# Patient Record
Sex: Male | Born: 1965 | Race: White | Hispanic: No | Marital: Married | Smoking: Former smoker
Health system: Southern US, Community
[De-identification: ages and names within clinical notes are randomized; demographics above are authoritative.]

## PROBLEM LIST (undated history)

## (undated) DIAGNOSIS — R790 Abnormal level of blood mineral: Secondary | ICD-10-CM

## (undated) DIAGNOSIS — K219 Gastro-esophageal reflux disease without esophagitis: Secondary | ICD-10-CM

## (undated) DIAGNOSIS — J309 Allergic rhinitis, unspecified: Secondary | ICD-10-CM

## (undated) DIAGNOSIS — F32A Depression, unspecified: Secondary | ICD-10-CM

## (undated) DIAGNOSIS — A048 Other specified bacterial intestinal infections: Secondary | ICD-10-CM

## (undated) DIAGNOSIS — M79606 Pain in leg, unspecified: Secondary | ICD-10-CM

## (undated) DIAGNOSIS — E119 Type 2 diabetes mellitus without complications: Secondary | ICD-10-CM

## (undated) DIAGNOSIS — E785 Hyperlipidemia, unspecified: Secondary | ICD-10-CM

## (undated) DIAGNOSIS — M542 Cervicalgia: Secondary | ICD-10-CM

## (undated) DIAGNOSIS — E538 Deficiency of other specified B group vitamins: Secondary | ICD-10-CM

## (undated) DIAGNOSIS — I10 Essential (primary) hypertension: Secondary | ICD-10-CM

## (undated) DIAGNOSIS — F329 Major depressive disorder, single episode, unspecified: Secondary | ICD-10-CM

## (undated) DIAGNOSIS — IMO0001 Reserved for inherently not codable concepts without codable children: Secondary | ICD-10-CM

## (undated) DIAGNOSIS — E559 Vitamin D deficiency, unspecified: Secondary | ICD-10-CM

## (undated) DIAGNOSIS — E291 Testicular hypofunction: Secondary | ICD-10-CM

## (undated) HISTORY — DX: Other specified bacterial intestinal infections: A04.8

## (undated) HISTORY — DX: Depression, unspecified: F32.A

## (undated) HISTORY — DX: Deficiency of other specified B group vitamins: E53.8

## (undated) HISTORY — DX: Pain in leg, unspecified: M79.606

## (undated) HISTORY — DX: Abnormal level of blood mineral: R79.0

## (undated) HISTORY — DX: Allergic rhinitis, unspecified: J30.9

## (undated) HISTORY — DX: Vitamin D deficiency, unspecified: E55.9

## (undated) HISTORY — DX: Cervicalgia: M54.2

## (undated) HISTORY — PX: KNEE ARTHROSCOPY: SHX127

## (undated) HISTORY — DX: Essential (primary) hypertension: I10

## (undated) HISTORY — DX: Hyperlipidemia, unspecified: E78.5

## (undated) HISTORY — DX: Testicular hypofunction: E29.1

## (undated) HISTORY — DX: Major depressive disorder, single episode, unspecified: F32.9

## (undated) HISTORY — DX: Gastro-esophageal reflux disease without esophagitis: K21.9

## (undated) HISTORY — DX: Type 2 diabetes mellitus without complications: E11.9

## (undated) HISTORY — DX: Reserved for inherently not codable concepts without codable children: IMO0001

---

## 2000-03-15 ENCOUNTER — Encounter: Admission: RE | Admit: 2000-03-15 | Discharge: 2000-03-15 | Payer: Self-pay | Admitting: Family Medicine

## 2000-03-15 ENCOUNTER — Encounter: Payer: Self-pay | Admitting: Family Medicine

## 2001-01-10 ENCOUNTER — Encounter: Payer: Self-pay | Admitting: Family Medicine

## 2001-01-10 ENCOUNTER — Ambulatory Visit (HOSPITAL_COMMUNITY): Admission: RE | Admit: 2001-01-10 | Discharge: 2001-01-10 | Payer: Self-pay | Admitting: Family Medicine

## 2002-06-09 ENCOUNTER — Encounter: Payer: Self-pay | Admitting: Family Medicine

## 2002-06-09 ENCOUNTER — Encounter: Admission: RE | Admit: 2002-06-09 | Discharge: 2002-06-09 | Payer: Self-pay | Admitting: Family Medicine

## 2002-07-24 ENCOUNTER — Other Ambulatory Visit: Admission: RE | Admit: 2002-07-24 | Discharge: 2002-07-24 | Payer: Self-pay | Admitting: Family Medicine

## 2004-08-11 ENCOUNTER — Ambulatory Visit (HOSPITAL_COMMUNITY): Admission: RE | Admit: 2004-08-11 | Discharge: 2004-08-11 | Payer: Self-pay | Admitting: Orthopedic Surgery

## 2004-08-11 ENCOUNTER — Ambulatory Visit (HOSPITAL_BASED_OUTPATIENT_CLINIC_OR_DEPARTMENT_OTHER): Admission: RE | Admit: 2004-08-11 | Discharge: 2004-08-11 | Payer: Self-pay | Admitting: Orthopedic Surgery

## 2012-03-13 ENCOUNTER — Encounter: Payer: Self-pay | Admitting: *Deleted

## 2012-03-13 DIAGNOSIS — K219 Gastro-esophageal reflux disease without esophagitis: Secondary | ICD-10-CM | POA: Insufficient documentation

## 2012-03-13 DIAGNOSIS — E785 Hyperlipidemia, unspecified: Secondary | ICD-10-CM | POA: Insufficient documentation

## 2012-04-04 ENCOUNTER — Ambulatory Visit: Payer: Self-pay | Admitting: Physician Assistant

## 2012-07-02 ENCOUNTER — Other Ambulatory Visit: Payer: Self-pay | Admitting: Physician Assistant

## 2012-07-04 NOTE — Telephone Encounter (Signed)
Medication refilled per protocol.Patient needs to be seen before any further refills 

## 2012-09-20 ENCOUNTER — Encounter: Payer: Self-pay | Admitting: Family Medicine

## 2012-09-21 ENCOUNTER — Other Ambulatory Visit: Payer: BC Managed Care – PPO

## 2012-09-28 ENCOUNTER — Ambulatory Visit (INDEPENDENT_AMBULATORY_CARE_PROVIDER_SITE_OTHER): Payer: BC Managed Care – PPO | Admitting: Physician Assistant

## 2012-09-28 ENCOUNTER — Encounter: Payer: Self-pay | Admitting: Physician Assistant

## 2012-09-28 VITALS — BP 110/64 | HR 68 | Temp 98.0°F | Resp 16 | Ht 71.0 in | Wt 223.0 lb

## 2012-09-28 DIAGNOSIS — K219 Gastro-esophageal reflux disease without esophagitis: Secondary | ICD-10-CM

## 2012-09-28 DIAGNOSIS — Z0000 Encounter for general adult medical examination without abnormal findings: Secondary | ICD-10-CM

## 2012-09-28 DIAGNOSIS — F32A Depression, unspecified: Secondary | ICD-10-CM | POA: Insufficient documentation

## 2012-09-28 DIAGNOSIS — F329 Major depressive disorder, single episode, unspecified: Secondary | ICD-10-CM

## 2012-09-28 DIAGNOSIS — E119 Type 2 diabetes mellitus without complications: Secondary | ICD-10-CM

## 2012-09-28 DIAGNOSIS — E559 Vitamin D deficiency, unspecified: Secondary | ICD-10-CM

## 2012-09-28 DIAGNOSIS — E785 Hyperlipidemia, unspecified: Secondary | ICD-10-CM

## 2012-09-28 NOTE — Progress Notes (Signed)
Patient ID: Chad Haynes. MRN: [id number], DOB: 12-Apr-1965 47 y.o. Date of Encounter: 09/28/2012, 7:20 PM    Chief Complaint: Physical (CPE)  HPI: 47 y.o. y/o white male here for CPE.  He has no complaints today. Has been feeling pretty good.  He is part of a research trial through [facility]. For his diabetes. This includes a diet of 1500 calories per day. In May of 2014 he may have received what is called Endo Barrier- - or he may have received a placebo-- he does not know. He explains that the Endo Barrier is a sleeve  they put down his esophagus and then it goes into his upper intestine. This prevents calories and sugar from being absorbed. Again he does not know whether he truly got that or placebo.  He is on a 1500-calorie diet. He has lost 32 pounds in less than one year. We don't yet know whether this weight loss is secondary to the calorie diet or that  Plus Endo barrier.  [facility] is managing his diabetes. Therefore I have not been assessing this since he started the research there.  At his lab in September 2013 his LDL was suboptimal his simvastatin was increased to 80 mg. He does state that he is taking this dose and he did increase as directed. He is having no myalgias and no other adverse effects.  He is still taking the Paxil 20 mg daily. Says that his mood is very stable. He has had no problems feeling depressed. He says that he has been on this medication for 12 years.    Review of Systems: Consitutional: No fever, chills, fatigue, night sweats, lymphadenopathy, or weight changes. Eyes: No visual changes, eye redness, or discharge. ENT/Mouth: Ears: No otalgia, tinnitus, hearing loss, discharge. Nose: No congestion, rhinorrhea, sinus pain, or epistaxis. Throat: No sore throat, post nasal drip, or teeth pain. Cardiovascular: No CP, palpitations, diaphoresis, DOE, edema, orthopnea, PND. Respiratory: No cough, hemoptysis, SOB, or wheezing. Gastrointestinal: No anorexia,  dysphagia, reflux, pain, nausea, vomiting, hematemesis, diarrhea, constipation, BRBPR, or melena. Genitourinary: No dysuria, frequency, urgency, hematuria, incontinence, nocturia, decreased urinary stream, discharge, impotence, or testicular pain/masses. Musculoskeletal: No decreased ROM, myalgias, stiffness, joint swelling, or weakness. Skin: No rash, erythema, lesion changes, pain, warmth, jaundice, or pruritis. Neurological: No headache, dizziness, syncope, seizures, tremors, memory loss, coordination problems, or paresthesias. Psychological: No anxiety, depression, hallucinations, SI/HI. Endocrine: No fatigue, polydipsia, polyphagia, polyuria. All other systems were reviewed and are otherwise negative.  Past Medical History  Diagnosis Date  . Rhinitis, allergic   . GERD (gastroesophageal reflux disease)   . White coat hypertension   . Neck pain   . Leg pain   . Diabetes mellitus without complication   . Vitamin D deficiency   . Hyperlipidemia   . Hypogonadism male   . H. pylori infection   . Depression      Past Surgical History  Procedure Laterality Date  . Knee arthroscopy Right     Home Meds:  Current Outpatient Prescriptions on File Prior to Visit  Medication Sig Dispense Refill  . lisinopril (PRINIVIL,ZESTRIL) 5 MG tablet Take 5 mg by mouth daily.      . metFORMIN (GLUCOPHAGE) 1000 MG tablet Take 1,000 mg by mouth 2 (two) times daily with a meal.      . omeprazole-sodium bicarbonate (ZEGERID) 40-1100 MG per capsule Take 1 capsule by mouth daily before breakfast.      . PARoxetine (PAXIL) 20 MG tablet TAKE ONE TABLET  BY MOUTH EVERY DAY  90 tablet  0  . simvastatin (ZOCOR) 80 MG tablet Take 80 mg by mouth at bedtime.       No current facility-administered medications on file prior to visit.    Allergies: No Known Allergies  History   Social History  . Marital Status: Married    Spouse Name: N/A    Number of Children: N/A  . Years of Education: N/A    Occupational History  . Not on file.   Social History Main Topics  . Smoking status: Never Smoker   . Smokeless tobacco: Not on file  . Alcohol Use: No  . Drug Use: Not on file  . Sexual Activity: Not on file   Other Topics Concern  . Not on file   Social History Narrative   Married. No children. Does not formal exercise.   In school at [employer] full-time. Works part-time job at night.   "Has to walk a mile from the car to classic [employer]"    Family History  Problem Relation Age of Onset  . COPD Mother     smoker  . Hypertension Father     Physical Exam: Blood pressure 110/64, pulse 68, temperature 98 F (36.7 C), temperature source Oral, resp. rate 16, height 5\' 11"  (1.803 m), weight 223 lb (101.152 kg).  General: Well developed, well nourished, white male. in no acute distress. HEENT: Normocephalic, atraumatic. Conjunctiva pink, sclera non-icteric. Pupils 2 mm constricting to 1 mm, round, regular, and equally reactive to light and accomodation. EOMI. Internal auditory canal clear. TMs with good cone of light and without pathology. Nasal mucosa pink. Nares are without discharge. No sinus tenderness. Oral mucosa pink.  Pharynx without exudate.   Neck: Supple. Trachea midline. No thyromegaly. Full ROM. No lymphadenopathy. No carotid bruit. Lungs: Clear to auscultation bilaterally without wheezes, rales, or rhonchi. Breathing is of normal effort and unlabored. Cardiovascular: RRR with S1 S2. No murmurs, rubs, or gallops. Distal pulses 2+ symmetrically. No carotid or abdominal bruits. Abdomen: Soft, non-tender, non-distended with normoactive bowel sounds. No hepatosplenomegaly or masses. No rebound/guarding. No CVA tenderness. No hernias. Musculoskeletal: Full range of motion and 5/5 strength throughout. Without swelling, atrophy, tenderness, crepitus, or warmth. Extremities without clubbing, cyanosis, or edema. Calves supple. Skin: Warm and moist without erythema, ecchymosis, wounds,  or rash. Neuro: A+Ox3. CN II-XII grossly intact. Moves all extremities spontaneously. Full sensation throughout. Normal gait. DTR 2+ throughout upper and lower extremities. Finger to nose intact. Psych:  Responds to questions appropriately with a normal affect.   Assessment/Plan:  47 y.o. y/o white male here for CPE -1. Visit for preventive health examination A. Labs: On 07/08/12 he had labs through [facility]. This included CMET which was normal. Lipid panel, vitamin B12, vitamin D and A1c  2. Diabetes mellitus without complication Being managed at [facility] secondary to research trial  3. Hyperlipidemia September 13 LDL was suboptimal simvastatin was increased to 80 mg. Lipid panel on 07/08/12 showed: Total cholesterol 98 HDL 29 LDL 56 triglyceride 101. In addition to increasing simvastatin to 80 mg the patient has also lost significant weight as above secondary to his low-calorie diet.(+/- EndoBarrier). If he is able to continue this diet long term then we can probably decrease his simvastatin dose in the future.  4. Depression Well-controlled on current medication  5. GERD (gastroesophageal reflux disease) Symptoms are controlled  6. Unspecified vitamin D deficiency He is on vitamin D   A. Screening Labs:  B. Screening For Prostate  Cancer: We'll start at age 61  C. Screening For Colorectal Cancer: We'll start at age 10  D. Immunizations: Flu----patient deferred Tetanus--this should have been updated with his surgery recently. As well he said he had a finger laceration 4 years ago and as it was updated at that time. Pneumococcal:  We'll discuss this with patient at the next office visit. Given that he has diabetes he should go ahead and have a DTaP. However not sure if this has been given a [facility]. Zostaax: At age 64  Signed:   [address] [place], [place]  09/28/2012 7:20 PM

## 2012-10-12 ENCOUNTER — Other Ambulatory Visit: Payer: Self-pay | Admitting: Physician Assistant

## 2012-10-12 NOTE — Telephone Encounter (Signed)
Medication refilled per protocol. 

## 2012-11-15 ENCOUNTER — Telehealth: Payer: Self-pay | Admitting: *Deleted

## 2012-11-15 DIAGNOSIS — E119 Type 2 diabetes mellitus without complications: Secondary | ICD-10-CM

## 2012-11-15 MED ORDER — METFORMIN HCL 1000 MG PO TABS: 1000.0000 mg | ORAL_TABLET | Freq: Two times a day (BID) | ORAL | Status: DC

## 2012-11-15 NOTE — Telephone Encounter (Signed)
Metformin refill

## 2012-12-04 ENCOUNTER — Encounter: Payer: Self-pay | Admitting: Family Medicine

## 2012-12-04 DIAGNOSIS — R790 Abnormal level of blood mineral: Secondary | ICD-10-CM

## 2012-12-04 DIAGNOSIS — E538 Deficiency of other specified B group vitamins: Secondary | ICD-10-CM | POA: Insufficient documentation

## 2012-12-13 ENCOUNTER — Telehealth: Payer: Self-pay | Admitting: Family Medicine

## 2012-12-13 MED ORDER — SIMVASTATIN 80 MG PO TABS: 80.0000 mg | ORAL_TABLET | Freq: Every day | ORAL | Status: DC

## 2012-12-13 NOTE — Telephone Encounter (Signed)
zocor refill

## 2012-12-15 ENCOUNTER — Telehealth: Payer: Self-pay | Admitting: Physician Assistant

## 2012-12-15 NOTE — Telephone Encounter (Signed)
PT wife is calling about her husband prescription she [facility] not [facility] us but he is out of his medication  [facility] [place]  Call back number is [phone]

## 2012-12-16 ENCOUNTER — Ambulatory Visit: Payer: BC Managed Care – PPO

## 2013-01-04 ENCOUNTER — Other Ambulatory Visit: Payer: Self-pay | Admitting: Family Medicine

## 2013-01-06 NOTE — Telephone Encounter (Signed)
Medication refilled per protocol. 

## 2013-01-16 ENCOUNTER — Telehealth: Payer: Self-pay | Admitting: Physician Assistant

## 2013-01-16 MED ORDER — LISINOPRIL 5 MG PO TABS: 5.0000 mg | ORAL_TABLET | Freq: Every day | ORAL | Status: DC

## 2013-01-16 MED ORDER — GLIMEPIRIDE 4 MG PO TABS: 8.0000 mg | ORAL_TABLET | Freq: Every day | ORAL | Status: DC

## 2013-01-16 NOTE — Telephone Encounter (Signed)
Medication refilled per protocol. 

## 2013-01-16 NOTE — Telephone Encounter (Signed)
Pharmacy [facility] [address] back number is [phone] Pt is needing a refill on lisinopril, and glimepiride

## 2013-01-25 ENCOUNTER — Encounter: Payer: Self-pay | Admitting: Physician Assistant

## 2013-01-25 ENCOUNTER — Ambulatory Visit (INDEPENDENT_AMBULATORY_CARE_PROVIDER_SITE_OTHER): Payer: BC Managed Care – PPO | Admitting: Physician Assistant

## 2013-01-25 VITALS — BP 118/70 | HR 100 | Temp 98.6°F | Resp 20 | Wt 222.0 lb

## 2013-01-25 DIAGNOSIS — D518 Other vitamin B12 deficiency anemias: Secondary | ICD-10-CM

## 2013-01-25 DIAGNOSIS — R531 Weakness: Secondary | ICD-10-CM

## 2013-01-25 DIAGNOSIS — R5383 Other fatigue: Secondary | ICD-10-CM

## 2013-01-25 DIAGNOSIS — D519 Vitamin B12 deficiency anemia, unspecified: Secondary | ICD-10-CM

## 2013-01-25 DIAGNOSIS — R5381 Other malaise: Secondary | ICD-10-CM

## 2013-01-25 DIAGNOSIS — R509 Fever, unspecified: Secondary | ICD-10-CM

## 2013-01-25 LAB — CBC W/MCH & 3 PART DIFF
HCT: 38.2 % — ABNORMAL LOW (ref 39.0–52.0)
Hemoglobin: 12.3 g/dL — ABNORMAL LOW (ref 13.0–17.0)
LYMPHS ABS: 1.2 10*3/uL (ref 0.7–4.0)
LYMPHS PCT: 9 % — AB (ref 12–46)
MCH: 28 pg (ref 26.0–34.0)
MCHC: 32.2 g/dL (ref 30.0–36.0)
MCV: 86.8 fL (ref 78.0–100.0)
NEUTROS PCT: 87 % — AB (ref 43–77)
Neutro Abs: 12.1 10*3/uL — ABNORMAL HIGH (ref 1.7–7.7)
PLATELETS: 419 10*3/uL — AB (ref 150–400)
RBC: 4.4 MIL/uL (ref 4.22–5.81)
RDW: 14.4 % (ref 11.5–15.5)
WBC mixed population %: 5 % (ref 3–18)
WBC mixed population: 0.6 10*3/uL (ref 0.1–1.8)
WBC: 13.9 10*3/uL — AB (ref 4.0–10.5)

## 2013-01-25 LAB — INFLUENZA A AND B
INFLUENZA B AG: NEGATIVE
Inflenza A Ag: NEGATIVE

## 2013-01-25 MED ORDER — CYANOCOBALAMIN 1000 MCG/ML IJ SOLN
1000.0000 ug | Freq: Once | INTRAMUSCULAR | Status: AC
  Administered 2013-01-25: 1000 ug via INTRAMUSCULAR

## 2013-01-25 NOTE — Progress Notes (Signed)
Patient ID: Chad Haynes. MRN: [id number], DOB: 02-06-1965, 48 y.o. Date of Encounter: 01/25/2013, 2:59 PM    Chief Complaint:  Chief Complaint  Patient presents with  . poss anemic from implant    req CBC,  been sick with cold sx off/on, stomach pain, fever 3 weeks  . Injections    B12     HPI: 48 y.o. year old white male is here with his wife. Chad Haynes has been in a research study at [facility] for quite some time now. They have put in something called an EndoBarrier--for Diabetes.  Patient has called to [facility] and is informed them of his recent symptoms and illness. They told him to come here. They are to then call [facility] with the results found here and then may be also following up at [facility] today or tomorrow. [facility] has been doing blood work on a frequent basis as part of the research study. Patient and wife report that this has shown B12 deficiency in the past and he was supposed to be coming here to get B12 injections. However he had totally forgotten about coming for the injections and has not been getting the B12 injections. He says that he definitely needs to get a B12 injection while he is here today. As well, and he does not know whether the B12 deficiency is contributing to his current symptoms. He reports that he has been sick off and on for several weeks now.  Specifically, they report that he has had sweats at night versus the past 3 nights. Last night the wife checked his temperature and got 103.1. He states that he is just feeling tired and weak and just does not feel well in general.  He had some sinus congestion and cough a couple weeks ago but that has completely cleared and resolved. He also reports that he has had some vague abdominal discomfort for months but he thinks this is secondary to the [employer]	[place]. He has had no increased or changed abdominal pain over the past week. They report that [facility] told him to  come here and get a CBC and a flu test and his B12 injection. They are to  then call [facility] and report the findings of these to them and then followup with [facility].     Home Meds: See attached medication section for any medications that were entered at today's visit. The computer does not put those onto this list.The following list is a list of meds entered prior to today's visit.   Current Outpatient Prescriptions on File Prior to Visit  Medication Sig Dispense Refill  . Ascorbic Acid (VITAMIN C) 100 MG tablet Take 100 mg by mouth daily.      . Cholecalciferol (VITAMIN D) 400 UNITS capsule Take 400 Units by mouth daily.      . ferrous fumarate (HEMOCYTE - 106 MG FE) 325 (106 FE) MG TABS tablet Take 1 tablet by mouth.      Marland Kitchen lisinopril (PRINIVIL,ZESTRIL) 5 MG tablet Take 1 tablet (5 mg total) by mouth daily.  90 tablet  1  . PARoxetine (PAXIL) 20 MG tablet Take 1 tablet (20 mg total) by mouth daily.  90 tablet  1  . simvastatin (ZOCOR) 80 MG tablet Take 1 tablet (80 mg total) by mouth at bedtime.  90 tablet  1  . vitamin B-12 (CYANOCOBALAMIN) 100 MCG tablet Take 50 mcg by mouth daily.      . Magnesium Oxide 400 MG CAPS Take 800  mg by mouth daily.       No current facility-administered medications on file prior to visit.    Allergies: No Known Allergies    Review of Systems: See HPI for pertinent ROS. All other ROS negative.    Physical Exam: Blood pressure 118/70, pulse 100, temperature 98.6 F (37 C), temperature source Oral, resp. rate 20, weight 222 lb (100.699 kg)., Body mass index is 30.98 kg/(m^2). General: WNWD WM.  Appears in no acute distress. HEENT: Normocephalic, atraumatic, eyes without discharge, sclera non-icteric, nares are without discharge. Bilateral auditory canals clear, TM's are without perforation, pearly grey and translucent with reflective cone of light bilaterally. Oral cavity moist, posterior pharynx without exudate, erythema, peritonsillar abscess, or post nasal drip.  Neck: Supple. No thyromegaly. No lymphadenopathy. Lungs: Clear  bilaterally to auscultation without wheezes, rales, or rhonchi. Breathing is unlabored. Heart: Regular rhythm. No murmurs, rubs, or gallops. Abdomen: Soft,  non-distended with normoactive bowel sounds. No hepatomegaly. No rebound/guarding. No obvious abdominal masses. Mild diffuse tenderness but no areas of focal localized severe tenderness. Msk:  Strength and tone normal for age. Extremities/Skin: Warm and dry. No clubbing or cyanosis. No edema. No rashes or suspicious lesions. Neuro: Alert and oriented X 3. Moves all extremities spontaneously. Gait is normal. CNII-XII grossly in tact. Psych:  Responds to questions appropriately with a normal affect.   Results for orders placed in visit on 01/25/13  INFLUENZA A AND B      Result Value Range   Source-INFBD NASAL     Inflenza A Ag NEG  Negative   Influenza B Ag NEG  Negative  CBC [facility] & 3 PART DIFF      Result Value Range   WBC 13.9 (*) 4.0 - 10.5 K/uL   RBC 4.40  4.22 - 5.81 MIL/uL   Hemoglobin 12.3 (*) 13.0 - 17.0 g/dL   HCT 38.2 (*) 39.0 - 52.0 %   MCV 86.8  78.0 - 100.0 fL   MCH 28.0  26.0 - 34.0 pg   MCHC 32.2  30.0 - 36.0 g/dL   RDW 14.4  11.5 - 15.5 %   Platelets 419 (*) 150 - 400 K/uL   Neutrophils Relative % 87 (*) 43 - 77 %   Neutro Abs 12.1 (*) 1.7 - 7.7 K/uL   Lymphocytes Relative 9 (*) 12 - 46 %   Lymphs Abs 1.2  0.7 - 4.0 K/uL   WBC mixed population % 5  3 - 18 %   WBC mixed population 0.6  0.1 - 1.8 K/uL     ASSESSMENT AND PLAN:  48 y.o. year old male with  1. Fever - Influenza a and b - CBC w/[facility] & 3 Part Diff  2. Weakness - Influenza a and b - CBC w/[facility] & 3 Part Diff  3. Fatigue - Influenza a and b - CBC w/[facility] & 3 Part Diff  4. B12 deficiency anemia We'll find documentation of this B12 deficiency and then proceed with injection.  I discussed lab results with patient and his wife. I discussed need for evaluating his symptoms and CBC findings. However, because of his research study, they plan to  follow up with [facility]. Patient's wife has already called [facility] on her cellphone from the office and has plans for followup with them.   Marin Olp [place], [place], [facility] 01/25/2013 2:59 PM

## 2013-05-16 ENCOUNTER — Other Ambulatory Visit: Payer: Self-pay | Admitting: Physician Assistant

## 2013-05-16 NOTE — Telephone Encounter (Signed)
Refill appropriate and filled per protocol. 

## 2013-05-22 ENCOUNTER — Telehealth: Payer: Self-pay | Admitting: Physician Assistant

## 2013-05-22 MED ORDER — SIMVASTATIN 80 MG PO TABS: 80.0000 mg | ORAL_TABLET | Freq: Every day | ORAL | Status: DC

## 2013-05-22 MED ORDER — LISINOPRIL 5 MG PO TABS: 5.0000 mg | ORAL_TABLET | Freq: Every day | ORAL | Status: DC

## 2013-05-22 MED ORDER — PAROXETINE HCL 20 MG PO TABS: 20.0000 mg | ORAL_TABLET | Freq: Every day | ORAL | Status: DC

## 2013-05-22 NOTE — Telephone Encounter (Signed)
Medication refilled per protocol. 

## 2013-05-22 NOTE — Telephone Encounter (Signed)
PATIENT IS CALLING TO GET REFILLS ON HIS ZOCOR,PAXIL AND LISINOPRIL, AND HE WOULD LIKE TO CHANGE PHARMACY TO [facility] SAYS THAT [facility] RUN AROUND  [phone]

## 2013-07-22 ENCOUNTER — Other Ambulatory Visit: Payer: Self-pay | Admitting: *Deleted

## 2013-07-22 DIAGNOSIS — E119 Type 2 diabetes mellitus without complications: Secondary | ICD-10-CM

## 2013-07-22 DIAGNOSIS — E785 Hyperlipidemia, unspecified: Secondary | ICD-10-CM

## 2013-07-27 ENCOUNTER — Other Ambulatory Visit: Payer: BC Managed Care – PPO

## 2013-07-27 DIAGNOSIS — E119 Type 2 diabetes mellitus without complications: Secondary | ICD-10-CM

## 2013-07-27 DIAGNOSIS — E785 Hyperlipidemia, unspecified: Secondary | ICD-10-CM

## 2013-07-27 LAB — CBC WITH DIFFERENTIAL/PLATELET
Basophils Absolute: 0 10*3/uL (ref 0.0–0.1)
Basophils Relative: 0 % (ref 0–1)
EOS ABS: 0.5 10*3/uL (ref 0.0–0.7)
Eosinophils Relative: 6 % — ABNORMAL HIGH (ref 0–5)
HCT: 41.5 % (ref 39.0–52.0)
Hemoglobin: 14.7 g/dL (ref 13.0–17.0)
LYMPHS PCT: 24 % (ref 12–46)
Lymphs Abs: 1.8 10*3/uL (ref 0.7–4.0)
MCH: 29.5 pg (ref 26.0–34.0)
MCHC: 35.4 g/dL (ref 30.0–36.0)
MCV: 83.2 fL (ref 78.0–100.0)
Monocytes Absolute: 0.5 10*3/uL (ref 0.1–1.0)
Monocytes Relative: 7 % (ref 3–12)
Neutro Abs: 4.8 10*3/uL (ref 1.7–7.7)
Neutrophils Relative %: 63 % (ref 43–77)
PLATELETS: 303 10*3/uL (ref 150–400)
RBC: 4.99 MIL/uL (ref 4.22–5.81)
RDW: 13.9 % (ref 11.5–15.5)
WBC: 7.6 10*3/uL (ref 4.0–10.5)

## 2013-07-27 LAB — COMPLETE METABOLIC PANEL WITH GFR
ALK PHOS: 94 U/L (ref 39–117)
ALT: 36 U/L (ref 0–53)
AST: 18 U/L (ref 0–37)
Albumin: 4.3 g/dL (ref 3.5–5.2)
BILIRUBIN TOTAL: 0.7 mg/dL (ref 0.2–1.2)
BUN: 17 mg/dL (ref 6–23)
CHLORIDE: 102 meq/L (ref 96–112)
CO2: 23 mEq/L (ref 19–32)
CREATININE: 0.72 mg/dL (ref 0.50–1.35)
Calcium: 9.2 mg/dL (ref 8.4–10.5)
GFR, Est African American: >89 mL/min
GFR, Est Non African American: >89 mL/min
Glucose, Bld: 158 mg/dL — ABNORMAL HIGH (ref 70–99)
Potassium: 4.5 mEq/L (ref 3.5–5.3)
Sodium: 138 mEq/L (ref 135–145)
Total Protein: 6.9 g/dL (ref 6.0–8.3)

## 2013-07-27 LAB — LIPID PANEL
Cholesterol: 136 mg/dL (ref 0–200)
HDL: 37 mg/dL — AB (ref >39)
LDL Cholesterol: 70 mg/dL (ref 0–99)
TRIGLYCERIDES: 145 mg/dL (ref <150)
Total CHOL/HDL Ratio: 3.7 Ratio
VLDL: 29 mg/dL (ref 0–40)

## 2013-07-29 LAB — HEMOGLOBIN A1C
Hgb A1c MFr Bld: 7.4 % — ABNORMAL HIGH (ref <5.7)
MEAN PLASMA GLUCOSE: 166 mg/dL — AB (ref <117)

## 2013-08-27 ENCOUNTER — Other Ambulatory Visit: Payer: Self-pay | Admitting: Physician Assistant

## 2013-08-28 NOTE — Telephone Encounter (Signed)
Refill appropriate and filled per protocol. 

## 2013-10-02 ENCOUNTER — Encounter: Payer: BC Managed Care – PPO | Admitting: Physician Assistant

## 2013-10-04 ENCOUNTER — Encounter: Payer: BC Managed Care – PPO | Admitting: Physician Assistant

## 2013-10-16 ENCOUNTER — Encounter: Payer: BC Managed Care – PPO | Admitting: Physician Assistant

## 2013-11-22 ENCOUNTER — Other Ambulatory Visit: Payer: Self-pay | Admitting: Family Medicine

## 2013-11-22 ENCOUNTER — Other Ambulatory Visit: Payer: Self-pay | Admitting: Physician Assistant

## 2013-11-22 ENCOUNTER — Encounter: Payer: Self-pay | Admitting: Family Medicine

## 2013-11-22 MED ORDER — METFORMIN HCL 1000 MG PO TABS: 1000.0000 mg | ORAL_TABLET | Freq: Two times a day (BID) | ORAL | Status: DC

## 2013-11-22 NOTE — Telephone Encounter (Signed)
Medication refill for one time only.  Patient needs to be seen.  Letter sent for patient to call and schedule 

## 2013-12-20 ENCOUNTER — Encounter: Payer: Self-pay | Admitting: Family Medicine

## 2013-12-20 ENCOUNTER — Other Ambulatory Visit: Payer: Self-pay | Admitting: Physician Assistant

## 2013-12-20 NOTE — Telephone Encounter (Signed)
Medication refill for one time only.  Patient needs to be seen.  Letter sent for patient to call and schedule 

## 2014-01-01 ENCOUNTER — Ambulatory Visit: Payer: BC Managed Care – PPO | Admitting: Physician Assistant

## 2014-01-17 ENCOUNTER — Ambulatory Visit (INDEPENDENT_AMBULATORY_CARE_PROVIDER_SITE_OTHER): Payer: BLUE CROSS/BLUE SHIELD | Admitting: Physician Assistant

## 2014-01-17 ENCOUNTER — Encounter: Payer: Self-pay | Admitting: Physician Assistant

## 2014-01-17 VITALS — BP 116/86 | HR 88 | Temp 98.2°F | Resp 18 | Ht 70.5 in | Wt 249.0 lb

## 2014-01-17 DIAGNOSIS — Z23 Encounter for immunization: Secondary | ICD-10-CM | POA: Diagnosis not present

## 2014-01-17 DIAGNOSIS — F329 Major depressive disorder, single episode, unspecified: Secondary | ICD-10-CM

## 2014-01-17 DIAGNOSIS — E785 Hyperlipidemia, unspecified: Secondary | ICD-10-CM | POA: Diagnosis not present

## 2014-01-17 DIAGNOSIS — E559 Vitamin D deficiency, unspecified: Secondary | ICD-10-CM | POA: Diagnosis not present

## 2014-01-17 DIAGNOSIS — E119 Type 2 diabetes mellitus without complications: Secondary | ICD-10-CM | POA: Diagnosis not present

## 2014-01-17 DIAGNOSIS — K219 Gastro-esophageal reflux disease without esophagitis: Secondary | ICD-10-CM | POA: Diagnosis not present

## 2014-01-17 DIAGNOSIS — F32A Depression, unspecified: Secondary | ICD-10-CM

## 2014-01-17 LAB — LIPID PANEL
CHOLESTEROL: 143 mg/dL (ref 0–200)
HDL: 35 mg/dL — ABNORMAL LOW (ref >39)
LDL Cholesterol: 80 mg/dL (ref 0–99)
Total CHOL/HDL Ratio: 4.1 Ratio
Triglycerides: 140 mg/dL (ref <150)
VLDL: 28 mg/dL (ref 0–40)

## 2014-01-17 LAB — COMPLETE METABOLIC PANEL WITH GFR
ALT: 24 U/L (ref 0–53)
AST: 18 U/L (ref 0–37)
Albumin: 4.2 g/dL (ref 3.5–5.2)
Alkaline Phosphatase: 99 U/L (ref 39–117)
BILIRUBIN TOTAL: 0.7 mg/dL (ref 0.2–1.2)
BUN: 16 mg/dL (ref 6–23)
CALCIUM: 9.4 mg/dL (ref 8.4–10.5)
CO2: 22 mEq/L (ref 19–32)
Chloride: 102 mEq/L (ref 96–112)
Creat: 0.61 mg/dL (ref 0.50–1.35)
GLUCOSE: 197 mg/dL — AB (ref 70–99)
POTASSIUM: 4.5 meq/L (ref 3.5–5.3)
SODIUM: 138 meq/L (ref 135–145)
Total Protein: 7.3 g/dL (ref 6.0–8.3)

## 2014-01-17 NOTE — Progress Notes (Signed)
Patient ID: Chad Haynes. MRN: [id number], DOB: 1965-11-03 49 y.o. Date of Encounter: 01/17/2014, 9:57 AM    Chief Complaint: Routine follow-up office visit  HPI: 49 y.o. y/o white male here for routine follow-up office visit.    He has no complaints today. Has been feeling pretty good.  He wa part of a research trial through [facility]. It was research for what is called Endo Barrier. Was researching this as a possible treatment option for Diabetes.  He had explained to me  that the Endo Barrier is a sleeve  they put down his esophagus and then it goes into his upper intestine. This prevents calories and sugar from being absorbed. He was on a 1500-calorie diet. He did lose weight with this but we didn't  know whether this weight loss was secondary to the calorie diet or that  Plus Endo barrier.  His last office visit with me was 01/25/13. At that time he came in because of fever and other symptoms. 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  His last visit with me prior to that was for physical exam 09/28/12. Even at that time I was not evaluating his diabetes because that was being done as part of the research at [facility]. However, today patient states that he is still taking all of the same medications for diabetes as he was when I was last prescribing medications.  He is taking diabetic medications and cholesterol medications etc. as listed on the medicine list today. He is having no adverse effects. He is still taking the Paxil 20 mg daily. Says that his mood is very stable. He has had no problems feeling depressed. He says that he has been on this medication for 14 years.  He says that he does not check his blood sugar. Says that he tries to be pretty careful with his diet. Says that he and his wife joined [employer] about one month  ago and they goes there 3 or 4 times per week and he does the elliptical and the bike and a little bit of weights.    Review of Systems: Consitutional: No fever, chills, fatigue, night sweats, lymphadenopathy, or weight changes. Eyes: No visual changes, eye redness, or discharge. ENT/Mouth: Ears: No otalgia, tinnitus, hearing loss, discharge. Nose: No congestion, rhinorrhea, sinus pain, or epistaxis. Throat: No sore throat, post nasal drip, or teeth pain. Cardiovascular: No CP, palpitations, diaphoresis, DOE, edema, orthopnea, PND. Respiratory: No cough, hemoptysis, SOB, or wheezing. Gastrointestinal: No anorexia, dysphagia, reflux, pain, nausea, vomiting, hematemesis, diarrhea, constipation, BRBPR, or melena. Genitourinary: No dysuria, frequency, urgency, hematuria, incontinence, nocturia, decreased urinary stream, discharge, impotence, or testicular pain/masses. Musculoskeletal: No decreased ROM, myalgias, stiffness, joint swelling, or weakness. Skin: No rash, erythema, lesion changes, pain, warmth, jaundice, or pruritis. Neurological: No headache, dizziness, syncope, seizures, tremors, memory loss, coordination problems, or paresthesias. Psychological: No anxiety, depression, hallucinations, SI/HI. Endocrine: No fatigue, polydipsia, polyphagia, polyuria. All other systems were reviewed and are otherwise negative.  Past Medical History  Diagnosis Date  . Rhinitis, allergic   . GERD (gastroesophageal reflux disease)   . White coat hypertension   . Neck pain   . Leg pain   . Diabetes mellitus without complication   . Vitamin D deficiency   . Hyperlipidemia   . Hypogonadism male   . H. pylori infection   .  Depression   . Low magnesium levels   . Vitamin B12 deficiency      Past Surgical History  Procedure Laterality Date  . Knee arthroscopy Right     Home Meds:  Current Outpatient Prescriptions on File Prior to Visit  Medication Sig Dispense Refill  . Ascorbic Acid (VITAMIN  C) 100 MG tablet Take 100 mg by mouth daily.    . Cholecalciferol (VITAMIN D) 400 UNITS capsule Take 400 Units by mouth daily.    Marland Kitchen glimepiride (AMARYL) 2 MG tablet TAKE 1 TABLET EVERY DAY 30 tablet 3  . lisinopril (PRINIVIL,ZESTRIL) 5 MG tablet TAKE 1 TABLET (5 MG TOTAL) BY MOUTH DAILY. 30 tablet 0  . metFORMIN (GLUCOPHAGE) 1000 MG tablet Take 1 tablet (1,000 mg total) by mouth 2 (two) times daily with a meal. 60 tablet 0  . PARoxetine (PAXIL) 20 MG tablet TAKE 1 TABLET (20 MG TOTAL) BY MOUTH DAILY. 30 tablet 0  . simvastatin (ZOCOR) 80 MG tablet TAKE 1 TABLET (80 MG TOTAL) BY MOUTH AT BEDTIME. 30 tablet 0   No current facility-administered medications on file prior to visit.    Allergies: No Known Allergies  History   Social History  . Marital Status: Married    Spouse Name: N/A    Number of Children: N/A  . Years of Education: N/A   Occupational History  . Not on file.   Social History Main Topics  . Smoking status: Never Smoker   . Smokeless tobacco: Not on file  . Alcohol Use: No  . Drug Use: Not on file  . Sexual Activity: Not on file   Other Topics Concern  . Not on file   Social History Narrative   Married. No children. Does not formal exercise.   In school at [employer] full-time. Works part-time job at night.   "Has to walk a mile from the car to classic [place]"    Family History  Problem Relation Age of Onset  . COPD Mother     smoker  . Hypertension Father     Physical Exam: Blood pressure 116/86, pulse 88, temperature 98.2 F (36.8 C), temperature source Oral, resp. rate 18, height 5' 10.5" (1.791 m), weight 249 lb (112.946 kg).  General: Well developed, well nourished, white male. in no acute distress. HEENT: Normocephalic, atraumatic. Conjunctiva pink, sclera non-icteric. Pupils 2 mm constricting to 1 mm, round, regular, and equally reactive to light and accomodation. EOMI. Internal auditory canal clear. TMs with good cone of light and without pathology.  Nasal mucosa pink. Nares are without discharge. No sinus tenderness. Oral mucosa pink.  Pharynx without exudate.   Neck: Supple. Trachea midline. No thyromegaly. Full ROM. No lymphadenopathy. No carotid bruit. Lungs: Clear to auscultation bilaterally without wheezes, rales, or rhonchi. Breathing is of normal effort and unlabored. Cardiovascular: RRR with S1 S2. No murmurs, rubs, or gallops. Distal pulses 2+ symmetrically. No carotid or abdominal bruits. Abdomen: Soft, non-tender, non-distended with normoactive bowel sounds. No hepatosplenomegaly or masses. No rebound/guarding. No CVA tenderness. No hernias. Musculoskeletal: Full range of motion and 5/5 strength throughout. Without swelling, atrophy, tenderness, crepitus, or warmth. Extremities without clubbing, cyanosis, or edema. Calves supple. Skin: Warm and moist without erythema, ecchymosis, wounds, or rash. Neuro: A+Ox3. CN II-XII grossly intact. Moves all extremities spontaneously. Full sensation throughout. Normal gait. DTR 2+ throughout upper and lower extremities. Finger to nose intact. Psych:  Responds to questions appropriately with a normal affect.   Assessment/Plan:  49 y.o. y/o white male here  for   1. Diabetes mellitus without complication - COMPLETE METABOLIC PANEL WITH GFR - Hemoglobin A1c  NEEDS MICROALBUMIN AT NEXT OV  Pneumovax 23 given here 01/17/2014  He is on ACE inhibitor He is on statin  NOT ON ASPIRIN---NEED TO DISCUSS ASA AT NEXT OV  2. Hyperlipidemia He is on statin. Recheck lab now. - COMPLETE METABOLIC PANEL WITH GFR - Lipid panel  3. Gastroesophageal reflux disease, esophagitis presence not specified - COMPLETE METABOLIC PANEL WITH GFR  4. Depression - COMPLETE METABOLIC PANEL WITH GFR  5. Vitamin D deficiency - COMPLETE METABOLIC PANEL WITH GFR - Vit D  25 hydroxy (rtn osteoporosis monitoring)  6. Need for prophylactic vaccination against Streptococcus pneumoniae (pneumococcus) - Pneumococcal  polysaccharide vaccine 23-valent greater than or equal to 2yo subcutaneous/IM    HE HAD COMPLETE PHYSICAL EXAM WITH ME 09/28/2012. THE FOLLOWING IS COPIED FROM THAT NOTE:   A. Screening Labs:  B. Screening For Prostate Cancer: We'll start at age 51  C. Screening For Colorectal Cancer: We'll start at age 73  D. Immunizations: Flu----patient deferred Tetanus--He reports he had a  finger laceration in approx year 2000--Tetanus was updated at that time--per pt. Pneumococcal:  Reports that he was given no pneumonia vaccine at [facility].Given his diabetes he needs to have Pneumovax 23. He is agreeable to receive this so this was given here 01/17/2014 Zostaax: At age 5  Signed:   [address] [place], [place]  01/17/2014 9:57 AM

## 2014-01-18 LAB — HEMOGLOBIN A1C
Hgb A1c MFr Bld: 8.3 % — ABNORMAL HIGH (ref <5.7)
Mean Plasma Glucose: 192 mg/dL — ABNORMAL HIGH (ref <117)

## 2014-01-18 LAB — VITAMIN D 25 HYDROXY (VIT D DEFICIENCY, FRACTURES): Vit D, 25-Hydroxy: 24 ng/mL — ABNORMAL LOW (ref 30–100)

## 2014-01-19 ENCOUNTER — Telehealth: Payer: Self-pay | Admitting: Family Medicine

## 2014-01-19 MED ORDER — PIOGLITAZONE HCL 45 MG PO TABS: 45.0000 mg | ORAL_TABLET | Freq: Every day | ORAL | Status: DC

## 2014-01-19 NOTE — Telephone Encounter (Signed)
-----   Message from Orlena Sheldon, PA-C sent at 01/18/2014  7:41 AM EST ----- 1--add Actos 45 mg 1 by mouth daily--- just give #30+2 refills so that he will come back in 3 months. 2--- increase vitamin D to 2000 units daily 3--- make sure to have follow-up office visit in 3 months. Do not wait longer than this so that we can closely monitor and adjust this.

## 2014-01-19 NOTE — Telephone Encounter (Signed)
Pt aware of lab results and provider recommendations.  Vitamin D change to med list.  Actos to pharmacy.  Pt has 3 month visit and told KEEP appointment

## 2014-01-23 LAB — HM DIABETES EYE EXAM

## 2014-01-26 ENCOUNTER — Other Ambulatory Visit: Payer: Self-pay | Admitting: Physician Assistant

## 2014-01-26 NOTE — Telephone Encounter (Signed)
Medication refilled per protocol. 

## 2014-01-28 ENCOUNTER — Other Ambulatory Visit: Payer: Self-pay | Admitting: Physician Assistant

## 2014-01-29 NOTE — Telephone Encounter (Signed)
Medication refilled per protocol. 

## 2014-01-30 ENCOUNTER — Telehealth: Payer: Self-pay | Admitting: Family Medicine

## 2014-01-30 NOTE — Telephone Encounter (Signed)
Wife wanted to clarify new medication changes from last visit.  All medications clarified with her and questions answered

## 2014-02-05 ENCOUNTER — Encounter: Payer: Self-pay | Admitting: Family Medicine

## 2014-03-04 ENCOUNTER — Other Ambulatory Visit: Payer: Self-pay | Admitting: Physician Assistant

## 2014-03-05 NOTE — Telephone Encounter (Signed)
Discuss with patient. He had been in a trial at [facility] and was having his diabetes managed at [facility] for a long time. He has had one office visit with me since he has been out of that trial--and I am now the one managing his diabetes. Talk to patient and find out how he is currently dosing this medication.

## 2014-03-05 NOTE — Telephone Encounter (Signed)
Have left pt message to call back to clarify dose of glimepiride

## 2014-03-05 NOTE — Telephone Encounter (Signed)
We have glimepiride 2 mg daily on med list.  This is for Glimepiride 4 mg "two" tabs a day????

## 2014-03-07 NOTE — Telephone Encounter (Signed)
I called patient.  He was confused also.  States he is only taking one Glimepiride a day.  He not sure of strength.  Told him to call me back when he can look at bottle he has at home.  He said may have thrown is away already bit will look.  LRF of the 2 mg Glimepiride was 08/28/13 #30 + 3.  Was on 4mg  dose per past med list prior to that date.

## 2014-03-09 NOTE — Telephone Encounter (Signed)
Glimepiride refill denied due to discrepancy in dose.  Pt was to call me back and has not.  He didn't know what dose he was on.  Refill refused until can clarify.

## 2014-03-21 ENCOUNTER — Other Ambulatory Visit: Payer: Self-pay | Admitting: Physician Assistant

## 2014-03-22 NOTE — Telephone Encounter (Signed)
Pt needs to call office for refill of glimepiride.  Conflict in dosage

## 2014-03-27 MED ORDER — GLIMEPIRIDE 4 MG PO TABS: 4.0000 mg | ORAL_TABLET | Freq: Every day | ORAL | Status: DC

## 2014-03-27 NOTE — Telephone Encounter (Signed)
Wife has now finally called back.  Confirmed pt on 4 mg glimepiride once daily.  Refill sent.  Confirmed with wife follow up appt for early April.  Med list updated

## 2014-04-23 ENCOUNTER — Ambulatory Visit: Payer: BLUE CROSS/BLUE SHIELD | Admitting: Physician Assistant

## 2014-04-27 ENCOUNTER — Other Ambulatory Visit: Payer: Self-pay | Admitting: Physician Assistant

## 2014-04-27 NOTE — Telephone Encounter (Signed)
Medication refilled per protocol. 

## 2014-04-30 ENCOUNTER — Encounter: Payer: Self-pay | Admitting: Family Medicine

## 2014-04-30 ENCOUNTER — Ambulatory Visit (INDEPENDENT_AMBULATORY_CARE_PROVIDER_SITE_OTHER): Payer: BLUE CROSS/BLUE SHIELD | Admitting: Family Medicine

## 2014-04-30 VITALS — BP 128/74 | HR 76 | Temp 98.1°F | Resp 14 | Ht 70.5 in | Wt 248.0 lb

## 2014-04-30 DIAGNOSIS — E119 Type 2 diabetes mellitus without complications: Secondary | ICD-10-CM

## 2014-04-30 DIAGNOSIS — R350 Frequency of micturition: Secondary | ICD-10-CM | POA: Diagnosis not present

## 2014-04-30 DIAGNOSIS — E669 Obesity, unspecified: Secondary | ICD-10-CM | POA: Diagnosis not present

## 2014-04-30 LAB — COMPREHENSIVE METABOLIC PANEL
ALK PHOS: 83 U/L (ref 39–117)
ALT: 23 U/L (ref 0–53)
AST: 18 U/L (ref 0–37)
Albumin: 4.2 g/dL (ref 3.5–5.2)
BILIRUBIN TOTAL: 0.5 mg/dL (ref 0.2–1.2)
BUN: 13 mg/dL (ref 6–23)
CO2: 26 mEq/L (ref 19–32)
CREATININE: 0.66 mg/dL (ref 0.50–1.35)
Calcium: 9.5 mg/dL (ref 8.4–10.5)
Chloride: 104 mEq/L (ref 96–112)
Glucose, Bld: 202 mg/dL — ABNORMAL HIGH (ref 70–99)
Potassium: 4.4 mEq/L (ref 3.5–5.3)
SODIUM: 139 meq/L (ref 135–145)
TOTAL PROTEIN: 7.1 g/dL (ref 6.0–8.3)

## 2014-04-30 LAB — URINALYSIS, MICROSCOPIC ONLY
Bacteria, UA: NONE SEEN
Casts: NONE SEEN
Crystals: NONE SEEN

## 2014-04-30 LAB — URINALYSIS, ROUTINE W REFLEX MICROSCOPIC
Bilirubin Urine: NEGATIVE
GLUCOSE, UA: 500 mg/dL — AB
Leukocytes, UA: NEGATIVE
Nitrite: NEGATIVE
PH: 5.5 (ref 5.0–8.0)
Protein, ur: NEGATIVE mg/dL
Specific Gravity, Urine: 1.02 (ref 1.005–1.030)
Urobilinogen, UA: 1 mg/dL (ref 0.0–1.0)

## 2014-04-30 LAB — HEMOGLOBIN A1C
Hgb A1c MFr Bld: 10.6 % — ABNORMAL HIGH (ref <5.7)
MEAN PLASMA GLUCOSE: 258 mg/dL — AB (ref <117)

## 2014-04-30 NOTE — Patient Instructions (Signed)
We will call with lab and culture results Continue your current medicaitons Work on healthier eating,no fried foods  F/U 3 months

## 2014-04-30 NOTE — Assessment & Plan Note (Addendum)
Uncontrolled diabetes mellitus. We discussed dietary changes. He is also to increase his water intake. I'll titrate his medications so that his A1c is less than 7%. He is on statin drugs his cholesterol is well controlled this was checked 12 weeks ago. He is also on ACE inhibitor.

## 2014-04-30 NOTE — Progress Notes (Signed)
Patient ID: Chad Pod., male   DOB: 12-18-1965, 49 y.o.   MRN: [id number]   Subjective:    Patient ID: Chad Pod., male    DOB: February 26, 1965, 49 y.o.   MRN: [id number]  Patient presents for Urinary Frequency and F/U DM  Patient follow-up diabetes mellitus. His diabetes has been uncontrolled his last A1c 8.3%. He states that he started drinking diet soda but he eats out every day and he has gained about 20 pounds over the past 4-5 months. For the past few days he's had some urinary frequency and some mild discomfort with urination. He denies any blood in the urine. He feels like he is not emptying his bladder all the way. He has not had any problems with constipation he has not had any abnormal drainage from the urethra. He does not drink very much water. He did not bring his meter with him today but states that he is taking his medication as prescribed which is Amaryl as well as metformin 1000 mg twice a day   Review Of Systems:  GEN- denies fatigue, fever, weight loss,weakness, recent illness HEENT- denies eye drainage, change in vision, nasal discharge, CVS- denies chest pain, palpitations RESP- denies SOB, cough, wheeze ABD- denies N/V, change in stools, abd pain GU- + dysuria, hematuria, dribbling, incontinence MSK- denies joint pain, muscle aches, injury Neuro- denies headache, dizziness, syncope, seizure activity       Objective:    BP 128/74 mmHg  Pulse 76  Temp(Src) 98.1 F (36.7 C) (Oral)  Resp 14  Ht 5' 10.5" (1.791 m)  Wt 248 lb (112.492 kg)  BMI 35.07 kg/m2 GEN- NAD, alert and oriented x3 HEENT- PERRL, EOMI, non injected sclera, pink conjunctiva, MMM, oropharynx clear CVS- RRR, no murmur RESP-CTAB ABD-NABS,soft,NT,ND, no cva tenderness EXT- No edema Pulses- Radial,  2+        Assessment & Plan:      Problem List Items Addressed This Visit    Diabetes mellitus without complication   Relevant Orders   Comprehensive metabolic panel   Hemoglobin  A1c   Microalbumin / creatinine urine ratio    Other Visit Diagnoses    Urinary frequency    -  Primary    UA no sign of infection but defintely elevated CBG, will send for culture, possible BPH but no gradual onset to symptoms,will treat diabetes first    Relevant Orders    Urinalysis, Routine w reflex microscopic (Completed)    Urine culture       Note: This dictation was prepared with Dragon dictation along with smaller phrase technology. Any transcriptional errors that result from this process are unintentional.

## 2014-05-01 LAB — URINE CULTURE
Colony Count: NO GROWTH
ORGANISM ID, BACTERIA: NO GROWTH

## 2014-05-01 LAB — MICROALBUMIN / CREATININE URINE RATIO
Creatinine, Urine: 130.5 mg/dL
MICROALB UR: 1.7 mg/dL (ref <2.0)
Microalb Creat Ratio: 13 mg/g (ref 0.0–30.0)

## 2014-05-03 ENCOUNTER — Ambulatory Visit: Payer: BLUE CROSS/BLUE SHIELD | Admitting: Physician Assistant

## 2014-05-07 ENCOUNTER — Telehealth: Payer: Self-pay | Admitting: Family Medicine

## 2014-05-07 NOTE — Telephone Encounter (Signed)
Wants to know what results of urine test.  Says it still feels "funny" when he urinates.  Also the Invokana has given him diarrhea and abd discomfort.  Please advise.

## 2014-05-07 NOTE — Telephone Encounter (Signed)
I called wife back and told her provider recommendations

## 2014-05-07 NOTE — Telephone Encounter (Signed)
Urine culture was negative from 1 week ago, it is in the chart He can d/c the invokana if diarrhea does not improve after another week, he needs to call and let us know, if not we will need to try an injectable medication

## 2014-05-09 ENCOUNTER — Telehealth: Payer: Self-pay | Admitting: *Deleted

## 2014-05-09 DIAGNOSIS — R350 Frequency of micturition: Secondary | ICD-10-CM

## 2014-05-09 NOTE — Telephone Encounter (Signed)
Pt wife called stating that pt is still having "wierd sensations" when he urinates says it just feels funny. When he was here last he left a specimen and came back normal, pt wants to know if you can refer him to urologist. Please advise!

## 2014-05-10 NOTE — Telephone Encounter (Signed)
Yes. Urology referral approved.

## 2014-05-10 NOTE — Telephone Encounter (Signed)
Urology referral placed

## 2014-05-11 ENCOUNTER — Telehealth: Payer: Self-pay | Admitting: Physician Assistant

## 2014-05-11 NOTE — Telephone Encounter (Signed)
Wife wants antibiotic.  Told urine culture was negative no need for antibiotics.  Husband still with "strange sensation" when voids.  Wondering if he need prostate check, family history with his father.  Does not want to wait for Urology appt.  Appt made for him next week for prostate check.

## 2014-05-11 NOTE — Telephone Encounter (Signed)
Patients wife calling to say that she knows he has referral in place for urologist, but would like to know if we can call in medication for the uti, and maybe he would not even have to to urologist?    [phone]  [employer]

## 2014-05-14 ENCOUNTER — Ambulatory Visit (INDEPENDENT_AMBULATORY_CARE_PROVIDER_SITE_OTHER): Payer: BLUE CROSS/BLUE SHIELD | Admitting: Family Medicine

## 2014-05-14 ENCOUNTER — Encounter: Payer: Self-pay | Admitting: Family Medicine

## 2014-05-14 VITALS — BP 118/78 | HR 84 | Temp 98.1°F | Resp 16 | Ht 70.0 in | Wt 242.0 lb

## 2014-05-14 DIAGNOSIS — N410 Acute prostatitis: Secondary | ICD-10-CM

## 2014-05-14 MED ORDER — CIPROFLOXACIN HCL 500 MG PO TABS: 500.0000 mg | ORAL_TABLET | Freq: Two times a day (BID) | ORAL | Status: DC

## 2014-05-14 NOTE — Progress Notes (Signed)
Subjective:    Patient ID: Chad Pod., male    DOB: 12/05/1965, 49 y.o.   MRN: [id number]  HPI Patient was seen on April 18 complaining of polyuria. At that time urinalysis revealed no evidence of infection. A urine culture was normal. However the patient's hemoglobin A1c was elevated at 10.5. Patient states that he has been doing a better job recently and his blood sugars are typically between 130 and 150. However he continues to have polyuria. He states that sometimes he has to go to the bathroom up to 20 times in a day. He is also having discomfort with urination. He is reporting some hesitancy as well. He is reporting frequency. On examination today the patient does have a slightly swollen prostate which is mildly tender to palpation. There is no prostate nodularity Past Medical History  Diagnosis Date  . Rhinitis, allergic   . GERD (gastroesophageal reflux disease)   . White coat hypertension   . Neck pain   . Leg pain   . Diabetes mellitus without complication   . Vitamin D deficiency   . Hyperlipidemia   . Hypogonadism male   . H. pylori infection   . Depression   . Low magnesium levels   . Vitamin B12 deficiency    Past Surgical History  Procedure Laterality Date  . Knee arthroscopy Right    Current Outpatient Prescriptions on File Prior to Visit  Medication Sig Dispense Refill  . glimepiride (AMARYL) 4 MG tablet Take 1 tablet (4 mg total) by mouth daily before breakfast. 30 tablet 1  . glucose blood (BAYER CONTOUR NEXT TEST) test strip by Other route Two (2) times a day.    . lisinopril (PRINIVIL,ZESTRIL) 5 MG tablet TAKE 1 TABLET (5 MG TOTAL) BY MOUTH DAILY. 30 tablet 0  . metFORMIN (GLUCOPHAGE) 1000 MG tablet TAKE 1 TABLET (1,000 MG TOTAL) BY MOUTH 2 (TWO) TIMES DAILY WITH A MEAL. 60 tablet 0  . omeprazole (PRILOSEC) 40 MG capsule Take 40 mg by mouth daily.    Marland Kitchen PARoxetine (PAXIL) 20 MG tablet TAKE 1 TABLET BY MOUTH EVERY DAY 30 tablet 0  . simvastatin (ZOCOR) 80  MG tablet TAKE 1 TABLET (80 MG TOTAL) BY MOUTH AT BEDTIME. 30 tablet 0   No current facility-administered medications on file prior to visit.   No Known Allergies History   Social History  . Marital Status: Married    Spouse Name: N/A  . Number of Children: N/A  . Years of Education: N/A   Occupational History  . Not on file.   Social History Main Topics  . Smoking status: Never Smoker   . Smokeless tobacco: Never Used  . Alcohol Use: 0.0 oz/week    0 Standard drinks or equivalent per week     Comment: occasionally  . Drug Use: No  . Sexual Activity: Yes   Other Topics Concern  . Not on file   Social History Narrative   Married. No children. Does not formal exercise.   In school at [employer] full-time. Works part-time job at night.   "Has to walk a mile from the car to classic [place]"      Review of Systems  All other systems reviewed and are negative.      Objective:   Physical Exam  Cardiovascular: Normal rate, regular rhythm and normal heart sounds.   Pulmonary/Chest: Effort normal and breath sounds normal.  Genitourinary: Prostate is enlarged and tender.  Vitals reviewed.  Assessment & Plan:  Prostatitis, acute - Plan: ciprofloxacin (CIPRO) 500 MG tablet, PSA  Symptoms may be consistent with prostatitis. Begin Cipro 500 mg by mouth twice a day for 14 days. Patient has just had a negative urine culture. Also explained to the patient that hyperglycemia can cause polyuria but he insists that his blood sugars are 100-150 and this would not cause polyuria. Recheck in 2 weeks if no better or sooner if worse I will also check a PSA.

## 2014-05-15 ENCOUNTER — Ambulatory Visit: Payer: Self-pay | Admitting: Family Medicine

## 2014-05-15 LAB — PSA: PSA: 0.97 ng/mL (ref <=4.00)

## 2014-05-16 ENCOUNTER — Encounter: Payer: Self-pay | Admitting: Family Medicine

## 2014-05-17 ENCOUNTER — Telehealth: Payer: Self-pay | Admitting: Physician Assistant

## 2014-05-17 NOTE — Telephone Encounter (Signed)
Patients wife aware of results.

## 2014-05-17 NOTE — Telephone Encounter (Signed)
Patient's wife calling to see if the results from Chad Haynes's prostate test exam were in  [phone]

## 2014-05-29 ENCOUNTER — Other Ambulatory Visit: Payer: Self-pay | Admitting: Physician Assistant

## 2014-05-29 NOTE — Telephone Encounter (Signed)
1 month refill until appt.06/08/14

## 2014-06-03 ENCOUNTER — Other Ambulatory Visit: Payer: Self-pay | Admitting: Physician Assistant

## 2014-06-04 NOTE — Telephone Encounter (Signed)
Medication refilled per protocol. 

## 2014-06-08 ENCOUNTER — Encounter: Payer: Self-pay | Admitting: Family Medicine

## 2014-06-08 ENCOUNTER — Ambulatory Visit (INDEPENDENT_AMBULATORY_CARE_PROVIDER_SITE_OTHER): Payer: BLUE CROSS/BLUE SHIELD | Admitting: Family Medicine

## 2014-06-08 VITALS — BP 128/78 | HR 68 | Temp 97.6°F | Resp 16 | Ht 70.0 in | Wt 242.0 lb

## 2014-06-08 DIAGNOSIS — E669 Obesity, unspecified: Secondary | ICD-10-CM | POA: Diagnosis not present

## 2014-06-08 DIAGNOSIS — E119 Type 2 diabetes mellitus without complications: Secondary | ICD-10-CM | POA: Diagnosis not present

## 2014-06-08 LAB — CBC WITH DIFFERENTIAL/PLATELET
BASOS PCT: 1 % (ref 0–1)
Basophils Absolute: 0.1 10*3/uL (ref 0.0–0.1)
EOS PCT: 4 % (ref 0–5)
Eosinophils Absolute: 0.2 10*3/uL (ref 0.0–0.7)
HCT: 44.2 % (ref 39.0–52.0)
Hemoglobin: 14.6 g/dL (ref 13.0–17.0)
Lymphocytes Relative: 34 % (ref 12–46)
Lymphs Abs: 2 10*3/uL (ref 0.7–4.0)
MCH: 28.3 pg (ref 26.0–34.0)
MCHC: 33 g/dL (ref 30.0–36.0)
MCV: 85.8 fL (ref 78.0–100.0)
MPV: 9 fL (ref 8.6–12.4)
Monocytes Absolute: 0.5 10*3/uL (ref 0.1–1.0)
Monocytes Relative: 8 % (ref 3–12)
Neutro Abs: 3.1 10*3/uL (ref 1.7–7.7)
Neutrophils Relative %: 53 % (ref 43–77)
Platelets: 320 10*3/uL (ref 150–400)
RBC: 5.15 MIL/uL (ref 4.22–5.81)
RDW: 14 % (ref 11.5–15.5)
WBC: 5.9 10*3/uL (ref 4.0–10.5)

## 2014-06-08 MED ORDER — CANAGLIFLOZIN 100 MG PO TABS: 100.0000 mg | ORAL_TABLET | Freq: Every day | ORAL | Status: DC

## 2014-06-08 NOTE — Assessment & Plan Note (Signed)
Blood sugars have improved, will continue him on Invokana , along with amaryl and Metformin Return 8 weeks for A1C and lipids Continue to work on weight loss and healthy eating CBC done today as overdue

## 2014-06-08 NOTE — Patient Instructions (Signed)
We will call with CBC results Restart the invokana F/U 2 months - fasting labs

## 2014-06-08 NOTE — Progress Notes (Signed)
Patient ID: Chad Pod., male   DOB: [birth year], 49 y.o.   MRN: [id number]   Subjective:    Patient ID: Chad Pod., male    DOB: May 11, 1965, 49 y.o.   MRN: [id number]  Patient presents for DM F/U  Pt here to f/u DM - interim visit, no concerns, A1C 10.6%. He is on MTF, amaryl , I added Invokana 100mg  after labs returned, he was not checking CBG previously, but now fastaing are between  100-150, he had 1 episode of hypoglycemia CBG was 80.  After a few meals CBG was 200-300 .  He did run out of Invokana samples 2 days ago, no side effects with medications. He also requested CBC, history of Leukocytosis after some complications from some medical trial at [facility]:  GEN- denies fatigue, fever, weight loss,weakness, recent illness HEENT- denies eye drainage, change in vision, nasal discharge, CVS- denies chest pain, palpitations RESP- denies SOB, cough, wheeze ABD- denies N/V, change in stools, abd pain GU- denies dysuria, hematuria, dribbling, incontinence MSK- denies joint pain, muscle aches, injury Neuro- denies headache, dizziness, syncope, seizure activity       Objective:    BP 128/78 mmHg  Pulse 68  Temp(Src) 97.6 F (36.4 C) (Oral)  Resp 16  Ht 5\' 10"  (1.778 m)  Wt 242 lb (109.77 kg)  BMI 34.72 kg/m2 GEN- NAD, alert and oriented x3 CVS- RRR, no murmur RESP-CTAB EXT- No edema Pulses- Radial, DP- 2+        Assessment & Plan:      Problem List Items Addressed This Visit    None      Note: This dictation was prepared with Dragon dictation along with smaller phrase technology. Any transcriptional errors that result from this process are unintentional.

## 2014-06-28 ENCOUNTER — Telehealth: Payer: Self-pay | Admitting: *Deleted

## 2014-06-28 NOTE — Telephone Encounter (Signed)
Received patient Wellness Exam Form.   MD completed form and states that patient required to come in to office to have waist circumference measured. Patient also needs to sign form before it can be faxed.   Call placed to patient and patient wife made aware. Patient arrived in office and form completed.   Faxed to insurance.

## 2014-06-29 ENCOUNTER — Other Ambulatory Visit: Payer: Self-pay | Admitting: Physician Assistant

## 2014-06-29 NOTE — Telephone Encounter (Signed)
Prescription sent to pharmacy.

## 2014-06-29 NOTE — Telephone Encounter (Signed)
?  ok to refill paxil?

## 2014-06-29 NOTE — Telephone Encounter (Signed)
Approved to refill to last 6 months.

## 2014-07-11 ENCOUNTER — Telehealth: Payer: Self-pay | Admitting: *Deleted

## 2014-07-11 NOTE — Telephone Encounter (Signed)
Received fax from [facility] urology stating that pt was scheduled for 07/04/14 with Dr. Junious Silk and was a NO SHOW.

## 2014-08-13 ENCOUNTER — Encounter: Payer: BLUE CROSS/BLUE SHIELD | Admitting: Family Medicine

## 2014-08-13 NOTE — Progress Notes (Signed)
error 

## 2014-08-15 ENCOUNTER — Other Ambulatory Visit: Payer: Self-pay | Admitting: Family Medicine

## 2014-08-15 MED ORDER — GLIMEPIRIDE 4 MG PO TABS: 4.0000 mg | ORAL_TABLET | Freq: Every day | ORAL | Status: DC

## 2014-09-03 ENCOUNTER — Other Ambulatory Visit: Payer: Self-pay | Admitting: Physician Assistant

## 2014-09-03 NOTE — Telephone Encounter (Signed)
Refill appropriate and filled per protocol. 

## 2014-09-27 ENCOUNTER — Encounter: Payer: Self-pay | Admitting: Physician Assistant

## 2014-10-30 ENCOUNTER — Encounter: Payer: Self-pay | Admitting: Physician Assistant

## 2014-11-26 ENCOUNTER — Other Ambulatory Visit: Payer: Self-pay | Admitting: Physician Assistant

## 2014-11-27 ENCOUNTER — Encounter: Payer: Self-pay | Admitting: Family Medicine

## 2014-11-27 NOTE — Telephone Encounter (Signed)
Medication refill for one time only.  Patient needs to be seen.  Letter sent for patient to call and schedule 

## 2014-12-28 ENCOUNTER — Encounter: Payer: Self-pay | Admitting: Family Medicine

## 2014-12-28 ENCOUNTER — Other Ambulatory Visit: Payer: Self-pay | Admitting: Physician Assistant

## 2014-12-28 NOTE — Telephone Encounter (Signed)
Medication refill for one time only.  Patient needs to be seen.  Letter sent for patient to call and schedule 

## 2015-02-15 ENCOUNTER — Other Ambulatory Visit: Payer: Self-pay | Admitting: Family Medicine

## 2015-02-15 NOTE — Telephone Encounter (Signed)
Medication filled x1 with no refills.   Requires office visit before any further refills can be given.   Letter sent.  

## 2015-02-25 ENCOUNTER — Other Ambulatory Visit: Payer: Self-pay | Admitting: Physician Assistant

## 2015-02-25 ENCOUNTER — Encounter: Payer: Self-pay | Admitting: Family Medicine

## 2015-02-25 NOTE — Telephone Encounter (Signed)
Refill denied until pt makes appt.  Has been sent two letters, another one sent today.

## 2015-03-07 ENCOUNTER — Ambulatory Visit: Payer: BLUE CROSS/BLUE SHIELD | Admitting: Physician Assistant

## 2015-03-13 ENCOUNTER — Other Ambulatory Visit: Payer: Self-pay | Admitting: Family Medicine

## 2015-03-13 NOTE — Telephone Encounter (Signed)
Refill appropriate and filled per protocol. 

## 2015-03-14 ENCOUNTER — Ambulatory Visit: Payer: BLUE CROSS/BLUE SHIELD | Admitting: Physician Assistant

## 2015-03-18 ENCOUNTER — Ambulatory Visit: Payer: BLUE CROSS/BLUE SHIELD | Admitting: Physician Assistant

## 2015-03-21 ENCOUNTER — Encounter: Payer: Self-pay | Admitting: Physician Assistant

## 2015-03-21 ENCOUNTER — Ambulatory Visit (INDEPENDENT_AMBULATORY_CARE_PROVIDER_SITE_OTHER): Payer: BLUE CROSS/BLUE SHIELD | Admitting: Physician Assistant

## 2015-03-21 VITALS — BP 130/90 | HR 80 | Temp 97.8°F | Resp 18 | Wt 239.0 lb

## 2015-03-21 DIAGNOSIS — F329 Major depressive disorder, single episode, unspecified: Secondary | ICD-10-CM

## 2015-03-21 DIAGNOSIS — E559 Vitamin D deficiency, unspecified: Secondary | ICD-10-CM

## 2015-03-21 DIAGNOSIS — E785 Hyperlipidemia, unspecified: Secondary | ICD-10-CM

## 2015-03-21 DIAGNOSIS — E119 Type 2 diabetes mellitus without complications: Secondary | ICD-10-CM | POA: Diagnosis not present

## 2015-03-21 DIAGNOSIS — F32A Depression, unspecified: Secondary | ICD-10-CM

## 2015-03-21 DIAGNOSIS — K219 Gastro-esophageal reflux disease without esophagitis: Secondary | ICD-10-CM

## 2015-03-21 MED ORDER — PAROXETINE HCL 20 MG PO TABS: 20.0000 mg | ORAL_TABLET | Freq: Every day | ORAL | Status: DC

## 2015-03-21 MED ORDER — METFORMIN HCL 1000 MG PO TABS: ORAL_TABLET | ORAL | Status: DC

## 2015-03-21 MED ORDER — SIMVASTATIN 80 MG PO TABS: ORAL_TABLET | ORAL | Status: DC

## 2015-03-21 MED ORDER — LISINOPRIL 5 MG PO TABS: 5.0000 mg | ORAL_TABLET | Freq: Every day | ORAL | Status: DC

## 2015-03-21 MED ORDER — GLIMEPIRIDE 4 MG PO TABS: 4.0000 mg | ORAL_TABLET | Freq: Every day | ORAL | Status: DC

## 2015-03-22 LAB — COMPLETE METABOLIC PANEL WITH GFR
ALT: 22 U/L (ref 9–46)
AST: 19 U/L (ref 10–40)
Albumin: 4.3 g/dL (ref 3.6–5.1)
Alkaline Phosphatase: 106 U/L (ref 40–115)
BILIRUBIN TOTAL: 0.6 mg/dL (ref 0.2–1.2)
BUN: 10 mg/dL (ref 7–25)
CALCIUM: 9.6 mg/dL (ref 8.6–10.3)
CHLORIDE: 99 mmol/L (ref 98–110)
CO2: 22 mmol/L (ref 20–31)
CREATININE: 0.81 mg/dL (ref 0.60–1.35)
GFR, Est Non African American: >89 mL/min (ref >=60)
Glucose, Bld: 264 mg/dL — ABNORMAL HIGH (ref 70–99)
Potassium: 4.4 mmol/L (ref 3.5–5.3)
Sodium: 135 mmol/L (ref 135–146)
TOTAL PROTEIN: 7.1 g/dL (ref 6.1–8.1)

## 2015-03-22 LAB — HEMOGLOBIN A1C
HEMOGLOBIN A1C: 10.5 % — AB (ref <5.7)
MEAN PLASMA GLUCOSE: 255 mg/dL — AB (ref <117)

## 2015-03-22 NOTE — Progress Notes (Signed)
Patient ID: Chad Haynes. MRN: [id number], DOB: 19-Jan-1965, 50 y.o. Date of Encounter: @DATE @  Chief Complaint:  Chief Complaint  Patient presents with  . check up/med refills    HPI: 50 y.o. year old male  presents for f/u OV for his Diabetes and other chronic medical problems.  Reviewed chart that it has been long time since LOV.  He reports he has seen no other medicla providers since LOV here. Says that with his current insurance plan, he has to pay for OV out of pocket and this is one reason he hasnt been in sooner.  Says we would not refill meds without OV so finally came in.  Out of glimerimid about 1 week.   Has been off Invokana a long time--b/c wife read on internet about really bad side effects. --- ?????   Taking other DM meds as directed. No adv effects.   Taking ACE Inh--no adv effects.   Taking statin--no myalgias or other adverse effects   Taking Paxil.  Depression controlled with this. No adv effects with this med.   No complaints/cncerns today.    Past Medical History  Diagnosis Date  . Rhinitis, allergic   . GERD (gastroesophageal reflux disease)   . White coat hypertension   . Neck pain   . Leg pain   . Diabetes mellitus without complication ([place])   . Vitamin D deficiency   . Hyperlipidemia   . Hypogonadism male   . H. pylori infection   . Depression   . Low magnesium levels   . Vitamin B12 deficiency      Home Meds: Outpatient Prescriptions Prior to Visit  Medication Sig Dispense Refill  . glucose blood (BAYER CONTOUR NEXT TEST) test strip by Other route Two (2) times a day.    Marland Kitchen omeprazole (PRILOSEC) 40 MG capsule Take 40 mg by mouth daily.    Marland Kitchen glimepiride (AMARYL) 4 MG tablet TAKE 1 TABLET BY MOUTH DAILY WITH BREAKFAST 30 tablet 0  . lisinopril (PRINIVIL,ZESTRIL) 5 MG tablet TAKE 1 TABLET BY MOUTH EVERY DAY 30 tablet 0  . metFORMIN (GLUCOPHAGE) 1000 MG tablet TAKE 1 TABLET BY MOUTH TWICE A DAY WITH A MEAL 60 tablet 0  . PARoxetine  (PAXIL) 20 MG tablet TAKE 1 TABLET BY MOUTH EVERY DAY 30 tablet 0  . simvastatin (ZOCOR) 80 MG tablet TAKE 1 TABLET (80 MG TOTAL) BY MOUTH AT BEDTIME. 30 tablet 3  . canagliflozin (INVOKANA) 100 MG TABS tablet Take 1 tablet (100 mg total) by mouth daily. (Patient not taking: Reported on 03/21/2015) 30 tablet 6   No facility-administered medications prior to visit.    Allergies: No Known Allergies  Social History   Social History  . Marital Status: Married    Spouse Name: N/A  . Number of Children: N/A  . Years of Education: N/A   Occupational History  . Not on file.   Social History Main Topics  . Smoking status: Former Smoker    Quit date: 06/08/1991  . Smokeless tobacco: Never Used  . Alcohol Use: 0.0 oz/week    0 Standard drinks or equivalent per week     Comment: occasionally  . Drug Use: No  . Sexual Activity: Yes   Other Topics Concern  . Not on file   Social History Narrative   Married. No children. Does not formal exercise.   In school at [employer] full-time. Works part-time job at night.   "Has to walk a mile from the car  to classic [place]"    Family History  Problem Relation Age of Onset  . COPD Mother     smoker  . Hypertension Father      Review of Systems:  See HPI for pertinent ROS. All other ROS negative.    Physical Exam: Blood pressure 130/90, pulse 80, temperature 97.8 F (36.6 C), temperature source Oral, resp. rate 18, weight 239 lb (108.41 kg)., Body mass index is 34.29 kg/(m^2). General: WNWD WM. Appears in no acute distress. Neck: Supple. No thyromegaly. No lymphadenopathy.No carotid bruits. Lungs: Clear bilaterally to auscultation without wheezes, rales, or rhonchi. Breathing is unlabored. Heart: RRR with S1 S2. No murmurs, rubs, or gallops. Abdomen: Soft, non-tender, non-distended with normoactive bowel sounds. No hepatomegaly. No rebound/guarding. No obvious abdominal masses. Musculoskeletal:  Strength and tone normal for  age. Extremities/Skin: Warm and dry.  No LE Edema.  Recommended dibetic foot exam. He defers. Says no wounds/problem areas.  Neuro: Alert and oriented X 3. Moves all extremities spontaneously. Gait is normal. CNII-XII grossly in tact. Psych:  Responds to questions appropriately with a normal affect.     ASSESSMENT AND PLAN:  50 y.o. year old male with  1. Diabetes mellitus without complication ([place]) Check labs and f/u results then adjust meds accordingly.  On ACE Inh. On Statin.  At future OV, will discuss ASA 81mg  and Eye Exam  - COMPLETE METABOLIC PANEL WITH GFR - Hemoglobin A1c  2. Hyperlipidemia On statin. Not fasting. Will check LFTs now.  - COMPLETE METABOLIC PANEL WITH GFR  3. Gastroesophageal reflux disease, esophagitis presence not specified Controlled with current tx  4. Depression Stable/Controlled -- Continue current med  5. Vitamin D deficiency He is on Vit D supplement.   F/U OV 3 months, sooner if needed.    Signed, [address] [place], [place], [facility] 03/22/2015 11:44 AM

## 2015-03-29 ENCOUNTER — Encounter: Payer: Self-pay | Admitting: Physician Assistant

## 2015-04-04 ENCOUNTER — Encounter: Payer: Self-pay | Admitting: Family Medicine

## 2015-04-04 DIAGNOSIS — Z91199 Patient's noncompliance with other medical treatment and regimen due to unspecified reason: Secondary | ICD-10-CM | POA: Insufficient documentation

## 2015-04-04 DIAGNOSIS — Z9119 Patient's noncompliance with other medical treatment and regimen: Secondary | ICD-10-CM | POA: Insufficient documentation

## 2015-06-24 ENCOUNTER — Ambulatory Visit: Payer: BLUE CROSS/BLUE SHIELD | Admitting: Physician Assistant

## 2015-06-26 ENCOUNTER — Ambulatory Visit: Payer: BLUE CROSS/BLUE SHIELD | Admitting: Physician Assistant

## 2015-09-18 ENCOUNTER — Other Ambulatory Visit: Payer: Self-pay | Admitting: Physician Assistant

## 2015-10-10 ENCOUNTER — Other Ambulatory Visit: Payer: Self-pay | Admitting: Physician Assistant

## 2015-11-06 ENCOUNTER — Other Ambulatory Visit: Payer: Self-pay

## 2015-11-06 MED ORDER — PAROXETINE HCL 20 MG PO TABS: 20.0000 mg | ORAL_TABLET | Freq: Every day | ORAL | 0 refills | Status: DC

## 2015-11-06 NOTE — Telephone Encounter (Signed)
Rx refilled per protocol. Pt req 90 supply  F/u appt is needed. Letter sent out

## 2016-01-14 ENCOUNTER — Encounter: Payer: Self-pay | Admitting: Physician Assistant

## 2016-03-24 ENCOUNTER — Encounter: Payer: Self-pay | Admitting: Physician Assistant

## 2016-04-10 ENCOUNTER — Other Ambulatory Visit: Payer: Self-pay | Admitting: Physician Assistant

## 2016-05-04 ENCOUNTER — Other Ambulatory Visit: Payer: Self-pay | Admitting: Physician Assistant

## 2016-05-07 ENCOUNTER — Ambulatory Visit (INDEPENDENT_AMBULATORY_CARE_PROVIDER_SITE_OTHER): Payer: BLUE CROSS/BLUE SHIELD | Admitting: Physician Assistant

## 2016-05-07 ENCOUNTER — Encounter: Payer: BLUE CROSS/BLUE SHIELD | Admitting: Physician Assistant

## 2016-05-07 ENCOUNTER — Encounter: Payer: Self-pay | Admitting: Physician Assistant

## 2016-05-07 VITALS — BP 130/90 | HR 90 | Temp 97.7°F | Resp 14 | Ht 70.5 in | Wt 249.6 lb

## 2016-05-07 DIAGNOSIS — E119 Type 2 diabetes mellitus without complications: Secondary | ICD-10-CM

## 2016-05-07 DIAGNOSIS — E785 Hyperlipidemia, unspecified: Secondary | ICD-10-CM

## 2016-05-07 DIAGNOSIS — E559 Vitamin D deficiency, unspecified: Secondary | ICD-10-CM

## 2016-05-07 DIAGNOSIS — K219 Gastro-esophageal reflux disease without esophagitis: Secondary | ICD-10-CM

## 2016-05-07 DIAGNOSIS — Z23 Encounter for immunization: Secondary | ICD-10-CM | POA: Diagnosis not present

## 2016-05-07 DIAGNOSIS — N529 Male erectile dysfunction, unspecified: Secondary | ICD-10-CM | POA: Insufficient documentation

## 2016-05-07 DIAGNOSIS — Z0000 Encounter for general adult medical examination without abnormal findings: Secondary | ICD-10-CM

## 2016-05-07 LAB — CBC WITH DIFFERENTIAL/PLATELET
BASOS ABS: 55 {cells}/uL (ref 0–200)
Basophils Relative: 1 %
EOS PCT: 4 %
Eosinophils Absolute: 220 cells/uL (ref 15–500)
HCT: 43.4 % (ref 38.5–50.0)
HEMOGLOBIN: 14.3 g/dL (ref 13.0–17.0)
LYMPHS ABS: 1485 {cells}/uL (ref 850–3900)
Lymphocytes Relative: 27 %
MCH: 27.7 pg (ref 27.0–33.0)
MCHC: 32.9 g/dL (ref 32.0–36.0)
MCV: 84.1 fL (ref 80.0–100.0)
MONOS PCT: 7 %
MPV: 9.5 fL (ref 7.5–12.5)
Monocytes Absolute: 385 cells/uL (ref 200–950)
NEUTROS PCT: 61 %
Neutro Abs: 3355 cells/uL (ref 1500–7800)
PLATELETS: 305 10*3/uL (ref 140–400)
RBC: 5.16 MIL/uL (ref 4.20–5.80)
RDW: 14.4 % (ref 11.0–15.0)
WBC: 5.5 10*3/uL (ref 3.8–10.8)

## 2016-05-07 LAB — LIPID PANEL
Cholesterol: 131 mg/dL (ref <200)
HDL: 28 mg/dL — ABNORMAL LOW (ref >40)
LDL CALC: 65 mg/dL (ref <100)
TRIGLYCERIDES: 188 mg/dL — AB (ref <150)
Total CHOL/HDL Ratio: 4.7 Ratio (ref <5.0)
VLDL: 38 mg/dL — ABNORMAL HIGH (ref <30)

## 2016-05-07 LAB — COMPLETE METABOLIC PANEL WITH GFR
AG RATIO: 1.6 ratio (ref 1.0–2.5)
ALK PHOS: 91 U/L (ref 40–115)
ALT: 31 U/L (ref 9–46)
AST: 25 U/L (ref 10–40)
Albumin: 4.3 g/dL (ref 3.6–5.1)
BUN / CREAT RATIO: 19.2 ratio (ref 6–22)
BUN: 15 mg/dL (ref 7–25)
CHLORIDE: 104 mmol/L (ref 98–110)
CO2: 19 mmol/L — ABNORMAL LOW (ref 20–31)
Calcium: 9.4 mg/dL (ref 8.6–10.3)
Creat: 0.78 mg/dL (ref 0.60–1.35)
GFR, Est African American: >89 mL/min (ref >=60)
GFR, Est Non African American: >89 mL/min (ref >=60)
Globulin: 2.7 g/dL (ref 1.9–3.7)
Glucose, Bld: 225 mg/dL — ABNORMAL HIGH (ref 70–99)
POTASSIUM: 4.7 mmol/L (ref 3.5–5.3)
SODIUM: 139 mmol/L (ref 135–146)
Total Bilirubin: 0.5 mg/dL (ref 0.2–1.2)
Total Protein: 7 g/dL (ref 6.1–8.1)

## 2016-05-07 LAB — TSH: TSH: 0.69 m[IU]/L (ref 0.40–4.50)

## 2016-05-07 MED ORDER — TADALAFIL 5 MG PO TABS: 5.0000 mg | ORAL_TABLET | Freq: Every day | ORAL | 11 refills | Status: DC

## 2016-05-07 MED ORDER — PAROXETINE HCL 20 MG PO TABS: 20.0000 mg | ORAL_TABLET | Freq: Every day | ORAL | 1 refills | Status: DC

## 2016-05-07 MED ORDER — LISINOPRIL 5 MG PO TABS: 5.0000 mg | ORAL_TABLET | Freq: Every day | ORAL | 5 refills | Status: DC

## 2016-05-07 MED ORDER — METFORMIN HCL 1000 MG PO TABS: ORAL_TABLET | ORAL | 5 refills | Status: DC

## 2016-05-07 MED ORDER — SIMVASTATIN 80 MG PO TABS: ORAL_TABLET | ORAL | 5 refills | Status: DC

## 2016-05-07 MED ORDER — GLIMEPIRIDE 4 MG PO TABS: ORAL_TABLET | ORAL | 5 refills | Status: DC

## 2016-05-07 MED ORDER — ASPIRIN 81 MG PO TABS: 81.0000 mg | ORAL_TABLET | Freq: Every day | ORAL | 11 refills | Status: DC

## 2016-05-07 MED ORDER — OMEPRAZOLE 40 MG PO CPDR: 40.0000 mg | DELAYED_RELEASE_CAPSULE | Freq: Every day | ORAL | 2 refills | Status: DC

## 2016-05-07 NOTE — Progress Notes (Signed)
Patient ID: Chad Haynes. MRN: [id number], DOB: 03-30-1965 51 y.o. Date of Encounter: 05/07/2016, 8:56 AM    Chief Complaint: Physical (CPE)  HPI: 51 y.o. y/o male here for CPE.   States that he is still taking all medications as listed/as directed. He says that he is interested on getting on diabetic medication that is the injection once a week. Told him that we will check his A1c today and then adjust medications accordingly. He also states that he wants to try the daily Cialis. He has used Viagra in the past and that did not work very well for him and is interested in taking the daily Cialis.  No other specific concerns to address today.  Review of Systems: Consitutional: No fever, chills, fatigue, night sweats, lymphadenopathy, or weight changes. Eyes: No visual changes, eye redness, or discharge. ENT/Mouth: Ears: No otalgia, tinnitus, hearing loss, discharge. Nose: No congestion, rhinorrhea, sinus pain, or epistaxis. Throat: No sore throat, post nasal drip, or teeth pain. Cardiovascular: No CP, palpitations, diaphoresis, DOE, edema, orthopnea, PND. Respiratory: No cough, hemoptysis, SOB, or wheezing. Gastrointestinal: No anorexia, dysphagia, reflux, pain, nausea, vomiting, hematemesis, diarrhea, constipation, BRBPR, or melena. Genitourinary: No dysuria, frequency, urgency, hematuria, incontinence, nocturia, decreased urinary stream, discharge, impotence, or testicular pain/masses. Musculoskeletal: No decreased ROM, myalgias, stiffness, joint swelling, or weakness. Skin: No rash, erythema, lesion changes, pain, warmth, jaundice, or pruritis. Neurological: No headache, dizziness, syncope, seizures, tremors, memory loss, coordination problems, or paresthesias. Psychological: No anxiety, depression, hallucinations, SI/HI. Endocrine:  Has known diabetes. All other systems were reviewed and are otherwise negative.  Past Medical History:  Diagnosis Date  . Depression   .  Diabetes mellitus without complication ([place])   . GERD (gastroesophageal reflux disease)   . H. pylori infection   . Hyperlipidemia   . Hypogonadism male   . Leg pain   . Low magnesium levels   . Neck pain   . Rhinitis, allergic   . Vitamin B12 deficiency   . Vitamin D deficiency   . White coat hypertension      Past Surgical History:  Procedure Laterality Date  . KNEE ARTHROSCOPY Right     Home Meds:  Outpatient Medications Prior to Visit  Medication Sig Dispense Refill  . glimepiride (AMARYL) 4 MG tablet TAKE 1 TABLET BY MOUTH EVERY DAY WITH BREAKFAST 30 tablet 5  . glucose blood (BAYER CONTOUR NEXT TEST) test strip by Other route Two (2) times a day.    . lisinopril (PRINIVIL,ZESTRIL) 5 MG tablet TAKE 1 TABLET BY MOUTH EVERY DAY 30 tablet 5  . metFORMIN (GLUCOPHAGE) 1000 MG tablet TAKE 1 TABLET BY MOUTH TWICE A DAY WITH A MEAL 60 tablet 5  . omeprazole (PRILOSEC) 40 MG capsule Take 40 mg by mouth daily.    Marland Kitchen PARoxetine (PAXIL) 20 MG tablet Take 1 tablet (20 mg total) by mouth daily. Patient need to have an office visit before disp a 90 day supply. 30 tablet 0  . simvastatin (ZOCOR) 80 MG tablet TAKE 1 TABLET (80 MG TOTAL) BY MOUTH AT BEDTIME. 30 tablet 5   No facility-administered medications prior to visit.     Allergies: No Known Allergies  Social History   Social History  . Marital status: Married    Spouse name: N/A  . Number of children: N/A  . Years of education: N/A   Occupational History  . Not on file.   Social History Main Topics  . Smoking status: Former Smoker  Quit date: 06/08/1991  . Smokeless tobacco: Never Used  . Alcohol use 0.0 oz/week     Comment: occasionally  . Drug use: No  . Sexual activity: Yes   Other Topics Concern  . Not on file   Social History Narrative   Married. No children. Does not formal exercise.   In school at [employer] full-time. Works part-time job at night.   "Has to walk a mile from the car to classic [place]"     Family History  Problem Relation Age of Onset  . COPD Mother     smoker  . Hypertension Father     Physical Exam: Blood pressure 130/90, pulse 90, temperature 97.7 F (36.5 C), temperature source Oral, resp. rate 14, height 5' 10.5" (1.791 m), weight 249 lb 9.6 oz (113.2 kg), SpO2 98 %.  General: Well developed, well nourished WM. Appears in no acute distress. HEENT: Normocephalic, atraumatic. Conjunctiva pink, sclera non-icteric. Pupils 2 mm constricting to 1 mm, round, regular, and equally reactive to light and accomodation. EOMI. Internal auditory canal clear. TMs with good cone of light and without pathology. Nasal mucosa pink. Nares are without discharge. No sinus tenderness. Oral mucosa pink. Pharynx without exudate.   Neck: Supple. Trachea midline. No thyromegaly. Full ROM. No lymphadenopathy. No carotid bruit. Lungs: Clear to auscultation bilaterally without wheezes, rales, or rhonchi. Breathing is of normal effort and unlabored. Cardiovascular: RRR with S1 S2. No murmurs, rubs, or gallops. Distal pulses 2+ symmetrically. No carotid or abdominal bruits. Abdomen: Soft, non-tender, non-distended with normoactive bowel sounds. No hepatosplenomegaly or masses. No rebound/guarding. No CVA tenderness. No hernias. Musculoskeletal: Full range of motion and 5/5 strength throughout. Skin: Warm and moist without erythema, ecchymosis, wounds, or rash. Neuro: A+Ox3. CN II-XII grossly intact. Moves all extremities spontaneously. Full sensation throughout. Normal gait. Psych:  Responds to questions appropriately with a normal affect.   Assessment/Plan:  51 y.o. y/o white male here for CPE  1. Encounter for preventive health examination  A. Screening Labs: - CBC with Differential/Platelet - COMPLETE METABOLIC PANEL WITH GFR - Lipid panel - TSH  B. Screening For Prostate Cancer: --Will start at age 51  C. Screening For Colorectal Cancer:  --Will start/discuss at age 53  D.  Immunizations: Flu------------------N/A Tetanus--------- has had no tetanus vaccine in over 10 years. Agreeable to update this today. T dap given here 05/07/2016 Pneumococcal--- he has received Pneumovax 23 01/17/2014. No further pneumonia vaccine until age 28. Will discuss shingrix next year-- once he is 51 years old.  2. Diabetes mellitus without complication ([place]) Check labs.  Add ASA 81mg  QD He is interested in adding once weekly injection medication for diabetes. I told him that we will get today's A1c results and then adjust medications accordingly. - Hemoglobin A1c - Microalbumin, urine - COMPLETE METABOLIC PANEL WITH GFR - aspirin 81 MG tablet; Take 1 tablet (81 mg total) by mouth daily.  Dispense: 30 tablet; Refill: 11  3. Gastroesophageal reflux disease, esophagitis presence not specified  4. Hyperlipidemia, unspecified hyperlipidemia type He is on simvastatin. Check labs to monitor now. - COMPLETE METABOLIC PANEL WITH GFR - Lipid panel  5. Vitamin D deficiency - VITAMIN D 25 Hydroxy (Vit-D Deficiency, Fractures)  6. Erectile dysfunction, unspecified erectile dysfunction type - tadalafil (CIALIS) 5 MG tablet; Take 1 tablet (5 mg total) by mouth daily.  Dispense: 30 tablet; Refill: 11  He needs to have follow-up visit in 3 months. Follow-up sooner if needed.  Signed:   [place],  BSFM  05/07/2016 8:56 AM

## 2016-05-08 LAB — MICROALBUMIN, URINE: MICROALB UR: 1.9 mg/dL

## 2016-05-08 LAB — HEMOGLOBIN A1C
HEMOGLOBIN A1C: 9.6 % — AB (ref <5.7)
MEAN PLASMA GLUCOSE: 229 mg/dL

## 2016-05-08 LAB — VITAMIN D 25 HYDROXY (VIT D DEFICIENCY, FRACTURES): Vit D, 25-Hydroxy: 21 ng/mL — ABNORMAL LOW (ref 30–100)

## 2016-05-10 ENCOUNTER — Other Ambulatory Visit: Payer: Self-pay | Admitting: Physician Assistant

## 2016-05-11 ENCOUNTER — Telehealth: Payer: Self-pay

## 2016-05-11 ENCOUNTER — Other Ambulatory Visit: Payer: Self-pay

## 2016-05-11 MED ORDER — DULAGLUTIDE 1.5 MG/0.5ML ~~LOC~~ SOAJ: 1.5000 mg | SUBCUTANEOUS | 3 refills | Status: DC

## 2016-05-11 MED ORDER — SITAGLIPTIN PHOSPHATE 100 MG PO TABS: 100.0000 mg | ORAL_TABLET | Freq: Every day | ORAL | 3 refills | Status: DC

## 2016-05-12 NOTE — Telephone Encounter (Signed)
Started prior authorization on Cialis 05/11/2016 and it was denied on 05/12/2016. The only alternative was Viagra. Spoke with pharmacy out of pocket cost for patient would be $180.00 towards his co pay.   I spoke with patient and explained this and he is not willing to pay that much out of pocket and states he will not worry about it at this time.

## 2016-10-16 ENCOUNTER — Other Ambulatory Visit: Payer: Self-pay | Admitting: Physician Assistant

## 2016-10-22 ENCOUNTER — Other Ambulatory Visit: Payer: Self-pay | Admitting: *Deleted

## 2016-10-22 MED ORDER — PAROXETINE HCL 20 MG PO TABS
20.0000 mg | ORAL_TABLET | Freq: Every day | ORAL | 0 refills | Status: DC
Start: 2016-10-22 — End: 2016-12-18

## 2016-10-22 MED ORDER — SIMVASTATIN 80 MG PO TABS: ORAL_TABLET | ORAL | 0 refills | Status: DC

## 2016-10-22 MED ORDER — GLIMEPIRIDE 4 MG PO TABS: ORAL_TABLET | ORAL | 0 refills | Status: DC

## 2016-10-22 NOTE — Telephone Encounter (Signed)
Received fax requesting refill on Glimepiride, Simvastatin, and Paroxetine.   Medication filled x1 with no refills.   Requires office visit before any further refills can be given.   Letter sent.

## 2016-10-26 ENCOUNTER — Other Ambulatory Visit: Payer: Self-pay | Admitting: Physician Assistant

## 2016-11-19 ENCOUNTER — Other Ambulatory Visit: Payer: Self-pay | Admitting: Physician Assistant

## 2016-11-19 NOTE — Telephone Encounter (Signed)
RX filled per protocol 

## 2016-12-17 ENCOUNTER — Other Ambulatory Visit: Payer: Self-pay | Admitting: Physician Assistant

## 2016-12-18 ENCOUNTER — Other Ambulatory Visit: Payer: Self-pay

## 2016-12-18 ENCOUNTER — Other Ambulatory Visit: Payer: Self-pay | Admitting: Physician Assistant

## 2016-12-18 MED ORDER — OMEPRAZOLE 40 MG PO CPDR: 40.0000 mg | DELAYED_RELEASE_CAPSULE | Freq: Every day | ORAL | 2 refills | Status: DC

## 2016-12-18 NOTE — Telephone Encounter (Signed)
rx filled per protocol  

## 2016-12-18 NOTE — Telephone Encounter (Signed)
patient due for office visit. Letter mailed for patient to call and sch office visit

## 2017-01-14 ENCOUNTER — Other Ambulatory Visit: Payer: Self-pay

## 2017-01-14 ENCOUNTER — Encounter: Payer: Self-pay | Admitting: Physician Assistant

## 2017-01-14 ENCOUNTER — Ambulatory Visit (INDEPENDENT_AMBULATORY_CARE_PROVIDER_SITE_OTHER): Payer: BLUE CROSS/BLUE SHIELD | Admitting: Physician Assistant

## 2017-01-14 VITALS — BP 118/82 | HR 105 | Temp 98.0°F | Resp 16 | Wt 247.6 lb

## 2017-01-14 DIAGNOSIS — Z1212 Encounter for screening for malignant neoplasm of rectum: Secondary | ICD-10-CM

## 2017-01-14 DIAGNOSIS — E119 Type 2 diabetes mellitus without complications: Secondary | ICD-10-CM

## 2017-01-14 DIAGNOSIS — K219 Gastro-esophageal reflux disease without esophagitis: Secondary | ICD-10-CM

## 2017-01-14 DIAGNOSIS — E559 Vitamin D deficiency, unspecified: Secondary | ICD-10-CM | POA: Diagnosis not present

## 2017-01-14 DIAGNOSIS — M778 Other enthesopathies, not elsewhere classified: Secondary | ICD-10-CM

## 2017-01-14 DIAGNOSIS — Z125 Encounter for screening for malignant neoplasm of prostate: Secondary | ICD-10-CM

## 2017-01-14 DIAGNOSIS — R790 Abnormal level of blood mineral: Secondary | ICD-10-CM | POA: Diagnosis not present

## 2017-01-14 DIAGNOSIS — E785 Hyperlipidemia, unspecified: Secondary | ICD-10-CM | POA: Diagnosis not present

## 2017-01-14 DIAGNOSIS — M7582 Other shoulder lesions, left shoulder: Secondary | ICD-10-CM | POA: Diagnosis not present

## 2017-01-14 DIAGNOSIS — Z1211 Encounter for screening for malignant neoplasm of colon: Secondary | ICD-10-CM

## 2017-01-14 DIAGNOSIS — E538 Deficiency of other specified B group vitamins: Secondary | ICD-10-CM

## 2017-01-14 MED ORDER — METFORMIN HCL 1000 MG PO TABS: ORAL_TABLET | ORAL | 1 refills | Status: DC

## 2017-01-14 MED ORDER — SIMVASTATIN 80 MG PO TABS: ORAL_TABLET | ORAL | 1 refills | Status: DC

## 2017-01-14 NOTE — Progress Notes (Signed)
Patient ID: Chad Haynes. MRN: [id number], DOB: [birth year], 52 y.o. Date of Encounter: @DATE @  Chief Complaint:  Chief Complaint  Patient presents with  . Medication Refill    HPI: 52 y.o. year old male  presents for routine f/u OV of HTN, hyperlipidemia, diabetes.  He has not been coming in routinely for visits. Most recent visits have been with me and they were back 03/2015.  Then 05/07/16 (for CPE, R OV) At his visit 04/2016 we did labs.  A1C came back elevated at 9.6.  At that time I recommended him continue the metformin and the Amaryl.  And for him to add Trulicity, Januvia.  Today he reports that the Trulicity was going to cost $800 and could not afford that.  Regarding the Januvia-- he saw advertisements on TV regarding lawsuits with that medication so he did not take that medication.  He is currently taking both the metformin and the glimepiride.  Today he reports he has been out of the simvastatin for 1-2 weeks.  States that is why he came in for today's visit.  He had called regarding refill on that and we would give no further refills until he came in.  Is taking the lisinopril as directed.  Having no lightheadedness or other adverse effects with that.  Today he reports that his left shoulder has been bothering him.  He feels some achiness discomfort there.  Especially when he tries to do abduction with the shoulder.  This has been bothering him for about a year.  Reports that he has had injections to multiple joints of his body in the past and wants to go ahead with referral to orthopedic because he is afraid this will not get better but is agreeable to for me to give him information for stretches/exercises for him to be doing in the interim.  Today I also discussed at his last physical he was not quite age 26 so it was not quite to the recommended age for prostate cancer screening and colorectal cancer screening.  He is agreeable for me to check PSA with his labs.  Also  discussed colonoscopy and is here he is agreeable for me to go ahead with that referral.   Past Medical History:  Diagnosis Date  . Depression   . Diabetes mellitus without complication ([place])   . GERD (gastroesophageal reflux disease)   . H. pylori infection   . Hyperlipidemia   . Hypogonadism male   . Leg pain   . Low magnesium levels   . Neck pain   . Rhinitis, allergic   . Vitamin B12 deficiency   . Vitamin D deficiency   . White coat hypertension      Home Meds: Outpatient Medications Prior to Visit  Medication Sig Dispense Refill  . aspirin 81 MG tablet Take 1 tablet (81 mg total) by mouth daily. 30 tablet 11  . glimepiride (AMARYL) 4 MG tablet TAKE 1 TABLET BY MOUTH EVERY DAY WITH BREAKFAST 30 tablet 0  . glucose blood (BAYER CONTOUR NEXT TEST) test strip by Other route Two (2) times a day.    . lisinopril (PRINIVIL,ZESTRIL) 5 MG tablet TAKE 1 TABLET BY MOUTH EVERY DAY 30 tablet 5  . metFORMIN (GLUCOPHAGE) 1000 MG tablet TAKE 1 TABLET BY MOUTH TWICE A DAY WITH A MEAL 60 tablet 5  . omeprazole (PRILOSEC) 40 MG capsule Take 1 capsule (40 mg total) by mouth daily. 90 capsule 2  . PARoxetine (PAXIL) 20 MG tablet TAKE  1 TABLET BY MOUTH EVERY DAY 30 tablet 0  . simvastatin (ZOCOR) 80 MG tablet TAKE 1 TABLET BY MOUTH EVERYDAY AT BEDTIME 30 tablet 0  . Dulaglutide (TRULICITY) 1.5 ON/6.2XB SOPN Inject 1.5 mg into the skin once a week. (Patient not taking: Reported on 01/14/2017) 1 pen 3  . sitaGLIPtin (JANUVIA) 100 MG tablet Take 1 tablet (100 mg total) by mouth daily. (Patient not taking: Reported on 01/14/2017) 30 tablet 3  . tadalafil (CIALIS) 5 MG tablet Take 1 tablet (5 mg total) by mouth daily. (Patient not taking: Reported on 01/14/2017) 30 tablet 11  . metFORMIN (GLUCOPHAGE) 1000 MG tablet TAKE 1 TABLET BY MOUTH TWICE A DAY WITH A MEAL 60 tablet 5   No facility-administered medications prior to visit.     Allergies: No Known Allergies  Social History   Socioeconomic History    . Marital status: Married    Spouse name: Not on file  . Number of children: Not on file  . Years of education: Not on file  . Highest education level: Not on file  Social Needs  . Financial resource strain: Not on file  . Food insecurity - worry: Not on file  . Food insecurity - inability: Not on file  . Transportation needs - medical: Not on file  . Transportation needs - non-medical: Not on file  Occupational History  . Not on file  Tobacco Use  . Smoking status: Former Smoker    Last attempt to quit: 06/08/1991    Years since quitting: 25.6  . Smokeless tobacco: Never Used  Substance and Sexual Activity  . Alcohol use: Yes    Alcohol/week: 0.0 oz    Comment: occasionally  . Drug use: No  . Sexual activity: Yes  Other Topics Concern  . Not on file  Social History Narrative   Married. No children. Does not formal exercise.   In school at [employer] full-time. Works part-time job at night.   "Has to walk a mile from the car to classic [place]"    Family History  Problem Relation Age of Onset  . COPD Mother        smoker  . Hypertension Father      Review of Systems:  See HPI for pertinent ROS. All other ROS negative.    Physical Exam: Blood pressure 118/82, pulse (!) 105, temperature 98 F (36.7 C), temperature source Oral, resp. rate 16, weight 112.3 kg (247 lb 9.6 oz), SpO2 97 %., Body mass index is 35.02 kg/m. General: WNWD WM. Appears in no acute distress. Neck: Supple. No thyromegaly. No lymphadenopathy. Lungs: Clear bilaterally to auscultation without wheezes, rales, or rhonchi. Breathing is unlabored. Heart: RRR with S1 S2. No murmurs, rubs, or gallops. Abdomen: Soft, non-tender, non-distended with normoactive bowel sounds. No hepatomegaly. No rebound/guarding. No obvious abdominal masses. Musculoskeletal:  Strength and tone normal for age. Extremities/Skin: Warm and dry.  No LE edema.  Diabetic foot exam: Inspection is normal.  Sensation is intact. Neuro: Alert  and oriented X 3. Moves all extremities spontaneously. Gait is normal. CNII-XII grossly in tact. Psych:  Responds to questions appropriately with a normal affect.     ASSESSMENT AND PLAN:  52 y.o. year old male with  1. Gastroesophageal reflux disease, esophagitis presence not specified This is well controlled with use of omeprazole.  2. Diabetes mellitus without complication ([facility]) Recheck A1c and determine medication adjustments.  Discussed with him that in the future if he needs to stop any medication for any  reason such as cost or concerned of side effects etc. to please inform us so that we can then substitute a different medication for this and keep things under control. - COMPLETE METABOLIC PANEL WITH GFR - Hemoglobin A1c  3. Hyperlipidemia, unspecified hyperlipidemia type Is off simvastatin for about 1-2 weeks.  Therefore will not check lipid panel as a no be high.  He did have a lipid panel 04/2016.  At that time he was on that same current dose of simvastatin and LDL was 65 to can continue that dose.  Check LFTs. - COMPLETE METABOLIC PANEL WITH GFR  4. Vitamin D deficiency Check vitamin D level at lab 04/2016 and that was stable so we will wait to recheck level.  5. Low magnesium levels History of low magnesium in the past.  Will recheck now. - Magnesium  6. Vitamin B12 deficiency History of vitamin B12 deficiency in past.  Recheck now. - Vitamin B12  7. Screening for prostate cancer He had CPE 04/2016 but was not yet age 4.  Now age 73 so will check PSA. - PSA  8. Screening for colorectal cancer Had CPE 04/2016 but was not yet age 28.  Now age 54 so discussed need for colorectal cancer screening and discussed colonoscopy.  Agreeable to proceed with colonoscopy and agreeable for me to go ahead with order to GI. - Ambulatory referral to Gastroenterology  9. Left shoulder tendonitis Today I gave him a handout with stretches and exercises for him to do.  We will also  schedule follow-up with orthopedic. - AMB referral to orthopedics  Preventive care-----  For other preventive care see CPE note from 05/07/16.  Signed, [address] [place], [place], [facility] 01/14/2017 9:27 AM

## 2017-01-14 NOTE — Addendum Note (Signed)
Addended by: Dena Billet B on: 01/14/2017 09:40 AM   Modules accepted: Orders

## 2017-01-15 ENCOUNTER — Other Ambulatory Visit: Payer: Self-pay | Admitting: Physician Assistant

## 2017-01-15 LAB — COMPLETE METABOLIC PANEL WITH GFR
AG Ratio: 1.4 (calc) (ref 1.0–2.5)
ALKALINE PHOSPHATASE (APISO): 102 U/L (ref 40–115)
ALT: 49 U/L — AB (ref 9–46)
AST: 44 U/L — AB (ref 10–35)
Albumin: 4.3 g/dL (ref 3.6–5.1)
BUN: 17 mg/dL (ref 7–25)
CO2: 26 mmol/L (ref 20–32)
CREATININE: 0.86 mg/dL (ref 0.70–1.33)
Calcium: 9.4 mg/dL (ref 8.6–10.3)
Chloride: 101 mmol/L (ref 98–110)
GFR, Est African American: 117 mL/min/{1.73_m2} (ref >=60)
GFR, Est Non African American: 101 mL/min/{1.73_m2} (ref >=60)
GLUCOSE: 239 mg/dL — AB (ref 65–99)
Globulin: 3 g/dL (calc) (ref 1.9–3.7)
Potassium: 4.8 mmol/L (ref 3.5–5.3)
Sodium: 138 mmol/L (ref 135–146)
Total Bilirubin: 0.5 mg/dL (ref 0.2–1.2)
Total Protein: 7.3 g/dL (ref 6.1–8.1)

## 2017-01-15 LAB — HEMOGLOBIN A1C
HEMOGLOBIN A1C: 11.4 %{Hb} — AB (ref <5.7)
Mean Plasma Glucose: 280 (calc)
eAG (mmol/L): 15.5 (calc)

## 2017-01-15 LAB — PSA: PSA: 1 ng/mL (ref <=4.0)

## 2017-01-15 LAB — MAGNESIUM: MAGNESIUM: 1.7 mg/dL (ref 1.5–2.5)

## 2017-01-15 LAB — VITAMIN B12: Vitamin B-12: 404 pg/mL (ref 200–1100)

## 2017-01-18 ENCOUNTER — Ambulatory Visit (INDEPENDENT_AMBULATORY_CARE_PROVIDER_SITE_OTHER): Payer: Self-pay

## 2017-01-18 ENCOUNTER — Ambulatory Visit (INDEPENDENT_AMBULATORY_CARE_PROVIDER_SITE_OTHER): Payer: BLUE CROSS/BLUE SHIELD | Admitting: Orthopaedic Surgery

## 2017-01-18 ENCOUNTER — Encounter (INDEPENDENT_AMBULATORY_CARE_PROVIDER_SITE_OTHER): Payer: Self-pay | Admitting: Orthopaedic Surgery

## 2017-01-18 VITALS — Ht 70.0 in | Wt 240.0 lb

## 2017-01-18 DIAGNOSIS — M542 Cervicalgia: Secondary | ICD-10-CM | POA: Diagnosis not present

## 2017-01-18 DIAGNOSIS — M7582 Other shoulder lesions, left shoulder: Secondary | ICD-10-CM | POA: Diagnosis not present

## 2017-01-18 MED ORDER — METHYLPREDNISOLONE ACETATE 40 MG/ML IJ SUSP
40.0000 mg | INTRAMUSCULAR | Status: AC | PRN
  Administered 2017-01-18: 40 mg via INTRA_ARTICULAR

## 2017-01-18 MED ORDER — BUPIVACAINE HCL 0.25 % IJ SOLN
2.0000 mL | INTRAMUSCULAR | Status: AC | PRN
  Administered 2017-01-18: 2 mL via INTRA_ARTICULAR

## 2017-01-18 MED ORDER — LIDOCAINE HCL 2 % IJ SOLN
2.0000 mL | INTRAMUSCULAR | Status: AC | PRN
  Administered 2017-01-18: 2 mL

## 2017-01-18 NOTE — Progress Notes (Signed)
Office Visit Note   Patient: Chad Haynes.           Date of Birth: [birth year]           MRN: [id number] Visit Date: 01/18/2017              Requested by: Chad Sheldon, PA-C [address] [place], [place] [address] PCP: Chad Sheldon, PA-C   Assessment & Plan: Visit Diagnoses:  1. Rotator cuff tendinitis, left   2. Neck pain     Plan: Impression is left shoulder rotator cuff tendinitis.  Although Chad Haynes has a history of cervical spine radiculopathy, I believe the majority of Chad Haynes's symptoms are coming from his left shoulder.  For that reason we are proceeding with a subacromial cortisone injection followed by a rotator cuff exercise program.  If he is not any better in the next 2 weeks he will call and let us know we will get an MRI to assess his rotator cuff.  Follow-Up Instructions: Return if symptoms worsen or fail to improve.   Orders:  Orders Placed This Encounter  Procedures  . Large Joint Inj: L subacromial bursa  . XR Cervical Spine 2 or 3 views  . XR Shoulder Left   No orders of the defined types were placed in this encounter.     Procedures: Large Joint Inj: L subacromial bursa on 01/18/2017 3:52 PM Indications: pain Details: 22 G needle Medications: 2 mL lidocaine 2 %; 2 mL bupivacaine 0.25 %; 40 mg methylPREDNISolone acetate 40 MG/ML Outcome: tolerated well, no immediate complications Patient was prepped and draped in the usual sterile fashion.       Clinical Data: No additional findings.   Subjective: Chief Complaint  Patient presents with  . Neck - Pain  . Left Shoulder - Pain    HPI this is a pleasant 51 year old gentleman who presents to our clinic today with left shoulder pain.  This began close to a year ago without any known injury or change in activity.  All his pain is over his deltoid.  He describes this as a sharp pain intermittent in nature.  This does occur with internal rotation forward flexion abduction.  He occasionally  has pain sleeping on the affected side.  He has not tried any over-the-counter medication for this.  No numbness tingling burning.  He does note however he has a history of cervical spine pathology for which she is epidural injections for in the past.  These were done at [facility] several years back.    Review of Systems as detailed in HPI.  All others reviewed and are negative.   Objective: Vital Signs: Ht 5\' 10"  (1.778 m)   Wt 240 lb (108.9 kg)   BMI 34.44 kg/m   Physical Exam well-developed well-nourished gentleman in no acute distress.  Alert and oriented x3.  Ortho Exam examination of his left shoulder reveals active range of motion 150 degrees without pain he can get to 180 of forward flexion but this is fairly painful.  He can internally rotate to his back pocket.  Markedly positive empty can negative cross body negative drop arm examination of his neck reveals limited range of motion in all planes.  Specialty Comments:  No specialty comments available.  Imaging: Xr Cervical Spine 2 Or 3 Views  Result Date: 01/18/2017 Imaging of the cervical spine reveals marked degenerative changes C5-6 and C6-7 with significant anterior spurring.  Xr Shoulder Left  Result Date: 01/18/2017  Imaging of the left shoulder reveals a type II acromion.  Moderate AC degenerative changes.  I feel the humeral space.Migration humeral head.    PMFS History: Patient Active Problem List   Diagnosis Date Noted  . Rotator cuff tendinitis, left 01/18/2017  . Erectile dysfunction 05/07/2016  . Non compliance with medical treatment 04/04/2015  . Obesity 04/30/2014  . Low magnesium levels 12/04/2012  . Vitamin B12 deficiency 12/04/2012  . Vitamin D deficiency 09/28/2012  . Depression   . GERD (gastroesophageal reflux disease)   . Diabetes mellitus without complication ([place])   . Hyperlipidemia    Past Medical History:  Diagnosis Date  . Depression   . Diabetes mellitus without  complication ([place])   . GERD (gastroesophageal reflux disease)   . H. pylori infection   . Hyperlipidemia   . Hypogonadism male   . Leg pain   . Low magnesium levels   . Neck pain   . Rhinitis, allergic   . Vitamin B12 deficiency   . Vitamin D deficiency   . White coat hypertension     Family History  Problem Relation Age of Onset  . COPD Mother        smoker  . Hypertension Father     Past Surgical History:  Procedure Laterality Date  . KNEE ARTHROSCOPY Right    Social History   Occupational History  . Not on file  Tobacco Use  . Smoking status: Former Smoker    Last attempt to quit: 06/08/1991    Years since quitting: 25.6  . Smokeless tobacco: Never Used  Substance and Sexual Activity  . Alcohol use: Yes    Alcohol/week: 0.0 oz    Comment: occasionally  . Drug use: No  . Sexual activity: Yes

## 2017-01-21 ENCOUNTER — Telehealth: Payer: Self-pay

## 2017-01-21 NOTE — Telephone Encounter (Signed)
-----   Message from Orlena Sheldon, PA-C sent at 01/15/2017  7:42 AM EST ----- A1C 11.4. The only way to get this controlled, is to use Insulin. Schedule OV for 30 minute slot to discuss / start this.

## 2017-01-21 NOTE — Telephone Encounter (Signed)
Called placed to patient about his lab results. Patient states he would need to check his schedule to see when he would be able to come in as well as talk with his wife.Patient has an appointment in April and would like to discuss the insulin then however I informed patient we needed to go ahead and start insulin now. Patient stated he would call back and schedule.

## 2017-02-01 ENCOUNTER — Ambulatory Visit: Payer: BLUE CROSS/BLUE SHIELD | Admitting: Physician Assistant

## 2017-02-15 ENCOUNTER — Encounter: Payer: Self-pay | Admitting: Physician Assistant

## 2017-03-05 ENCOUNTER — Other Ambulatory Visit: Payer: Self-pay

## 2017-03-05 MED ORDER — LISINOPRIL 5 MG PO TABS: 5.0000 mg | ORAL_TABLET | Freq: Every day | ORAL | 5 refills | Status: DC

## 2017-04-01 ENCOUNTER — Other Ambulatory Visit: Payer: Self-pay | Admitting: Physician Assistant

## 2017-04-08 ENCOUNTER — Ambulatory Visit (INDEPENDENT_AMBULATORY_CARE_PROVIDER_SITE_OTHER): Payer: BLUE CROSS/BLUE SHIELD | Admitting: Orthopaedic Surgery

## 2017-04-08 ENCOUNTER — Encounter (INDEPENDENT_AMBULATORY_CARE_PROVIDER_SITE_OTHER): Payer: Self-pay | Admitting: Orthopaedic Surgery

## 2017-04-08 DIAGNOSIS — M7582 Other shoulder lesions, left shoulder: Secondary | ICD-10-CM

## 2017-04-08 NOTE — Progress Notes (Signed)
Chad Haynes comes in for follow-up of his left shoulder.  We saw him on 01/18/2017 where he had been having left shoulder/deltoid pain for approximately 1 year.  We injected his subacromial space with cortisone which moderately helped but only lasted for about a week.  He was also given a Job exercise program at that appointment.  No significant relief from the exercises either.  He has not had an MRI of his left shoulder, so at this point we will obtain this to assess his rotator cuff.  He will follow-up with [place] once that is completed.  He will call if concerns or questions in the meantime.

## 2017-04-14 ENCOUNTER — Encounter: Payer: Self-pay | Admitting: Physician Assistant

## 2017-04-14 ENCOUNTER — Ambulatory Visit: Payer: BLUE CROSS/BLUE SHIELD | Admitting: Physician Assistant

## 2017-04-15 ENCOUNTER — Other Ambulatory Visit: Payer: Self-pay

## 2017-04-16 ENCOUNTER — Telehealth (INDEPENDENT_AMBULATORY_CARE_PROVIDER_SITE_OTHER): Payer: Self-pay

## 2017-04-16 NOTE — Telephone Encounter (Signed)
I saw patient is on the sched for Monday April 8th. It looks like MRI was CX on 04/15/17.   Want to see why MRI  was cx or what happened.   Called patient no answer LMOM to call us back.

## 2017-04-16 NOTE — Telephone Encounter (Signed)
It looks like he called [facility] and cancelled d/t financial reasons.

## 2017-04-19 ENCOUNTER — Ambulatory Visit (INDEPENDENT_AMBULATORY_CARE_PROVIDER_SITE_OTHER): Payer: BLUE CROSS/BLUE SHIELD | Admitting: Orthopaedic Surgery

## 2017-04-21 DIAGNOSIS — H25013 Cortical age-related cataract, bilateral: Secondary | ICD-10-CM | POA: Diagnosis not present

## 2017-04-21 DIAGNOSIS — E119 Type 2 diabetes mellitus without complications: Secondary | ICD-10-CM | POA: Diagnosis not present

## 2017-04-21 DIAGNOSIS — H2513 Age-related nuclear cataract, bilateral: Secondary | ICD-10-CM | POA: Diagnosis not present

## 2017-04-29 ENCOUNTER — Other Ambulatory Visit: Payer: Self-pay | Admitting: Physician Assistant

## 2017-06-19 ENCOUNTER — Other Ambulatory Visit: Payer: Self-pay | Admitting: Physician Assistant

## 2017-07-19 ENCOUNTER — Other Ambulatory Visit: Payer: Self-pay | Admitting: Physician Assistant

## 2017-08-02 ENCOUNTER — Encounter: Payer: Self-pay | Admitting: Physician Assistant

## 2017-08-17 ENCOUNTER — Other Ambulatory Visit: Payer: Self-pay | Admitting: Physician Assistant

## 2017-08-20 DIAGNOSIS — T1502XA Foreign body in cornea, left eye, initial encounter: Secondary | ICD-10-CM | POA: Diagnosis not present

## 2017-08-20 DIAGNOSIS — H53132 Sudden visual loss, left eye: Secondary | ICD-10-CM | POA: Diagnosis not present

## 2017-08-20 DIAGNOSIS — H5712 Ocular pain, left eye: Secondary | ICD-10-CM | POA: Diagnosis not present

## 2017-08-22 ENCOUNTER — Other Ambulatory Visit: Payer: Self-pay | Admitting: Physician Assistant

## 2017-08-23 NOTE — Telephone Encounter (Signed)
Patient is due for an office visit letter mailed for patient to call and schedule the appointment.

## 2017-09-22 ENCOUNTER — Other Ambulatory Visit: Payer: Self-pay | Admitting: Physician Assistant

## 2017-09-30 ENCOUNTER — Other Ambulatory Visit: Payer: Self-pay | Admitting: Physician Assistant

## 2017-10-23 ENCOUNTER — Other Ambulatory Visit: Payer: Self-pay | Admitting: Physician Assistant

## 2017-10-24 ENCOUNTER — Other Ambulatory Visit: Payer: Self-pay | Admitting: Physician Assistant

## 2017-10-28 ENCOUNTER — Other Ambulatory Visit: Payer: Self-pay | Admitting: Physician Assistant

## 2017-11-10 ENCOUNTER — Telehealth: Payer: Self-pay | Admitting: Physician Assistant

## 2017-11-10 MED ORDER — METFORMIN HCL 1000 MG PO TABS: ORAL_TABLET | ORAL | 1 refills | Status: DC
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
## 2017-11-10 NOTE — Telephone Encounter (Signed)
rx sent to pharmacy

## 2017-11-12 ENCOUNTER — Other Ambulatory Visit: Payer: Self-pay

## 2017-11-12 ENCOUNTER — Encounter: Payer: Self-pay | Admitting: Family Medicine

## 2017-11-12 ENCOUNTER — Ambulatory Visit (INDEPENDENT_AMBULATORY_CARE_PROVIDER_SITE_OTHER): Payer: BLUE CROSS/BLUE SHIELD | Admitting: Family Medicine

## 2017-11-12 VITALS — BP 132/64 | HR 80 | Temp 98.6°F | Resp 16 | Ht 70.0 in | Wt 243.0 lb

## 2017-11-12 DIAGNOSIS — S161XXA Strain of muscle, fascia and tendon at neck level, initial encounter: Secondary | ICD-10-CM | POA: Diagnosis not present

## 2017-11-12 DIAGNOSIS — E119 Type 2 diabetes mellitus without complications: Secondary | ICD-10-CM | POA: Diagnosis not present

## 2017-11-12 DIAGNOSIS — S63601A Unspecified sprain of right thumb, initial encounter: Secondary | ICD-10-CM

## 2017-11-12 DIAGNOSIS — Z6834 Body mass index (BMI) 34.0-34.9, adult: Secondary | ICD-10-CM | POA: Diagnosis not present

## 2017-11-12 DIAGNOSIS — S20212A Contusion of left front wall of thorax, initial encounter: Secondary | ICD-10-CM | POA: Diagnosis not present

## 2017-11-12 DIAGNOSIS — E785 Hyperlipidemia, unspecified: Secondary | ICD-10-CM

## 2017-11-12 DIAGNOSIS — E669 Obesity, unspecified: Secondary | ICD-10-CM

## 2017-11-12 DIAGNOSIS — E66811 Obesity, class 1: Secondary | ICD-10-CM

## 2017-11-12 MED ORDER — CYCLOBENZAPRINE HCL 5 MG PO TABS: 5.0000 mg | ORAL_TABLET | Freq: Three times a day (TID) | ORAL | 0 refills | Status: DC | PRN
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
## 2017-11-13 LAB — COMPLETE METABOLIC PANEL WITH GFR
AG Ratio: 1.6 (calc) (ref 1.0–2.5)
ALT: 30 U/L (ref 9–46)
AST: 26 U/L (ref 10–35)
Albumin: 4.4 g/dL (ref 3.6–5.1)
Alkaline phosphatase (APISO): 100 U/L (ref 40–115)
BUN: 12 mg/dL (ref 7–25)
CALCIUM: 9.2 mg/dL (ref 8.6–10.3)
CO2: 24 mmol/L (ref 20–32)
Chloride: 100 mmol/L (ref 98–110)
Creat: 0.88 mg/dL (ref 0.70–1.33)
GFR, EST AFRICAN AMERICAN: 115 mL/min/{1.73_m2} (ref >=60)
GFR, EST NON AFRICAN AMERICAN: 99 mL/min/{1.73_m2} (ref >=60)
GLUCOSE: 285 mg/dL — AB (ref 65–99)
Globulin: 2.7 g/dL (calc) (ref 1.9–3.7)
Potassium: 4.5 mmol/L (ref 3.5–5.3)
Sodium: 136 mmol/L (ref 135–146)
TOTAL PROTEIN: 7.1 g/dL (ref 6.1–8.1)
Total Bilirubin: 0.4 mg/dL (ref 0.2–1.2)

## 2017-11-13 LAB — HEMOGLOBIN A1C
EAG (MMOL/L): 16.8 (calc)
Hgb A1c MFr Bld: 12.2 % of total Hgb — ABNORMAL HIGH (ref <5.7)
MEAN PLASMA GLUCOSE: 303 (calc)

## 2017-11-13 LAB — LIPID PANEL
Cholesterol: 157 mg/dL (ref <200)
HDL: 29 mg/dL — ABNORMAL LOW (ref >40)
LDL Cholesterol (Calc): 89 mg/dL (calc)
Non-HDL Cholesterol (Calc): 128 mg/dL (calc) (ref <130)
Total CHOL/HDL Ratio: 5.4 (calc) — ABNORMAL HIGH (ref <5.0)
Triglycerides: 327 mg/dL — ABNORMAL HIGH (ref <150)

## 2017-11-15 ENCOUNTER — Other Ambulatory Visit: Payer: Self-pay | Admitting: *Deleted

## 2017-11-15 DIAGNOSIS — M25512 Pain in left shoulder: Secondary | ICD-10-CM | POA: Diagnosis not present

## 2017-11-15 DIAGNOSIS — M25521 Pain in right elbow: Secondary | ICD-10-CM | POA: Diagnosis not present

## 2017-11-15 DIAGNOSIS — M542 Cervicalgia: Secondary | ICD-10-CM | POA: Diagnosis not present

## 2017-11-15 MED ORDER — BLOOD GLUCOSE TEST VI STRP: ORAL_STRIP | 1 refills | Status: DC

## 2017-11-15 MED ORDER — LANCETS MISC: 1 refills | Status: DC

## 2017-11-15 MED ORDER — BLOOD GLUCOSE SYSTEM PAK KIT: PACK | 1 refills | Status: DC

## 2017-11-21 ENCOUNTER — Other Ambulatory Visit: Payer: Self-pay | Admitting: Physician Assistant

## 2017-11-23 NOTE — Telephone Encounter (Signed)
We can refill lisinopril and paroxetine I will be adjusting his diabetes medicine at his upcoming visit so will not need refill of amaryl

## 2017-11-25 ENCOUNTER — Ambulatory Visit: Payer: BLUE CROSS/BLUE SHIELD | Admitting: Family Medicine

## 2017-12-01 ENCOUNTER — Encounter: Payer: Self-pay | Admitting: Physician Assistant

## 2017-12-01 DIAGNOSIS — M7711 Lateral epicondylitis, right elbow: Secondary | ICD-10-CM | POA: Diagnosis not present

## 2018-01-20 ENCOUNTER — Ambulatory Visit (INDEPENDENT_AMBULATORY_CARE_PROVIDER_SITE_OTHER): Payer: BLUE CROSS/BLUE SHIELD | Admitting: Family Medicine

## 2018-01-20 ENCOUNTER — Encounter: Payer: Self-pay | Admitting: Family Medicine

## 2018-01-20 VITALS — BP 120/84 | HR 97 | Temp 98.4°F | Resp 16 | Ht 70.0 in | Wt 242.0 lb

## 2018-01-20 DIAGNOSIS — E785 Hyperlipidemia, unspecified: Secondary | ICD-10-CM | POA: Diagnosis not present

## 2018-01-20 DIAGNOSIS — Z6834 Body mass index (BMI) 34.0-34.9, adult: Secondary | ICD-10-CM | POA: Diagnosis not present

## 2018-01-20 DIAGNOSIS — E1165 Type 2 diabetes mellitus with hyperglycemia: Secondary | ICD-10-CM | POA: Diagnosis not present

## 2018-01-20 DIAGNOSIS — E669 Obesity, unspecified: Secondary | ICD-10-CM

## 2018-01-20 DIAGNOSIS — E119 Type 2 diabetes mellitus without complications: Secondary | ICD-10-CM | POA: Diagnosis not present

## 2018-01-20 LAB — GLUCOSE 16585: Glucose: 290 mg/dL — ABNORMAL HIGH (ref 65–99)

## 2018-01-20 MED ORDER — BASAGLAR KWIKPEN 100 UNIT/ML ~~LOC~~ SOPN: 10.0000 [IU] | PEN_INJECTOR | Freq: Every day | SUBCUTANEOUS | 2 refills | Status: DC
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
## 2018-01-20 NOTE — Patient Instructions (Signed)
Stop glimiperide  Start Basaglar   Let me know what the pharmacy says about coverage (or other meds covered better in same class of meds)  Call in 2 weeks and please give [place] sugar log (or drop off)   Follow up one month to recheck sugars and redo labs. Bring in meter or log  Dose adjustment parameters written out for you on diabetes handouts.

## 2018-01-25 ENCOUNTER — Telehealth: Payer: Self-pay | Admitting: Family Medicine

## 2018-01-25 ENCOUNTER — Telehealth: Payer: Self-pay | Admitting: *Deleted

## 2018-01-25 MED ORDER — INSULIN DETEMIR 100 UNIT/ML FLEXPEN: PEN_INJECTOR | SUBCUTANEOUS | 2 refills | Status: DC

## 2018-01-25 NOTE — Telephone Encounter (Signed)
Received a prior authorization from patient's insurance for the [employer]. PA was submitted and Basaglar was denied. Discussed with Laurell Roof and was told to send in Levemir to patient's pharmacy with same instructions. Will send in Levemir to see if insurance will cover it.

## 2018-01-25 NOTE — Telephone Encounter (Signed)
Received call from patient wife, Sharyn Lull.   Reports that he has been taking Badaglar since Thursday, but has not noticed a difference with his sugars.   Advised to continue x1 more week and then call with readings to further titrate.

## 2018-01-26 ENCOUNTER — Encounter: Payer: Self-pay | Admitting: Family Medicine

## 2018-01-26 MED ORDER — INSULIN PEN NEEDLE 32G X 4 MM MISC: 1 refills | Status: DC

## 2018-01-26 NOTE — Assessment & Plan Note (Signed)
Uncontrolled per pt report and last labs over 2 months ago, he was instructed to come in to address DM, and was lost to follow up Today POC CBG 290 Samples given of Basaglar, pt educated on use and administration.  Will stop amaryl and start basal insulin.  I wrote out in detail titration for fasting CBG. Insulin rx sent to pharnacy 2 week follow up, he has blood sugar log, mutliple handouts about DM, chart for dose titration, and he is to return in 2 weeks with his meter and sugar log to recheck.  Future labs ordered for DM, A1C, CMP Pt on ACEI and ASA Will need to address HM care gaps at f/up appt

## 2018-01-26 NOTE — Assessment & Plan Note (Signed)
Compliant with meds, no SE, due to recheck [place] Feb 2020

## 2018-02-15 ENCOUNTER — Ambulatory Visit: Payer: BLUE CROSS/BLUE SHIELD | Admitting: Family Medicine

## 2018-02-22 ENCOUNTER — Encounter: Payer: Self-pay | Admitting: Family Medicine

## 2018-02-22 ENCOUNTER — Ambulatory Visit (INDEPENDENT_AMBULATORY_CARE_PROVIDER_SITE_OTHER): Payer: BLUE CROSS/BLUE SHIELD | Admitting: Family Medicine

## 2018-02-22 VITALS — BP 120/72 | HR 89 | Temp 98.5°F | Resp 16 | Ht 70.0 in | Wt 239.0 lb

## 2018-02-22 DIAGNOSIS — E669 Obesity, unspecified: Secondary | ICD-10-CM

## 2018-02-22 DIAGNOSIS — N529 Male erectile dysfunction, unspecified: Secondary | ICD-10-CM | POA: Diagnosis not present

## 2018-02-22 DIAGNOSIS — Z114 Encounter for screening for human immunodeficiency virus [HIV]: Secondary | ICD-10-CM | POA: Diagnosis not present

## 2018-02-22 DIAGNOSIS — E785 Hyperlipidemia, unspecified: Secondary | ICD-10-CM

## 2018-02-22 DIAGNOSIS — Z6834 Body mass index (BMI) 34.0-34.9, adult: Secondary | ICD-10-CM

## 2018-02-22 DIAGNOSIS — E1165 Type 2 diabetes mellitus with hyperglycemia: Secondary | ICD-10-CM | POA: Diagnosis not present

## 2018-02-22 MED ORDER — EMPAGLIFLOZIN 10 MG PO TABS: 10.0000 mg | ORAL_TABLET | Freq: Every day | ORAL | 2 refills | Status: DC

## 2018-02-22 NOTE — Progress Notes (Signed)
Patient ID: Chad Pod., male    DOB: 14-May-1965, 53 y.o.   MRN: [id number]  PCP: Delsa Grana, PA-C  Chief Complaint  Patient presents with  . Diabetes    Patient in today for a 3 month follow up DM. Has c/o of having a sugar at 22 recently. Is not fasting     Subjective:   Chad Haynes. is a 53 y.o. male, presents to clinic with CC of f/up for uncontrolled DM, started insulin roughly 1 month ago.  He came to the appointment in January after delayed follow-up of a visit from last November where lab work showed he was uncontrolled and he was encouraged to come back in right away to adjust medications.  He did not follow-up follow-up and instructions given to him from his appointment a month ago he does not have blood sugar log with him or the meter.  He had contacted [place] a few weeks ago stating that his blood sugars were not better but he had not titrated his insulin as he was instructed to that was reviewed with him over the phone.  He states that his fasting blood sugars are improving but they range 200-290, and have never gone below 200.  He also had a sugar recently that was over 500.  He is currently giving 25 units now at bedtime, still using the sample toujeo that he was given in clinic.  He is also taking metformin 1000 mg BID, states he is compliant with both meds.  Due to varying schedule where he sometimes leaves very early in the morning sometimes stays out very late at night he has not been able to give his insulin at a consistent time each day but he is doing it in the evenings and checking his blood sugars in the morning.  Working on his diet -states he cut out sugars in his drinks, has switched to diet dr pepper, eating salads for lunch most days, slim fast shakes for diabetics.  He reports that he has lost some weight, his energy is good, no longer feels fatigued all the time, he does not have polyuria or nocturia anymore.  Denies polydipsia.  While walking him out of the  exam room to check out - he asks "can I get some Cialis?"  Says he has not been on it before, and he vaguely endorses some erectile dysfunction.  I explained to him that it is a large work-up that involves checking some additional things in his lab work like testosterone and needs to have a visit dedicated to discussing it things like medications that he is already on, how long it has been going on for another details about ED, also more detailed vascular exam, and his DM may have something to do with it.   He is due for fasting labs to be rechecked, he ate today and he agrees to come back for fasting labs and to schedule follow up appt to discuss ED more.     Patient Active Problem List   Diagnosis Date Noted  . Rotator cuff tendinitis, left 01/18/2017  . Erectile dysfunction 05/07/2016  . Non compliance with medical treatment 04/04/2015  . Obesity 04/30/2014  . Low magnesium levels 12/04/2012  . Vitamin B12 deficiency 12/04/2012  . Vitamin D deficiency 09/28/2012  . Depression   . GERD (gastroesophageal reflux disease)   . Diabetes mellitus without complication ([place])   . Hyperlipidemia      Prior to Admission medications  Medication Sig Start Date End Date Taking? Authorizing Provider  Insulin Detemir (LEVEMIR) 100 UNIT/ML Pen Inject 0.1-0.3 ml's (10-30 units) into skin at bedtime. 01/25/18  Yes Delsa Grana, PA-C  Insulin Pen Needle 32G X 4 MM MISC Use as directed with insulin daily 01/26/18  Yes Delsa Grana, PA-C  Lancets MISC Please dispense based on patient and insurance preference. Use as directed to monitor FSBS 2x daily. Dx: E11.9 11/15/17  Yes Delsa Grana, PA-C  lisinopril (PRINIVIL,ZESTRIL) 5 MG tablet TAKE 1 TABLET BY MOUTH EVERY DAY NEEDS OFFICE VISIT 11/24/17  Yes Delsa Grana, PA-C  metFORMIN (GLUCOPHAGE) 1000 MG tablet TAKE 1 TABLET BY MOUTH TWICE A DAY WITH A MEAL 11/10/17  Yes Susy Frizzle, MD  omeprazole (PRILOSEC) 40 MG capsule Take 1 capsule (40 mg total) by mouth  daily. 12/18/16  Yes Dena Billet B, PA-C  PARoxetine (PAXIL) 20 MG tablet TAKE 1 TABLET BY MOUTH EVERY DAY 11/24/17  Yes Delsa Grana, PA-C  simvastatin (ZOCOR) 80 MG tablet TAKE 1 TABLET BY MOUTH EVERY DAY 08/23/17  Yes Orlena Sheldon, PA-C  aspirin 81 MG tablet Take 1 tablet (81 mg total) by mouth daily. Patient not taking: Reported on 02/22/2018 05/07/16   Dena Billet B, PA-C  Blood Glucose Monitoring Suppl (BLOOD GLUCOSE SYSTEM PAK) KIT Please dispense based on patient and insurance preference. Use as directed to monitor FSBS 2x daily. Dx: E11.9 11/15/17   Delsa Grana, PA-C  Glucose Blood (BLOOD GLUCOSE TEST STRIPS) STRP Please dispense based on patient and insurance preference. Use as directed to monitor FSBS 2x daily. Dx: E11.9 11/15/17   Delsa Grana, PA-C     No Known Allergies   Family History  Problem Relation Age of Onset  . COPD Mother        smoker  . Hypertension Father      Social History   Socioeconomic History  . Marital status: Married    Spouse name: Not on file  . Number of children: Not on file  . Years of education: Not on file  . Highest education level: Not on file  Occupational History  . Not on file  Social Needs  . Financial resource strain: Not on file  . Food insecurity:    Worry: Not on file    Inability: Not on file  . Transportation needs:    Medical: Not on file    Non-medical: Not on file  Tobacco Use  . Smoking status: Former Smoker    Last attempt to quit: 06/08/1991    Years since quitting: 26.7  . Smokeless tobacco: Never Used  Substance and Sexual Activity  . Alcohol use: Yes    Alcohol/week: 0.0 standard drinks    Comment: occasionally  . Drug use: No  . Sexual activity: Yes  Lifestyle  . Physical activity:    Days per week: Not on file    Minutes per session: Not on file  . Stress: Not on file  Relationships  . Social connections:    Talks on phone: Not on file    Gets together: Not on file    Attends religious service: Not  on file    Active member of club or organization: Not on file    Attends meetings of clubs or organizations: Not on file    Relationship status: Not on file  . Intimate partner violence:    Fear of current or ex partner: Not on file    Emotionally abused: Not on file  Physically abused: Not on file    Forced sexual activity: Not on file  Other Topics Concern  . Not on file  Social History Narrative   Married. No children. Does not formal exercise.   In school at [employer] full-time. Works part-time job at night.   "Has to walk a mile from the car to classic [place]"   Review of Systems  Constitutional: Negative.   HENT: Negative.   Eyes: Negative.   Respiratory: Negative.   Cardiovascular: Negative.   Gastrointestinal: Negative.   Endocrine: Negative.   Genitourinary: Negative.   Musculoskeletal: Negative.   Skin: Negative.   Allergic/Immunologic: Negative.   Neurological: Negative.   Hematological: Negative.   Psychiatric/Behavioral: Negative.   All other systems reviewed and are negative.      Objective:    Vitals:   02/22/18 1205  BP: 120/72  Pulse: 89  Resp: 16  Temp: 98.5 F (36.9 C)  TempSrc: Oral  SpO2: 96%  Weight: 239 lb (108.4 kg)  Height: _0  (1.778 m)      Physical Exam Vitals signs and nursing note reviewed.  Constitutional:      General: He is not in acute distress.    Appearance: He is well-developed. He is not ill-appearing, toxic-appearing or diaphoretic.  HENT:     Head: Normocephalic and atraumatic.     Right Ear: External ear normal.     Left Ear: External ear normal.     Nose: Nose normal.     Mouth/Throat:     Mouth: Mucous membranes are moist.     Pharynx: Oropharynx is clear.  Eyes:     General: No scleral icterus.       Right eye: No discharge.        Left eye: No discharge.     Conjunctiva/sclera: Conjunctivae normal.  Neck:     Trachea: No tracheal deviation.  Cardiovascular:     Rate and Rhythm: Normal rate and regular  rhythm.  Pulmonary:     Effort: Pulmonary effort is normal. No respiratory distress.     Breath sounds: No stridor.  Musculoskeletal: Normal range of motion.  Skin:    General: Skin is warm and dry.     Coloration: Skin is not jaundiced or pale.     Findings: No rash.  Neurological:     Mental Status: He is alert.     Motor: No abnormal muscle tone.     Coordination: Coordination normal.  Psychiatric:        Mood and Affect: Mood normal.        Behavior: Behavior normal.           Assessment & Plan:   Patient returns for recheck of blood sugars after starting insulin he is currently using toujeo sample pen and giving 25 units once daily at night, does not bring in his meter or any logs but he says his sugars are usually 200-300 fasting and they have never gone below 200.  He is continued to take his metformin without any side effects, he is doing well with the insulin injections, no side effects concerns, he is having a little bit of difficulty giving at a consistent time a day.  He is slowly titrated up from 10 units to 25 any has done this over the past 2 weeks. He was able to get a different long-acting insulin at the pharmacy Levemir but he has not used that yet.  Most of his symptoms of hyperglycemia have resolved and  he feels good has no other complaints other than he briefly asked for erectile dysfunction medication when he was leaving the room today, some labs were put in for that for his other lab that he is due for and he will come back we will discuss it further at another appointment.  He was given a sample of Jardiance - 2 weeks worth, Rx sent to pharmacy he needs to check for coveraged if not affordable - as this seems to be a large part of why he is non-compliant.  I discussed with him at length that I will send the prescription to the pharmacy he has 2 weeks time to be able to figure out if he can afford it if it is covered by insurance and if not he needs to call his  insurance company and inquire further about prescription coverage and then let me know what they cover better so I can prescribe another medicine that he can afford.   He seems to be doing well with his dietary changes, he is not yet exercising but he was encouraged to add that on when he can.  He is also encouraged to switch his insulin dosing time from p.m. until a.m. and to try and consistently give it 1 time a day, check a fasting blood sugar and evening blood sugar and bring his meter with him at the next appointment.  He is due for repeat A1c, fasting lipids and CMP but he will come in the next few days fasting to get these done.  He has been lost to follow-up before, for example in November his A1c was significantly higher than what it had been, he was instructed to come in immediately to address and he came in more than 2 months later, complaining of weakness fatigue polyuria polydipsia weight loss and hyperglycemia.   I wrote out blood sugar ranges and dose titration and timing - gave him DM educational hand outs at his last visit 01/20/2018 and he called 2 weeks later complaining of hyperglycemia - did not titrate insulin dose.    Briefly reviewed HM and care gaps and future labs set up to address what we can.   He refuses colonoscopy, refuses flu shot. Due for foot exam and eye exam - however for eye exam we were waiting to get blood sugar more controlled - will do at next recheck appt.     ICD-10-CM   1. Uncontrolled type 2 diabetes mellitus with hyperglycemia (HCC) E11.65 Hemoglobin A1c    Lipid panel    Microalbumin, urine    COMPLETE METABOLIC PANEL WITH GFR  2. Erectile dysfunction, unspecified erectile dysfunction type N52.9 PSA    Testosterone    CANCELED: Testosterone    CANCELED: PSA  3. Screening for HIV without presence of risk factors Z11.4 HIV Antibody (routine testing w rflx)  4. Class 1 obesity with body mass index (BMI) of 34.0 to 34.9 in adult, unspecified obesity type,  unspecified whether serious comorbidity present E66.9 Hemoglobin A1c   Z68.34 Lipid panel  5. Hyperlipidemia, unspecified hyperlipidemia type E78.5 Hemoglobin A1c    COMPLETE METABOLIC PANEL WITH GFR       Delsa Grana, PA-C 02/22/18 12:11 PM

## 2018-02-24 ENCOUNTER — Encounter: Payer: Self-pay | Admitting: Family Medicine

## 2018-02-24 ENCOUNTER — Telehealth: Payer: Self-pay | Admitting: Family Medicine

## 2018-02-24 NOTE — Telephone Encounter (Signed)
The patient needs to call his insurance/prescription plan and ask them what meds are covered and not covered.   I explained this to him at the appointment-  that we do not have that information, and he was to follow up and call and find out if the jardiance is not covered Jardiance is in a class of meds called SGLT2 inhibitors - they can call and ask if any of them are covered and then let us know which one, if any, and we will prescribe it.   It would be helpful if they also ask what is covered for GLP-1 meds and the long acting insulin (toujeo, levemir, basaglar, tresiba).  Thank you

## 2018-02-24 NOTE — Telephone Encounter (Signed)
Would you like to change [place] to something else?

## 2018-02-24 NOTE — Telephone Encounter (Signed)
Patients wife calling to say that the jardience was too expensive ([phone]), please give her a call regarding this  [phone]

## 2018-02-25 NOTE — Telephone Encounter (Signed)
Spoke with patient and informed him that he would need to call his insurance to see what is covered as far as SGLT2 inhibitors and GLP-1 medications. Patient verbalized understanding.

## 2018-03-08 ENCOUNTER — Other Ambulatory Visit: Payer: BLUE CROSS/BLUE SHIELD

## 2018-03-10 ENCOUNTER — Other Ambulatory Visit: Payer: BLUE CROSS/BLUE SHIELD

## 2018-03-10 DIAGNOSIS — E785 Hyperlipidemia, unspecified: Secondary | ICD-10-CM

## 2018-03-10 DIAGNOSIS — E1165 Type 2 diabetes mellitus with hyperglycemia: Secondary | ICD-10-CM

## 2018-03-10 DIAGNOSIS — Z6834 Body mass index (BMI) 34.0-34.9, adult: Secondary | ICD-10-CM

## 2018-03-10 DIAGNOSIS — N529 Male erectile dysfunction, unspecified: Secondary | ICD-10-CM

## 2018-03-10 DIAGNOSIS — Z114 Encounter for screening for human immunodeficiency virus [HIV]: Secondary | ICD-10-CM

## 2018-03-10 DIAGNOSIS — E669 Obesity, unspecified: Secondary | ICD-10-CM

## 2018-03-11 LAB — CBC WITH DIFFERENTIAL/PLATELET
Absolute Monocytes: 414 cells/uL (ref 200–950)
BASOS ABS: 50 {cells}/uL (ref 0–200)
Basophils Relative: 0.9 %
EOS ABS: 179 {cells}/uL (ref 15–500)
Eosinophils Relative: 3.2 %
HCT: 43.7 % (ref 38.5–50.0)
HEMOGLOBIN: 14.3 g/dL (ref 13.2–17.1)
Lymphs Abs: 1389 cells/uL (ref 850–3900)
MCH: 27.5 pg (ref 27.0–33.0)
MCHC: 32.7 g/dL (ref 32.0–36.0)
MCV: 84 fL (ref 80.0–100.0)
MPV: 10.1 fL (ref 7.5–12.5)
Monocytes Relative: 7.4 %
Neutro Abs: 3567 cells/uL (ref 1500–7800)
Neutrophils Relative %: 63.7 %
Platelets: 351 10*3/uL (ref 140–400)
RBC: 5.2 10*6/uL (ref 4.20–5.80)
RDW: 13.2 % (ref 11.0–15.0)
Total Lymphocyte: 24.8 %
WBC: 5.6 10*3/uL (ref 3.8–10.8)

## 2018-03-11 LAB — COMPLETE METABOLIC PANEL WITH GFR
AG Ratio: 1.5 (calc) (ref 1.0–2.5)
ALT: 15 U/L (ref 9–46)
AST: 13 U/L (ref 10–35)
Albumin: 4.3 g/dL (ref 3.6–5.1)
Alkaline phosphatase (APISO): 90 U/L (ref 35–144)
BUN: 16 mg/dL (ref 7–25)
CALCIUM: 9.3 mg/dL (ref 8.6–10.3)
CO2: 23 mmol/L (ref 20–32)
CREATININE: 0.88 mg/dL (ref 0.70–1.33)
Chloride: 104 mmol/L (ref 98–110)
GFR, Est African American: 115 mL/min/{1.73_m2} (ref >=60)
GFR, Est Non African American: 99 mL/min/{1.73_m2} (ref >=60)
Globulin: 2.8 g/dL (calc) (ref 1.9–3.7)
Glucose, Bld: 249 mg/dL — ABNORMAL HIGH (ref 65–99)
Potassium: 4.4 mmol/L (ref 3.5–5.3)
Sodium: 139 mmol/L (ref 135–146)
Total Bilirubin: 0.4 mg/dL (ref 0.2–1.2)
Total Protein: 7.1 g/dL (ref 6.1–8.1)

## 2018-03-11 LAB — MICROALBUMIN, URINE: Microalb, Ur: 0.5 mg/dL

## 2018-03-11 LAB — LIPID PANEL
Cholesterol: 130 mg/dL (ref <200)
HDL: 26 mg/dL — ABNORMAL LOW (ref >=40)
LDL Cholesterol (Calc): 79 mg/dL (calc)
Non-HDL Cholesterol (Calc): 104 mg/dL (calc) (ref <130)
TRIGLYCERIDES: 153 mg/dL — AB (ref <150)
Total CHOL/HDL Ratio: 5 (calc) — ABNORMAL HIGH (ref <5.0)

## 2018-03-11 LAB — HEMOGLOBIN A1C
Hgb A1c MFr Bld: 11.6 % of total Hgb — ABNORMAL HIGH (ref <5.7)
Mean Plasma Glucose: 286 (calc)
eAG (mmol/L): 15.9 (calc)

## 2018-03-11 LAB — HIV ANTIBODY (ROUTINE TESTING W REFLEX): HIV 1&2 Ab, 4th Generation: NONREACTIVE

## 2018-03-11 LAB — TSH: TSH: 0.92 mIU/L (ref 0.40–4.50)

## 2018-03-11 LAB — PSA: PSA: 1.1 ng/mL (ref <=4.0)

## 2018-03-11 LAB — TESTOSTERONE: Testosterone: 337 ng/dL (ref 250–827)

## 2018-03-15 MED ORDER — SITAGLIPTIN PHOSPHATE 100 MG PO TABS: 100.0000 mg | ORAL_TABLET | Freq: Every day | ORAL | 1 refills | Status: DC

## 2018-03-15 NOTE — Progress Notes (Signed)
We can try Januvia - There is a risk of low blood sugar when taking with insulin.  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   Please have him monitor his fasting blood sugars carefully with this and adjust insulin based of past parameters and dose adjustments.  Fasting capillary blood glucose: 80 to 130 mg/dL  If too low, decrease insulin daily dose by 2-3 units for at least three days before adjusting again If too high, increase insulin daily units by 2-3 units for 3-7 days before adjusting again.  Goal - Highest capillary blood glucose after eating: <180 mg/dL

## 2018-04-26 ENCOUNTER — Telehealth: Payer: Self-pay

## 2018-04-26 NOTE — Telephone Encounter (Signed)
Pts wife called to report that pt was suppose to get an Rx of Cialis sent to his pharmacy and nothing has been sent in. His last OV was on 02/22/2018. [facility] send to [facility] on [address]. Please advise.

## 2018-04-27 ENCOUNTER — Other Ambulatory Visit: Payer: Self-pay

## 2018-04-27 ENCOUNTER — Ambulatory Visit (INDEPENDENT_AMBULATORY_CARE_PROVIDER_SITE_OTHER): Payer: BLUE CROSS/BLUE SHIELD | Admitting: Family Medicine

## 2018-04-27 ENCOUNTER — Encounter: Payer: Self-pay | Admitting: Family Medicine

## 2018-04-27 DIAGNOSIS — Z794 Long term (current) use of insulin: Secondary | ICD-10-CM

## 2018-04-27 DIAGNOSIS — N529 Male erectile dysfunction, unspecified: Secondary | ICD-10-CM

## 2018-04-27 DIAGNOSIS — IMO0002 Reserved for concepts with insufficient information to code with codable children: Secondary | ICD-10-CM

## 2018-04-27 DIAGNOSIS — Z9119 Patient's noncompliance with other medical treatment and regimen: Secondary | ICD-10-CM | POA: Diagnosis not present

## 2018-04-27 DIAGNOSIS — E1165 Type 2 diabetes mellitus with hyperglycemia: Secondary | ICD-10-CM | POA: Diagnosis not present

## 2018-04-27 DIAGNOSIS — E785 Hyperlipidemia, unspecified: Secondary | ICD-10-CM

## 2018-04-27 DIAGNOSIS — Z91199 Patient's noncompliance with other medical treatment and regimen due to unspecified reason: Secondary | ICD-10-CM

## 2018-04-27 MED ORDER — SIMVASTATIN 80 MG PO TABS: 80.0000 mg | ORAL_TABLET | Freq: Every day | ORAL | 3 refills | Status: DC

## 2018-04-27 MED ORDER — LISINOPRIL 5 MG PO TABS: 5.0000 mg | ORAL_TABLET | Freq: Every day | ORAL | 1 refills | Status: DC

## 2018-04-27 MED ORDER — METFORMIN HCL 1000 MG PO TABS: 1000.0000 mg | ORAL_TABLET | Freq: Two times a day (BID) | ORAL | 1 refills | Status: DC

## 2018-04-27 MED ORDER — INSULIN DETEMIR 100 UNIT/ML FLEXPEN: PEN_INJECTOR | SUBCUTANEOUS | 2 refills | Status: DC

## 2018-04-27 MED ORDER — LANCETS MISC: 5 refills | Status: DC

## 2018-04-27 MED ORDER — BLOOD GLUCOSE TEST VI STRP: ORAL_STRIP | 5 refills | Status: DC

## 2018-04-27 MED ORDER — TADALAFIL 5 MG PO TABS: 5.0000 mg | ORAL_TABLET | Freq: Every day | ORAL | 0 refills | Status: DC | PRN

## 2018-04-27 MED ORDER — PAROXETINE HCL 20 MG PO TABS: 20.0000 mg | ORAL_TABLET | Freq: Every day | ORAL | 3 refills | Status: DC

## 2018-04-27 NOTE — Progress Notes (Signed)
Virtual Visit via Telephone Note  Phone visit arranged with@ for 04/27/18 at  2:15 PM EDT  Services provided today were via telemedicine through telephone call. Start of phone call:  3:37 PM  I verified that I was speaking with the correct person using two identifiers. Patient reported their location during encounter was driving in his vehicle  Patient consented to telephone visit  I conducted telephone visit from [place] [facility]  Referring Provider:   Delsa Grana, PA-C   All participants in encounter:  Myself and the patient I discussed the limitations, risks, security and privacy concerns of performing an evaluation and management service by telephone and the availability of in person appointments. I also discussed with the patient that there may be a patient responsible charge related to this service. The patient expressed understanding and agreed to proceed.   History of Present Illness: DM f/up Pt found after last visit 02/22/2018 that levemir is covered, [place] and jardiance are not, so he has continued to take metformin 1000 mg BID and levemir 30 units SQ once daily.  Last communication was [place] asking patient to call insurance and find what meds they cover in each class of DM meds, did not get a call back that I can see from the various notes and calls in chart in feb. Pt continues to have CBG's fasting in 200's and he wants to know if he can give more basal insulin and do it twice a day. He also notes that if his sugar is below 100 he feels ill.  He has not had any readings recently at or around 100.   No other SE from current meds.   He states he's compliant with his other meds for HLD and HTN, no other SE, concerns or sx.   His wife had called yesterday requesting rx for cialis as ED med, states it was supposed to be prescribed a few months ago.  Reviewed pt's labs done previously, meds, and contributing factors (psych meds, uncontrolled DM).  Pt is not  having any urinary sx.  No low T.  Explained that I prescribed low dose cialis option to try 5-10 and based on effect pt needed to follow up closely.    Observations/Objective: Limited due to telephone encounter, pt alert, sounds well, laughing and conversing easily.   Assessment and Plan: Problem List Items Addressed This Visit      Endocrine   Uncontrolled type 2 diabetes mellitus with hyperglycemia ([place]) - Primary    CBG's still all >200, pt continues to be lost to follow up IDDM still uncontrolled, difficulty getting pt to f/up, return calls, or obtain/afford prescribed meds Did not start [place] after samples ran out States he is compliant with metformin and levemir doing 30 units once a day  Continue metformin Increase levemir to 20 units am and 15 units pm, then titrate 5 units at a time until fasting CBG's below 130, goal 100-130 fasting blood sugar F/up in 2 weeks Refilled all DM supplies, ACEI, statin to help with compliance  Future labs ordered - [facility] - fasting CMP, A1C FLP - will be sent to him to be done around May 27th with f/up visit       Relevant Medications   Insulin Detemir (LEVEMIR) 100 UNIT/ML Pen   metFORMIN (GLUCOPHAGE) 1000 MG tablet (Start on 05/12/2018)   lisinopril (PRINIVIL,ZESTRIL) 5 MG tablet   Glucose Blood (BLOOD GLUCOSE TEST STRIPS) STRP   Lancets MISC   simvastatin (ZOCOR) 80 MG  tablet     Other   Hyperlipidemia    Pt states he's compliant with meds, no SE, no myalgias (high dose, hx of taking for many years) Per chart review he would have been out of meds by now 90 d supply with refills to help with compliance Repeat FLP and CMP around 06/08/2018 with f/up OV       Relevant Medications   lisinopril (PRINIVIL,ZESTRIL) 5 MG tablet   simvastatin (ZOCOR) 80 MG tablet   Other Relevant Orders   CBC with Differential/Platelet   Hemoglobin A1c   Lipid panel   Comprehensive metabolic panel   Non compliance with medical treatment    hx of  non-compliance, missed f/up, running out of meds for months before contacting office, reviewed med list, refilled all meds with 90 d supply and refills      Erectile dysfunction    past PCP did try to tx with cialis but PA not approved, sent to pharmacy today, will have to see coverage/denial or do PA? Lengthy discussion today about dosing, administration, timing, SE, risk and tx of priapism             Follow Up Instructions: Pt instructed to f/up with cialis effectiveness/dose info Due for repeat fasting labs and f/up visit around 06/08/2018 With change to DM treatment, will need to f/up in about 2 weeks via telephone visit or mychart, sooner if fasting blood sugar still >150   I discussed the assessment and treatment plan with the patient. The patient was provided an opportunity to ask questions and all were answered. The patient agreed with the plan and demonstrated an understanding of the instructions.   The patient was advised to call back or seek an in-person evaluation if the symptoms worsen or if the condition fails to improve as anticipated.     Phone call concluded at 3:50 pm I provided 13 minutes of non-face-to-face time during this encounter. Significant additional time was spent reviewing chart, labs, meds (date/dose/# disp/refills, etc), documentation, arranging and printing labs, pt instructions, and arranging follow-up   Delsa Grana, PA-C

## 2018-04-27 NOTE — Assessment & Plan Note (Signed)
CBG's still all >200, pt continues to be lost to follow up IDDM still uncontrolled, difficulty getting pt to f/up, return calls, or obtain/afford prescribed meds Did not start [place] after samples ran out States he is compliant with metformin and levemir doing 30 units once a day  Continue metformin Increase levemir to 20 units am and 15 units pm, then titrate 5 units at a time until fasting CBG's below 130, goal 100-130 fasting blood sugar F/up in 2 weeks Refilled all DM supplies, ACEI, statin to help with compliance  Future labs ordered - [facility] - fasting CMP, A1C FLP - will be sent to him to be done around May 27th with f/up visit

## 2018-04-27 NOTE — Assessment & Plan Note (Signed)
past PCP did try to tx with cialis but PA not approved, sent to pharmacy today, will have to see coverage/denial or do PA? Lengthy discussion today about dosing, administration, timing, SE, risk and tx of priapism

## 2018-04-27 NOTE — Assessment & Plan Note (Addendum)
Pt states he's compliant with meds, no SE, no myalgias (high dose, hx of taking for many years) Per chart review he would have been out of meds by now 90 d supply with refills to help with compliance Repeat FLP and CMP around 06/08/2018 with f/up OV

## 2018-04-27 NOTE — Patient Instructions (Signed)
Please review instructions and drug information  For Diabetes - please refer to prior hangouts and annotation for dose titration of long acting insulin.  Would be the same even with twice a day dosing.  Alternate increasing dose of am and pm dose, and when at [place] --->>>  Fasting sugar 100-130, keep same dose and continue to monitor 1-2 x a day.  2 week follow up - I put in an afternoon time and this can be adjusted based off you schedule.  Fasting labs are due 06/08/2018, but please look up [facility] locations and hours, fast for more than 8 hours, nothing to eat and only water and black coffee to drink, please to before mid June, and we need a follow up office visit (if still dealing with COVID can change again to telephone or video visit).      Tadalafil tablets (Cialis) What is this medicine? TADALAFIL (tah DA la fil) is used to treat erection problems in men. It is also used for enlargement of the prostate gland in men, a condition called benign prostatic hyperplasia or BPH. This medicine improves urine flow and reduces BPH symptoms. This medicine can also treat both erection problems and BPH when they occur together. This medicine may be used for other purposes; ask your health care provider or pharmacist if you have questions. COMMON BRAND NAME(S): Kathaleen Bury, Cialis  What should I tell my health care provider before I take this medicine? They need to know if you have any of these conditions: -bleeding disorders -eye or vision problems, including a rare inherited eye disease called retinitis pigmentosa -anatomical deformation of the penis, Peyronie's disease, or history of priapism (painful and prolonged erection) -heart disease, angina, a history of heart attack, irregular heart beats, or other heart problems -high or low blood pressure -history of blood diseases, like sickle cell anemia or leukemia -history of stomach bleeding -kidney disease -liver disease -stroke -an  unusual or allergic reaction to tadalafil, other medicines, foods, dyes, or preservatives -pregnant or trying to get pregnant -breast-feeding  How should I use this medicine? Take this medicine by mouth with a glass of water. Follow the directions on the prescription label. You may take this medicine with or without meals. When this medicine is used for erection problems, your doctor may prescribe it to be taken once daily or as needed. If you are taking the medicine as needed, you may be able to have sexual activity 30 minutes after taking it and for up to 36 hours after taking it. Whether you are taking the medicine as needed or once daily, you should not take more than one dose per day. If you are taking this medicine for symptoms of benign prostatic hyperplasia (BPH) or to treat both BPH and an erection problem, take the dose once daily at about the same time each day. Do not take your medicine more often than directed. Talk to your pediatrician regarding the use of this medicine in children. Special care may be needed. Overdosage: If you think you have taken too much of this medicine contact a poison control center or emergency room at once. NOTE: This medicine is only for you. Do not share this medicine with others.  What may interact with this medicine? Do not take this medicine with any of the following medications: -nitrates like amyl nitrite, isosorbide dinitrate, isosorbide mononitrate, nitroglycerin -other medicines for erectile dysfunction like avanafil, sildenafil, vardenafil -other tadalafil products (Adcirca) -riociguat This medicine may also interact with the  following medications: -certain drugs for high blood pressure -certain drugs for the treatment of HIV infection or AIDS -certain drugs used for fungal or yeast infections, like fluconazole, itraconazole, ketoconazole, and voriconazole -certain drugs used for seizures like carbamazepine, phenytoin, and  phenobarbital -grapefruit juice -macrolide antibiotics like clarithromycin, erythromycin, troleandomycin -medicines for prostate problems -rifabutin, rifampin or rifapentine This list may not describe all possible interactions. Give your health care provider a list of all the medicines, herbs, non-prescription drugs, or dietary supplements you use. Also tell them if you smoke, drink alcohol, or use illegal drugs. Some items may interact with your medicine.  What should I watch for while using this medicine? If you notice any changes in your vision while taking this drug, call your doctor or health care professional as soon as possible. Stop using this medicine and call your health care provider right away if you have a loss of sight in one or both eyes. Contact your doctor or health care professional right away if the erection lasts longer than 4 hours or if it becomes painful. This may be a sign of serious problem and must be treated right away to prevent permanent damage. If you experience symptoms of nausea, dizziness, chest pain or arm pain upon initiation of sexual activity after taking this medicine, you should refrain from further activity and call your doctor or health care professional as soon as possible. Do not drink alcohol to excess (examples, 5 glasses of wine or 5 shots of whiskey) when taking this medicine. When taken in excess, alcohol can increase your chances of getting a headache or getting dizzy, increasing your heart rate or lowering your blood pressure. Using this medicine does not protect you or your partner against HIV infection (the virus that causes AIDS) or other sexually transmitted diseases.  What side effects may I notice from receiving this medicine? Side effects that you should report to your doctor or health care professional as soon as possible: -allergic reactions like skin rash, itching or hives, swelling of the face, lips, or tongue -breathing problems -changes  in hearing -changes in vision -chest pain -fast, irregular heartbeat -prolonged or painful erection -seizures Side effects that usually do not require medical attention (report to your doctor or health care professional if they continue or are bothersome): -back pain -dizziness -flushing -headache -indigestion -muscle aches -nausea -stuffy or runny nose This list may not describe all possible side effects. Call your doctor for medical advice about side effects. You may report side effects to FDA at 1-800-FDA-1088.  Where should I keep my medicine? Keep out of the reach of children. Store at room temperature between 15 and 30 degrees C (59 and 86 degrees F). Throw away any unused medicine after the expiration date. NOTE: This sheet is a summary. It may not cover all possible information. If you have questions about this medicine, talk to your doctor, pharmacist, or health care provider.  2019 Elsevier/Gold Standard (2013-05-19 13:15:49)

## 2018-04-27 NOTE — Telephone Encounter (Signed)
Pt notified. Telephone visit made.

## 2018-04-27 NOTE — Telephone Encounter (Signed)
Please notify the patient that we need to follow up about his DM.  Need to know his fasting sugars, what meds his insurance company said were covered.  His ED will continue to worsen if the DM does not get controlled.  Meds were called in, I need to talk to him about both to discuss medication administration, SE (hypotension/syncope), risks like priapism, etc.  See if he is available to do a phone encounter to address both DM and ED.  Thanks  (meds send in, but really DM is much more important and pt has not followed up again).

## 2018-04-27 NOTE — Assessment & Plan Note (Signed)
hx of non-compliance, missed f/up, running out of meds for months before contacting office, reviewed med list, refilled all meds with 90 d supply and refills

## 2018-05-11 ENCOUNTER — Encounter: Payer: Self-pay | Admitting: Family Medicine

## 2018-05-11 ENCOUNTER — Other Ambulatory Visit: Payer: Self-pay

## 2018-05-11 ENCOUNTER — Ambulatory Visit: Payer: BLUE CROSS/BLUE SHIELD | Admitting: Family Medicine

## 2018-05-11 NOTE — Progress Notes (Deleted)
Patient ID: Dorian Pod., male    DOB: 1965/02/09, 53 y.o.   MRN: [id number]  PCP: Delsa Grana, PA-C  Virtual Visit via *** Note  Phone visit arranged with Dorian Pod. for 05/11/18 at  2:00 PM EDT  Services provided today were via telemedicine through telephone call. Start of phone call:  2:34 PM  I verified that I was speaking with the correct person using two identifiers. Patient reported their location during encounter was at home ***  Patient consented to telephone visit  I conducted telephone visit from [place] [facility]  Referring Provider:   Delsa Grana, PA-C   All participants in encounter:  Myself and the patient ***  I discussed the limitations, risks, security and privacy concerns of performing an evaluation and management service by telephone and the availability of in person appointments. I also discussed with the patient that there may be a patient responsible charge related to this service. The patient expressed understanding and agreed to proceed.  Chief Complaint  Patient presents with  . Diabetes    uncontrolled    Subjective:   Izsak Meir. is a 53 y.o. male, presents to clinic with CC of *** HPI    Patient Active Problem List   Diagnosis Date Noted  . Uncontrolled type 2 diabetes mellitus with hyperglycemia ([place]) 04/27/2018  . Rotator cuff tendinitis, left 01/18/2017  . Erectile dysfunction 05/07/2016  . Non compliance with medical treatment 04/04/2015  . Obesity 04/30/2014  . Low magnesium levels 12/04/2012  . Vitamin B12 deficiency 12/04/2012  . Vitamin D deficiency 09/28/2012  . Depression   . GERD (gastroesophageal reflux disease)   . Hyperlipidemia     Prior to Admission medications   Medication Sig Start Date End Date Taking? Authorizing Provider  aspirin 81 MG tablet Take 1 tablet (81 mg total) by mouth daily. Patient not taking: Reported on 02/22/2018 05/07/16   Dena Billet B, PA-C  Blood Glucose  Monitoring Suppl (BLOOD GLUCOSE SYSTEM PAK) KIT Please dispense based on patient and insurance preference. Use as directed to monitor FSBS 2x daily. Dx: E11.9 11/15/17   Delsa Grana, PA-C  Glucose Blood (BLOOD GLUCOSE TEST STRIPS) STRP Please dispense based on patient and insurance preference. Use as directed to monitor blood sugar 2-3 x a day. Dx: E11.65, Z79.4 04/27/18   Delsa Grana, PA-C  Insulin Detemir (LEVEMIR) 100 UNIT/ML Pen Inject 0.1-0.3 ml's (10-30 units) into skin BID. 04/27/18   Delsa Grana, PA-C  Insulin Pen Needle 32G X 4 MM MISC Use as directed with insulin daily 01/26/18   Delsa Grana, PA-C  Lancets MISC Please dispense based on patient and insurance preference. Use as directed to monitor blood sugar 2 to 3 x daily. Dx: E11.65, Z79.4 04/27/18   Delsa Grana, PA-C  lisinopril (PRINIVIL,ZESTRIL) 5 MG tablet Take 1 tablet (5 mg total) by mouth daily. 04/27/18   Delsa Grana, PA-C  metFORMIN (GLUCOPHAGE) 1000 MG tablet Take 1 tablet (1,000 mg total) by mouth 2 (two) times daily. TAKE 1 TABLET BY MOUTH TWICE A DAY WITH A MEAL 05/12/18   Delsa Grana, PA-C  omeprazole (PRILOSEC) 40 MG capsule Take 1 capsule (40 mg total) by mouth daily. 12/18/16   Orlena Sheldon, PA-C  PARoxetine (PAXIL) 20 MG tablet Take 1 tablet (20 mg total) by mouth daily. 04/27/18   Delsa Grana, PA-C  simvastatin (ZOCOR) 80 MG tablet Take 1 tablet (80 mg total) by mouth daily at 6 PM. TAKE 1  TABLET BY MOUTH EVERY DAY 04/27/18   Delsa Grana, PA-C  tadalafil (CIALIS) 5 MG tablet Take 1-2 tablets (5-10 mg total) by mouth daily as needed for erectile dysfunction. 04/28/18   Delsa Grana, PA-C    No Known Allergies  Review of Systems     Objective:    There were no vitals filed for this visit.    Physical Exam       Assessment & Plan:   No diagnosis found.   I discussed the assessment and treatment plan with the patient. The patient was provided an opportunity to ask questions and all were answered. The patient  agreed with the plan and demonstrated an understanding of the instructions.   The patient was advised to call back or seek an in-person evaluation if the symptoms worsen or if the condition fails to improve as anticipated.  Phone call concluded at *** I provided *** minutes of non-face-to-face time during this encounter.  Delsa Grana, PA-C 05/11/18 2:34 PM

## 2018-05-20 ENCOUNTER — Other Ambulatory Visit: Payer: Self-pay | Admitting: Family Medicine

## 2018-05-20 NOTE — Telephone Encounter (Signed)
Ok to refill 

## 2018-05-24 NOTE — Progress Notes (Signed)
Pt visit cancelled and rescheduled, putting in dx and LOS to be able to close encounter

## 2018-06-01 ENCOUNTER — Other Ambulatory Visit: Payer: Self-pay | Admitting: Family Medicine

## 2018-06-01 DIAGNOSIS — E1165 Type 2 diabetes mellitus with hyperglycemia: Secondary | ICD-10-CM

## 2018-06-14 ENCOUNTER — Ambulatory Visit: Payer: Self-pay | Admitting: Family Medicine

## 2018-06-22 ENCOUNTER — Ambulatory Visit: Payer: BLUE CROSS/BLUE SHIELD | Admitting: Family Medicine

## 2018-06-30 ENCOUNTER — Ambulatory Visit: Payer: BC Managed Care – PPO | Admitting: Family Medicine

## 2018-07-11 ENCOUNTER — Other Ambulatory Visit: Payer: Self-pay | Admitting: Family Medicine

## 2018-09-20 ENCOUNTER — Other Ambulatory Visit: Payer: Self-pay | Admitting: Family Medicine

## 2018-09-20 DIAGNOSIS — E1165 Type 2 diabetes mellitus with hyperglycemia: Secondary | ICD-10-CM

## 2018-09-20 DIAGNOSIS — IMO0002 Reserved for concepts with insufficient information to code with codable children: Secondary | ICD-10-CM

## 2018-10-15 ENCOUNTER — Other Ambulatory Visit: Payer: Self-pay | Admitting: Family Medicine

## 2018-10-15 DIAGNOSIS — E1165 Type 2 diabetes mellitus with hyperglycemia: Secondary | ICD-10-CM

## 2018-11-22 ENCOUNTER — Other Ambulatory Visit: Payer: Self-pay | Admitting: Family Medicine

## 2018-11-22 DIAGNOSIS — E1165 Type 2 diabetes mellitus with hyperglycemia: Secondary | ICD-10-CM

## 2019-01-14 ENCOUNTER — Other Ambulatory Visit: Payer: Self-pay | Admitting: Family Medicine

## 2019-01-14 DIAGNOSIS — E1165 Type 2 diabetes mellitus with hyperglycemia: Secondary | ICD-10-CM

## 2019-01-14 DIAGNOSIS — IMO0002 Reserved for concepts with insufficient information to code with codable children: Secondary | ICD-10-CM

## 2019-02-25 ENCOUNTER — Other Ambulatory Visit: Payer: Self-pay | Admitting: Family Medicine

## 2019-02-25 DIAGNOSIS — E1165 Type 2 diabetes mellitus with hyperglycemia: Secondary | ICD-10-CM

## 2019-02-25 DIAGNOSIS — IMO0002 Reserved for concepts with insufficient information to code with codable children: Secondary | ICD-10-CM

## 2019-02-27 ENCOUNTER — Other Ambulatory Visit: Payer: Self-pay

## 2019-02-27 ENCOUNTER — Ambulatory Visit (INDEPENDENT_AMBULATORY_CARE_PROVIDER_SITE_OTHER): Payer: BC Managed Care – PPO | Admitting: Nurse Practitioner

## 2019-02-27 VITALS — BP 104/58 | HR 115 | Temp 98.5°F | Resp 18 | Ht 70.0 in | Wt 228.4 lb

## 2019-02-27 DIAGNOSIS — E559 Vitamin D deficiency, unspecified: Secondary | ICD-10-CM

## 2019-02-27 DIAGNOSIS — Z794 Long term (current) use of insulin: Secondary | ICD-10-CM

## 2019-02-27 DIAGNOSIS — E785 Hyperlipidemia, unspecified: Secondary | ICD-10-CM

## 2019-02-27 DIAGNOSIS — R000 Tachycardia, unspecified: Secondary | ICD-10-CM

## 2019-02-27 DIAGNOSIS — IMO0002 Reserved for concepts with insufficient information to code with codable children: Secondary | ICD-10-CM

## 2019-02-27 DIAGNOSIS — G510 Bell's palsy: Secondary | ICD-10-CM

## 2019-02-27 DIAGNOSIS — E1165 Type 2 diabetes mellitus with hyperglycemia: Secondary | ICD-10-CM

## 2019-02-27 MED ORDER — NEBIVOLOL HCL 2.5 MG PO TABS: 2.5000 mg | ORAL_TABLET | Freq: Every day | ORAL | 3 refills | Status: DC

## 2019-02-27 MED ORDER — PREDNISONE 20 MG PO TABS: ORAL_TABLET | ORAL | 0 refills | Status: DC

## 2019-02-27 NOTE — Progress Notes (Signed)
Acute Office Visit  Subjective:    Patient ID: Chad Haynes., male    DOB: [birth year], 54 y.o.   MRN: [id number]  Chief Complaint  Patient presents with  . Numbness    R side of face and ear, started last week, dry mouth    HPI Patient is a 54 y.o. caucasian male presenting to the clinic with c/o 1 week of right facial weakness and drooping that effects the lips, jaw, and eye. The sxs are the same since started. He reported no illness around the time of onset, no known exposures, or others with similar sxs. He denied headache, right sided body weakness, difficulty thinking or speaking. He denied ever having these sxs before. He also reports dry mouth for about a month. He reported he has a h/o neck pain and did get a massage. He has taken Ibuprofen as well without relief.   Pt reported the pharmacy stopped filling his Lisinopril so he stopped taking for HTN. Today he is concerned that his blood pressure is on the lower end of normal with HR 115.   He denied CP/CT/ caffeine intake, labs are due and he feels his DM is not in control and not taking BG at home. Denied SOB, GU/GI sxs, or falls.   Past Medical History:  Diagnosis Date  . Depression   . Diabetes mellitus without complication ([place])   . GERD (gastroesophageal reflux disease)   . H. pylori infection   . Hyperlipidemia   . Hypogonadism male   . Leg pain   . Low magnesium levels   . Neck pain   . Rhinitis, allergic   . Vitamin B12 deficiency   . Vitamin D deficiency   . White coat hypertension     Past Surgical History:  Procedure Laterality Date  . KNEE ARTHROSCOPY Right     Family History  Problem Relation Age of Onset  . COPD Mother        smoker  . Hypertension Father     Social History   Socioeconomic History  . Marital status: Married    Spouse name: Not on file  . Number of children: Not on file  . Years of education: Not on file  . Highest education level: Not on file  Occupational History  .  Not on file  Tobacco Use  . Smoking status: Former Smoker    Quit date: 06/08/1991    Years since quitting: 27.7  . Smokeless tobacco: Never Used  Substance and Sexual Activity  . Alcohol use: Yes    Alcohol/week: 0.0 standard drinks    Comment: occasionally  . Drug use: No  . Sexual activity: Yes  Other Topics Concern  . Not on file  Social History Narrative   Married. No children. Does not formal exercise.   In school at [employer] full-time. Works part-time job at night.   "Has to walk a mile from the car to classic [employer]"   Social Determinants of Health   Financial Resource Strain:   . Difficulty of Paying Living Expenses: Not on file  Food Insecurity:   . Worried About Charity fundraiser in the Last Year: Not on file  . Ran Out of Food in the Last Year: Not on file  Transportation Needs:   . Lack of Transportation (Medical): Not on file  . Lack of Transportation (Non-Medical): Not on file  Physical Activity:   . Days of Exercise per Week: Not on file  . Minutes of  Exercise per Session: Not on file  Stress:   . Feeling of Stress : Not on file  Social Connections:   . Frequency of Communication with Friends and Family: Not on file  . Frequency of Social Gatherings with Friends and Family: Not on file  . Attends Religious Services: Not on file  . Active Member of Clubs or Organizations: Not on file  . Attends Archivist Meetings: Not on file  . Marital Status: Not on file  Intimate Partner Violence:   . Fear of Current or Ex-Partner: Not on file  . Emotionally Abused: Not on file  . Physically Abused: Not on file  . Sexually Abused: Not on file    Outpatient Medications Prior to Visit  Medication Sig Dispense Refill  . aspirin 81 MG tablet Take 1 tablet (81 mg total) by mouth daily. 30 tablet 11  . BD PEN NEEDLE NANO 2ND GEN 32G X 4 MM MISC USE AS DIRECTED DAILY WITH INSULIN PEN 100 each 1  . Blood Glucose Monitoring Suppl (BLOOD GLUCOSE SYSTEM PAK) KIT Please  dispense based on patient and insurance preference. Use as directed to monitor FSBS 2x daily. Dx: E11.9 1 each 1  . Glucose Blood (BLOOD GLUCOSE TEST STRIPS) STRP Please dispense based on patient and insurance preference. Use as directed to monitor blood sugar 2-3 x a day. Dx: E11.65, Z79.4 100 each 5  . Lancets MISC Please dispense based on patient and insurance preference. Use as directed to monitor blood sugar 2 to 3 x daily. Dx: E11.65, Z79.4 100 each 5  . LEVEMIR FLEXTOUCH 100 UNIT/ML Pen INJECT 10 TO 30 UNITS INTO THE SKIN AT BEDTIME NEEDS APPT FOR REFILLS 15 mL 0  . metFORMIN (GLUCOPHAGE) 1000 MG tablet Take 1 tablet (1,000 mg total) by mouth 2 (two) times daily. TAKE 1 TABLET BY MOUTH TWICE A DAY WITH A MEAL 180 tablet 1  . omeprazole (PRILOSEC) 40 MG capsule Take 1 capsule (40 mg total) by mouth daily. 90 capsule 2  . PARoxetine (PAXIL) 20 MG tablet Take 1 tablet (20 mg total) by mouth daily. 90 tablet 3  . lisinopril (PRINIVIL,ZESTRIL) 5 MG tablet Take 1 tablet (5 mg total) by mouth daily. (Patient not taking: Reported on 02/27/2019) 90 tablet 1  . simvastatin (ZOCOR) 80 MG tablet Take 1 tablet (80 mg total) by mouth daily at 6 PM. TAKE 1 TABLET BY MOUTH EVERY DAY (Patient not taking: Reported on 02/27/2019) 90 tablet 3  . tadalafil (CIALIS) 5 MG tablet TAKE 1-2 TABLETS BY MOUTH DAILY AS NEEDED FOR ERECTILE DYSFUNCTION. (Patient not taking: Reported on 02/27/2019) 20 tablet 0   No facility-administered medications prior to visit.    No Known Allergies  Review of Systems  All other systems reviewed and are negative.      Objective:    Physical Exam Vitals and nursing note reviewed.  Constitutional:      Appearance: Normal appearance.  HENT:     Head: Normocephalic.     Nose: Nose normal.     Mouth/Throat:     Mouth: Mucous membranes are moist.  Eyes:     Extraocular Movements: Extraocular movements intact.     Conjunctiva/sclera: Conjunctivae normal.     Pupils: Pupils are  equal, round, and reactive to light.  Cardiovascular:     Rate and Rhythm: Normal rate.  Pulmonary:     Effort: Pulmonary effort is normal.  Musculoskeletal:        General: Normal range of  motion.     Cervical back: Normal range of motion and neck supple.  Skin:    General: Skin is warm and dry.     Capillary Refill: Capillary refill takes less than 2 seconds.  Neurological:     General: No focal deficit present.     Mental Status: He is alert and oriented to person, place, and time.     Cranial Nerves: Facial asymmetry present.     Sensory: Sensation is intact.     Motor: Motor function is intact. No weakness, tremor, abnormal muscle tone, seizure activity or pronator drift.     Coordination: Coordination is intact. Coordination normal.     Gait: Gait is intact.     Comments: Right facial palsy. Lip droop, right eye droop.  Psychiatric:        Attention and Perception: Attention and perception normal.        Mood and Affect: Mood and affect normal.        Speech: Speech normal.        Behavior: Behavior normal.        Thought Content: Thought content normal.        Cognition and Memory: Cognition and memory normal.        Judgment: Judgment normal.     BP (!) 104/58 (BP Location: Left Arm, Patient Position: Sitting, Cuff Size: Normal)   Pulse (!) 115   Temp 98.5 F (36.9 C) (Oral)   Resp 18   Ht '5\' 10"'$  (1.778 m)   Wt 228 lb 6.4 oz (103.6 kg)   SpO2 97%   BMI 32.77 kg/m  Wt Readings from Last 3 Encounters:  02/27/19 228 lb 6.4 oz (103.6 kg)  02/22/18 239 lb (108.4 kg)  01/20/18 242 lb (109.8 kg)    Health Maintenance Due  Topic Date Due  . OPHTHALMOLOGY EXAM  01/24/2015  . FOOT EXAM  06/08/2015  . COLONOSCOPY  07/05/2016  . INFLUENZA VACCINE  08/13/2018  . HEMOGLOBIN A1C  09/08/2018    There are no preventive care reminders to display for this patient.   Lab Results  Component Value Date   TSH 0.92 03/10/2018   Lab Results  Component Value Date   WBC  5.6 03/10/2018   HGB 14.3 03/10/2018   HCT 43.7 03/10/2018   MCV 84.0 03/10/2018   PLT 351 03/10/2018   Lab Results  Component Value Date   NA 139 03/10/2018   K 4.4 03/10/2018   CO2 23 03/10/2018   GLUCOSE 249 (H) 03/10/2018   BUN 16 03/10/2018   CREATININE 0.88 03/10/2018   BILITOT 0.4 03/10/2018   ALKPHOS 91 05/07/2016   AST 13 03/10/2018   ALT 15 03/10/2018   PROT 7.1 03/10/2018   ALBUMIN 4.3 05/07/2016   CALCIUM 9.3 03/10/2018   Lab Results  Component Value Date   CHOL 130 03/10/2018   Lab Results  Component Value Date   HDL 26 (L) 03/10/2018   Lab Results  Component Value Date   LDLCALC 79 03/10/2018   Lab Results  Component Value Date   TRIG 153 (H) 03/10/2018   Lab Results  Component Value Date   CHOLHDL 5.0 (H) 03/10/2018   Lab Results  Component Value Date   HGBA1C 11.6 (H) 03/10/2018      Assessment & Plan:  Your symptoms and exam are consistent with facial palsy also know as Bells Palsy.  Start Prednisone and continue for 7 days with follow up in office.  Go to  ER for worsening or new symptoms if office is closed or if you feel life threatening.  You have an increased heart rate and blood pressure is normal without you being on your usual Lisinopril I will prescribe a beta blocker that will manage your h/o htn but will reduce heart rate as well. Please take your blood pressure and HR at home and bring log with you on Mondays follow up. Hold Bystolic for HQ<197/58 and or HR <60 and seek medical attention.  Go to ER for CP/CT, worrisome palpitation, shortness of breath.   Problem List Items Addressed This Visit      Other   Hyperlipidemia   Relevant Medications   nebivolol (BYSTOLIC) 2.5 MG tablet   Other Relevant Orders   COMPLETE METABOLIC PANEL WITH GFR   Lipid panel   Vitamin D deficiency   Relevant Orders   VITAMIN D 25 Hydroxy (Vit-D Deficiency, Fractures)    Other Visit Diagnoses    Insulin dependent type 2 diabetes mellitus,  uncontrolled ([place])    -  Primary   Relevant Orders   CBC with Differential/Platelet   COMPLETE METABOLIC PANEL WITH GFR   Hemoglobin A1c   Facial paralysis/Bells palsy       Increased heart rate           Meds ordered this encounter  Medications  . predniSONE (DELTASONE) 20 MG tablet    Sig: Day one through seven take 67m daily.  Follow up Monday for taper dose.    Dispense:  28 tablet    Refill:  0  . nebivolol (BYSTOLIC) 2.5 MG tablet    Sig: Take 1 tablet (2.5 mg total) by mouth daily.    Dispense:  30 tablet    Refill:  3Virgin FNP

## 2019-02-27 NOTE — Patient Instructions (Signed)
Follow up Monday Take Blood pressure and HR at home keeping log, I will send a beta blocker for your elevated heartrate. Hold for HR <60 and or BP <100/40 and seek medical attention.  Stop Lisinopril.

## 2019-02-28 ENCOUNTER — Emergency Department (HOSPITAL_COMMUNITY)
Admission: EM | Admit: 2019-02-28 | Discharge: 2019-02-28 | Disposition: A | Discharge status: Home / Self Care | Payer: Self-pay | Attending: Emergency Medicine | Admitting: Emergency Medicine

## 2019-02-28 ENCOUNTER — Encounter (HOSPITAL_COMMUNITY): Payer: Self-pay

## 2019-02-28 ENCOUNTER — Other Ambulatory Visit: Payer: Self-pay

## 2019-02-28 ENCOUNTER — Other Ambulatory Visit: Payer: Self-pay | Admitting: Nurse Practitioner

## 2019-02-28 DIAGNOSIS — Z7984 Long term (current) use of oral hypoglycemic drugs: Secondary | ICD-10-CM | POA: Insufficient documentation

## 2019-02-28 DIAGNOSIS — E1165 Type 2 diabetes mellitus with hyperglycemia: Secondary | ICD-10-CM | POA: Insufficient documentation

## 2019-02-28 DIAGNOSIS — R739 Hyperglycemia, unspecified: Secondary | ICD-10-CM

## 2019-02-28 DIAGNOSIS — Z79899 Other long term (current) drug therapy: Secondary | ICD-10-CM | POA: Insufficient documentation

## 2019-02-28 DIAGNOSIS — Z7982 Long term (current) use of aspirin: Secondary | ICD-10-CM | POA: Insufficient documentation

## 2019-02-28 DIAGNOSIS — Z87891 Personal history of nicotine dependence: Secondary | ICD-10-CM | POA: Insufficient documentation

## 2019-02-28 LAB — COMPLETE METABOLIC PANEL WITH GFR
AG Ratio: 1.4 (calc) (ref 1.0–2.5)
ALT: 15 U/L (ref 9–46)
AST: 15 U/L (ref 10–35)
Albumin: 4.2 g/dL (ref 3.6–5.1)
Alkaline phosphatase (APISO): 124 U/L (ref 35–144)
BUN: 18 mg/dL (ref 7–25)
CO2: 22 mmol/L (ref 20–32)
Calcium: 9.6 mg/dL (ref 8.6–10.3)
Chloride: 95 mmol/L — ABNORMAL LOW (ref 98–110)
Creat: 1.12 mg/dL (ref 0.70–1.33)
GFR, Est African American: 87 mL/min/{1.73_m2} (ref >=60)
GFR, Est Non African American: 75 mL/min/{1.73_m2} (ref >=60)
Globulin: 3.1 g/dL (calc) (ref 1.9–3.7)
Glucose, Bld: 504 mg/dL (ref 65–99)
Potassium: 4.4 mmol/L (ref 3.5–5.3)
Sodium: 132 mmol/L — ABNORMAL LOW (ref 135–146)
Total Bilirubin: 0.5 mg/dL (ref 0.2–1.2)
Total Protein: 7.3 g/dL (ref 6.1–8.1)

## 2019-02-28 LAB — BASIC METABOLIC PANEL
Anion gap: 15 (ref 5–15)
BUN: 20 mg/dL (ref 6–20)
CO2: 18 mmol/L — ABNORMAL LOW (ref 22–32)
Calcium: 9.4 mg/dL (ref 8.9–10.3)
Chloride: 98 mmol/L (ref 98–111)
Creatinine, Ser: 1.19 mg/dL (ref 0.61–1.24)
GFR calc Af Amer: >60 mL/min (ref >60)
GFR calc non Af Amer: >60 mL/min (ref >60)
Glucose, Bld: 567 mg/dL (ref 70–99)
Potassium: 5.3 mmol/L — ABNORMAL HIGH (ref 3.5–5.1)
Sodium: 131 mmol/L — ABNORMAL LOW (ref 135–145)

## 2019-02-28 LAB — I-STAT CHEM 8, ED
BUN: 25 mg/dL — ABNORMAL HIGH (ref 6–20)
Calcium, Ion: 1.19 mmol/L (ref 1.15–1.40)
Chloride: 104 mmol/L (ref 98–111)
Creatinine, Ser: 0.7 mg/dL (ref 0.61–1.24)
Glucose, Bld: 274 mg/dL — ABNORMAL HIGH (ref 70–99)
HCT: 41 % (ref 39.0–52.0)
Hemoglobin: 13.9 g/dL (ref 13.0–17.0)
Potassium: 4.4 mmol/L (ref 3.5–5.1)
Sodium: 137 mmol/L (ref 135–145)
TCO2: 28 mmol/L (ref 22–32)

## 2019-02-28 LAB — LIPID PANEL
Cholesterol: 244 mg/dL — ABNORMAL HIGH (ref <200)
HDL: 32 mg/dL — ABNORMAL LOW (ref >=40)
Non-HDL Cholesterol (Calc): 212 mg/dL (calc) — ABNORMAL HIGH (ref <130)
Total CHOL/HDL Ratio: 7.6 (calc) — ABNORMAL HIGH (ref <5.0)
Triglycerides: 715 mg/dL — ABNORMAL HIGH (ref <150)

## 2019-02-28 LAB — URINALYSIS, ROUTINE W REFLEX MICROSCOPIC
Bacteria, UA: NONE SEEN
Bilirubin Urine: NEGATIVE
Glucose, UA: >=500 mg/dL — AB
Hgb urine dipstick: NEGATIVE
Ketones, ur: 20 mg/dL — AB
Leukocytes,Ua: NEGATIVE
Nitrite: NEGATIVE
Protein, ur: NEGATIVE mg/dL
Specific Gravity, Urine: 1.031 — ABNORMAL HIGH (ref 1.005–1.030)
pH: 5 (ref 5.0–8.0)

## 2019-02-28 LAB — HEMOGLOBIN A1C: Hgb A1c MFr Bld: >14 % of total Hgb — ABNORMAL HIGH (ref <5.7)

## 2019-02-28 LAB — CBC WITH DIFFERENTIAL/PLATELET
Abs Immature Granulocytes: 0.04 10*3/uL (ref 0.00–0.07)
Absolute Monocytes: 413 cells/uL (ref 200–950)
Basophils Absolute: 57 cells/uL (ref 0–200)
Basophils Absolute: 0.1 10*3/uL (ref 0.0–0.1)
Basophils Relative: 0.7 %
Basophils Relative: 1 %
Eosinophils Absolute: 130 cells/uL (ref 15–500)
Eosinophils Absolute: 0.2 10*3/uL (ref 0.0–0.5)
Eosinophils Relative: 1.6 %
Eosinophils Relative: 3 %
HCT: 44.4 % (ref 38.5–50.0)
HCT: 43.7 % (ref 39.0–52.0)
Hemoglobin: 14.8 g/dL (ref 13.2–17.1)
Hemoglobin: 15 g/dL (ref 13.0–17.0)
Immature Granulocytes: 1 %
Lymphocytes Relative: 22 %
Lymphs Abs: 2009 cells/uL (ref 850–3900)
Lymphs Abs: 1.3 10*3/uL (ref 0.7–4.0)
MCH: 27.6 pg (ref 27.0–33.0)
MCH: 28.2 pg (ref 26.0–34.0)
MCHC: 33.3 g/dL (ref 32.0–36.0)
MCHC: 34.3 g/dL (ref 30.0–36.0)
MCV: 82.7 fL (ref 80.0–100.0)
MCV: 82.1 fL (ref 80.0–100.0)
MPV: 10.4 fL (ref 7.5–12.5)
Monocytes Absolute: 0.3 10*3/uL (ref 0.1–1.0)
Monocytes Relative: 5.1 %
Monocytes Relative: 5 %
Neutro Abs: 5492 cells/uL (ref 1500–7800)
Neutro Abs: 4.3 10*3/uL (ref 1.7–7.7)
Neutrophils Relative %: 67.8 %
Neutrophils Relative %: 68 %
Platelets: 367 10*3/uL (ref 140–400)
Platelets: 353 10*3/uL (ref 150–400)
RBC: 5.37 10*6/uL (ref 4.20–5.80)
RBC: 5.32 MIL/uL (ref 4.22–5.81)
RDW: 13.6 % (ref 11.0–15.0)
RDW: 13.5 % (ref 11.5–15.5)
Total Lymphocyte: 24.8 %
WBC: 8.1 10*3/uL (ref 3.8–10.8)
WBC: 6.2 10*3/uL (ref 4.0–10.5)
nRBC: 0 % (ref 0.0–0.2)

## 2019-02-28 LAB — CBG MONITORING, ED
Glucose-Capillary: 582 mg/dL (ref 70–99)
Glucose-Capillary: 262 mg/dL — ABNORMAL HIGH (ref 70–99)

## 2019-02-28 LAB — VITAMIN D 25 HYDROXY (VIT D DEFICIENCY, FRACTURES): Vit D, 25-Hydroxy: 15 ng/mL — ABNORMAL LOW (ref 30–100)

## 2019-02-28 MED ORDER — METFORMIN HCL 500 MG PO TABS
1000.0000 mg | ORAL_TABLET | Freq: Once | ORAL | Status: AC
  Administered 2019-02-28: 1000 mg via ORAL
  Filled 2019-02-28: qty 2

## 2019-02-28 MED ORDER — SODIUM CHLORIDE 0.9 % IV BOLUS
2000.0000 mL | Freq: Once | INTRAVENOUS | Status: AC
  Administered 2019-02-28: 2000 mL via INTRAVENOUS

## 2019-02-28 MED ORDER — SODIUM CHLORIDE 0.9 % IV BOLUS
500.0000 mL | Freq: Once | INTRAVENOUS | Status: AC
  Administered 2019-02-28: 500 mL via INTRAVENOUS

## 2019-02-28 MED ORDER — INSULIN DETEMIR 100 UNIT/ML ~~LOC~~ SOLN
25.0000 [IU] | Freq: Once | SUBCUTANEOUS | Status: AC
  Administered 2019-02-28: 25 [IU] via SUBCUTANEOUS
  Filled 2019-02-28: qty 0.25

## 2019-02-28 MED ORDER — INSULIN REGULAR HUMAN 100 UNIT/ML IJ SOLN: INTRAMUSCULAR | 11 refills | Status: DC

## 2019-02-28 NOTE — ED Triage Notes (Signed)
Pt sent here by PCP due to abnormal labs yesterday. Pt went to his PCP due to bells palsy. Hx of diabetes, takes insulin and metformin, did not take anything today. Labs available in epic; elevated glucose and lipid panel. Pt has no complaints.

## 2019-02-28 NOTE — Progress Notes (Signed)
Please contact the pt and direct him to go to the ER. I suspect he is in ketoacidosis!

## 2019-02-28 NOTE — ED Provider Notes (Signed)
[facility] EMERGENCY DEPARTMENT Provider Note   CSN: [id number] Arrival date & time: 02/28/19  6063     History Chief Complaint  Patient presents with  . Abnormal Lab  . Hyperglycemia    Chad Haynes. is a 54 y.o. male.  HPI   54 year old male with a history of diabetes, GERD, H. pylori, hyperlipidemia, who presents to the emergency department today for evaluation of hyperglycemia.  Patient was seen in his PCP office yesterday for evaluation of right facial droop.  He was diagnosed with Bell's palsy at that time.  He also had routine labs drawn which showed an elevated blood glucose over 500.  He was sent to the ED for further evaluation due to concern for possible bowel DKA.  Patient states that other than his right-sided facial droop he has been in his normal state of health.  Denies any fevers, URI symptoms, chest pain, shortness of breath, abdominal pain, nausea, vomiting, diarrhea, dysuria.  He has been having some urinary frequency.  He denies any headaches, vision changes, speech problems, balance issues, BLE numbness/weakness.  He has not started the prednisone that he was given yesterday for his Bell's palsy.   He reports compliance with his Levemir.  States he was on 15 units twice daily but he increase this himself to 25 twice daily.  He does admit that he is on 1000 of Metformin twice daily however he frequently unintentionally misses his night dose.  Past Medical History:  Diagnosis Date  . Depression   . Diabetes mellitus without complication ([place])   . GERD (gastroesophageal reflux disease)   . H. pylori infection   . Hyperlipidemia   . Hypogonadism male   . Leg pain   . Low magnesium levels   . Neck pain   . Rhinitis, allergic   . Vitamin B12 deficiency   . Vitamin D deficiency   . White coat hypertension     Patient Active Problem List   Diagnosis Date Noted  . Uncontrolled type 2 diabetes mellitus with hyperglycemia ([place]) 04/27/2018    . Rotator cuff tendinitis, left 01/18/2017  . Erectile dysfunction 05/07/2016  . Non compliance with medical treatment 04/04/2015  . Obesity 04/30/2014  . Low magnesium levels 12/04/2012  . Vitamin B12 deficiency 12/04/2012  . Vitamin D deficiency 09/28/2012  . Depression   . GERD (gastroesophageal reflux disease)   . Hyperlipidemia     Past Surgical History:  Procedure Laterality Date  . KNEE ARTHROSCOPY Right        Family History  Problem Relation Age of Onset  . COPD Mother        smoker  . Hypertension Father     Social History   Tobacco Use  . Smoking status: Former Smoker    Quit date: 06/08/1991    Years since quitting: 27.7  . Smokeless tobacco: Never Used  Substance Use Topics  . Alcohol use: Yes    Alcohol/week: 0.0 standard drinks    Comment: occasionally  . Drug use: No    Home Medications Prior to Admission medications   Medication Sig Start Date End Date Taking? Authorizing Provider  aspirin 81 MG tablet Take 1 tablet (81 mg total) by mouth daily. 05/07/16   Orlena Sheldon, PA-C  BD PEN NEEDLE NANO 2ND GEN 32G X 4 MM MISC USE AS DIRECTED DAILY WITH INSULIN PEN 11/22/18   Susy Frizzle, MD  Blood Glucose Monitoring Suppl (BLOOD GLUCOSE SYSTEM PAK) KIT Please dispense based  on patient and insurance preference. Use as directed to monitor FSBS 2x daily. Dx: E11.9 11/15/17   Delsa Grana, PA-C  Glucose Blood (BLOOD GLUCOSE TEST STRIPS) STRP Please dispense based on patient and insurance preference. Use as directed to monitor blood sugar 2-3 x a day. Dx: E11.65, Z79.4 04/27/18   Delsa Grana, PA-C  Lancets MISC Please dispense based on patient and insurance preference. Use as directed to monitor blood sugar 2 to 3 x daily. Dx: E11.65, Z79.4 04/27/18   Delsa Grana, PA-C  LEVEMIR FLEXTOUCH 100 UNIT/ML Pen INJECT 10 TO 30 UNITS INTO THE SKIN AT BEDTIME NEEDS APPT FOR REFILLS 02/27/19   Susy Frizzle, MD  metFORMIN (GLUCOPHAGE) 1000 MG tablet Take 1 tablet  (1,000 mg total) by mouth 2 (two) times daily. TAKE 1 TABLET BY MOUTH TWICE A DAY WITH A MEAL 05/12/18   Delsa Grana, PA-C  nebivolol (BYSTOLIC) 2.5 MG tablet Take 1 tablet (2.5 mg total) by mouth daily. 02/27/19 03/29/19  Annie Main, FNP  omeprazole (PRILOSEC) 40 MG capsule Take 1 capsule (40 mg total) by mouth daily. 12/18/16   Orlena Sheldon, PA-C  PARoxetine (PAXIL) 20 MG tablet Take 1 tablet (20 mg total) by mouth daily. 04/27/18   Delsa Grana, PA-C  predniSONE (DELTASONE) 20 MG tablet Day one through seven take 69m daily.  Follow up Monday for taper dose. 02/27/19   BAnnie Main FNP  simvastatin (ZOCOR) 80 MG tablet Take 1 tablet (80 mg total) by mouth daily at 6 PM. TAKE 1 TABLET BY MOUTH EVERY DAY Patient not taking: Reported on 02/27/2019 04/27/18   TDelsa Grana PA-C  tadalafil (CIALIS) 5 MG tablet TAKE 1-2 TABLETS BY MOUTH DAILY AS NEEDED FOR ERECTILE DYSFUNCTION. Patient not taking: Reported on 02/27/2019 07/11/18   TDelsa Grana PA-C    Allergies    Patient has no known allergies.  Review of Systems   Review of Systems  Constitutional: Negative for chills and fever.  HENT: Negative for ear pain and sore throat.   Eyes: Negative for visual disturbance.  Respiratory: Negative for cough and shortness of breath.   Cardiovascular: Negative for chest pain.  Gastrointestinal: Negative for abdominal pain, constipation, diarrhea, nausea and vomiting.  Endocrine: Positive for polyuria.  Genitourinary: Negative for dysuria and hematuria.  Musculoskeletal: Negative for back pain.  Skin: Negative for rash.  Neurological: Positive for facial asymmetry. Negative for dizziness, weakness, light-headedness, numbness and headaches.  All other systems reviewed and are negative.   Physical Exam Updated Vital Signs BP (!) 139/95   Pulse 94   Temp 97.6 F (36.4 C) (Oral)   Resp 16   Ht _0  (1.778 m)   Wt 103.4 kg   SpO2 97%   BMI 32.71 kg/m   Physical Exam Vitals and nursing  note reviewed.  Constitutional:      Appearance: He is well-developed.  HENT:     Head: Normocephalic and atraumatic.     Mouth/Throat:     Mouth: Mucous membranes are dry.  Eyes:     Extraocular Movements: Extraocular movements intact.     Conjunctiva/sclera: Conjunctivae normal.     Pupils: Pupils are equal, round, and reactive to light.  Cardiovascular:     Rate and Rhythm: Regular rhythm. Tachycardia present.     Heart sounds: Normal heart sounds. No murmur.  Pulmonary:     Effort: Pulmonary effort is normal. No respiratory distress.     Breath sounds: Normal breath sounds. No wheezing, rhonchi or  rales.  Abdominal:     General: Bowel sounds are normal.     Palpations: Abdomen is soft.     Tenderness: There is no abdominal tenderness. There is no guarding or rebound.  Musculoskeletal:     Cervical back: Neck supple.  Skin:    General: Skin is warm and dry.  Neurological:     Mental Status: He is alert.     Comments: Mental Status:  Alert, thought content appropriate, able to give a coherent history. Speech fluent without evidence of aphasia. Able to follow 2 step commands without difficulty.  Cranial Nerves:  II:  Peripheral visual fields grossly normal, pupils equal, round, reactive to light III,IV, VI: right ptosis present, extra-ocular motions intact bilaterally  V,VII: right facial droop with no sparing of the forehead, facial light touch sensation equal VIII: hearing grossly normal to voice  X: uvula elevates symmetrically  XI: bilateral shoulder shrug symmetric and strong XII: midline tongue extension without fassiculations Motor:  Normal tone. 5/5 strength of BUE and BLE major muscle groups including strong and equal grip strength and dorsiflexion/plantar flexion Sensory: light touch normal in all extremities. Cerebellar: normal finger-to-nose with bilateral upper extremities Gait: normal gait and balance.      ED Results / Procedures / Treatments   Labs (all  labs ordered are listed, but only abnormal results are displayed) Labs Reviewed  URINALYSIS, ROUTINE W REFLEX MICROSCOPIC - Abnormal; Notable for the following components:      Result Value   Color, Urine STRAW (*)    Specific Gravity, Urine 1.031 (*)    Glucose, UA >=500 (*)    Ketones, ur 20 (*)    All other components within normal limits  BASIC METABOLIC PANEL - Abnormal; Notable for the following components:   Sodium 131 (*)    Potassium 5.3 (*)    CO2 18 (*)    Glucose, Bld 567 (*)    All other components within normal limits  CBG MONITORING, ED - Abnormal; Notable for the following components:   Glucose-Capillary 582 (*)    All other components within normal limits  CBG MONITORING, ED - Abnormal; Notable for the following components:   Glucose-Capillary 262 (*)    All other components within normal limits  I-STAT CHEM 8, ED - Abnormal; Notable for the following components:   BUN 25 (*)    Glucose, Bld 274 (*)    All other components within normal limits  CBC WITH DIFFERENTIAL/PLATELET    EKG None  Radiology No results found.  Procedures Procedures (including critical care time)  Medications Ordered in ED Medications  insulin detemir (LEVEMIR) injection 25 Units (25 Units Subcutaneous Given 02/28/19 1058)  sodium chloride 0.9 % bolus 2,000 mL (0 mLs Intravenous Stopped 02/28/19 1305)  metFORMIN (GLUCOPHAGE) tablet 1,000 mg (1,000 mg Oral Given 02/28/19 1040)  sodium chloride 0.9 % bolus 500 mL (0 mLs Intravenous Stopped 02/28/19 1443)    ED Course  I have reviewed the triage vital signs and the nursing notes.  Pertinent labs & imaging results that were available during my care of the patient were reviewed by me and considered in my medical decision making (see chart for details).    MDM Rules/Calculators/A&P                      54 year old male presenting for evaluation of hyperglycemia that was found on routine lab work yesterday by his PCP.  Reviewed  records from yesterday.  Patient had  a glucose of over 500 however he had a normal bicarb.  He was sent here due to concern for possible DKA.  Initial CBG today greater than 500. CBC nonacute BMP with pseudohyponatremia, elevated potassium which is likely secondary to hemolysis, bicarb of 18, elevated blood glucose but normal anion gap.  Normal kidney function UA with glucosuria and mild ketonuria, otherwise reassuring  Patient given his home dose of Levemir and his second dose of metformin. He was also given IVF.   I-STAT Chem-8 was repeated and showed normalization of patient's bicarb.  Potassium is also within normal limits and blood sugar is improving.  His heart rate also improved throughout the visit.  He denies any infectious symptoms.  Denies chest pain, shortness of breath so doubt ischemia as cause of his hyperglycemia.  Think he may just need titration of his diabetes medications.  Have advised him to monitor blood sugars closely and to contact his PCP especially since he has just been given a prescription for prednisone for his Bell's palsy.  Have advised him to return to the ED for new or worsening symptoms anytime.  He voices understanding the plan and reasons to return.  All questions answered.  Patient stable for discharge.  Final Clinical Impression(s) / ED Diagnoses Final diagnoses:  Hyperglycemia    Rx / DC Orders ED Discharge Orders    None       Chad Booze, PA-C 02/28/19 1443    Lennice Sites, DO 02/28/19 1635

## 2019-02-28 NOTE — ED Notes (Signed)
Patient verbalizes understanding of discharge instructions. Opportunity for questioning and answers were provided. Pt discharged from ED. 

## 2019-02-28 NOTE — Progress Notes (Signed)
Inpatient Diabetes Program Recommendations  AACE/ADA: New Consensus Statement on Inpatient Glycemic Control (2015)  Target Ranges:  Prepandial:   less than 140 mg/dL      Peak postprandial:   less than 180 mg/dL (1-2 hours)      Critically ill patients:  140 - 180 mg/dL   Lab Results  Component Value Date   GLUCAP 262 (H) 02/28/2019   HGBA1C >14.0 (H) 02/27/2019    Review of Glycemic Control Results for [name], Chad Haynes (MRN [id number]) as of 02/28/2019 14:21  Ref. Range 02/28/2019 09:16 02/28/2019 13:05  Glucose-Capillary Latest Ref Range: 70 - 99 mg/dL 582 (HH) 262 (H)   Diabetes history: DM2 Outpatient Diabetes medications: Levemir 10-30 units q hs + Metformin 1 gm bid Current orders for Inpatient glycemic control: No additional orders  Inpatient Diabetes Program Recommendations:   Noted lab blood glucose 504 02/27/19 @ 1602 @ PCP office visit. CBG then 582 today @ 0916. -Add glycemic control order set with moderate correction tid + hs 0-5 units -Levemir 25 units daily or 12 units bid -May need Novolog meal coverage if eating meals  Thank you, Bethena Roys E. Telecia Larocque, RN, MSN, CDE  Diabetes Coordinator Inpatient Glycemic Control Team Team Pager [phone] (8am-5pm) 02/28/2019 2:28 PM

## 2019-02-28 NOTE — Discharge Instructions (Signed)
Please call your doctor to set up an appointment for follow-up in regards to your elevated blood sugars.  You might need to have your diabetes medications changed in order to better control your blood sugars.  Since you have also been given a prescription for prednisone it is very important that you keep track of your blood sugars at home because this medication can increase your blood sugars even more.  Please return to the emergency department for any new or worsening symptoms in the meantime.

## 2019-03-06 ENCOUNTER — Other Ambulatory Visit: Payer: Self-pay

## 2019-03-06 ENCOUNTER — Ambulatory Visit (INDEPENDENT_AMBULATORY_CARE_PROVIDER_SITE_OTHER): Payer: BC Managed Care – PPO | Admitting: Nurse Practitioner

## 2019-03-06 ENCOUNTER — Encounter: Payer: Self-pay | Admitting: Nurse Practitioner

## 2019-03-06 VITALS — BP 114/70 | HR 81 | Temp 99.1°F | Resp 18 | Wt 225.2 lb

## 2019-03-06 DIAGNOSIS — E785 Hyperlipidemia, unspecified: Secondary | ICD-10-CM

## 2019-03-06 DIAGNOSIS — G510 Bell's palsy: Secondary | ICD-10-CM

## 2019-03-06 DIAGNOSIS — E538 Deficiency of other specified B group vitamins: Secondary | ICD-10-CM

## 2019-03-06 DIAGNOSIS — E1165 Type 2 diabetes mellitus with hyperglycemia: Secondary | ICD-10-CM

## 2019-03-06 DIAGNOSIS — Z09 Encounter for follow-up examination after completed treatment for conditions other than malignant neoplasm: Secondary | ICD-10-CM

## 2019-03-06 DIAGNOSIS — E559 Vitamin D deficiency, unspecified: Secondary | ICD-10-CM

## 2019-03-06 MED ORDER — SIMVASTATIN 80 MG PO TABS: 80.0000 mg | ORAL_TABLET | Freq: Every day | ORAL | 3 refills | Status: DC

## 2019-03-06 MED ORDER — PREDNISONE 10 MG PO TABS: ORAL_TABLET | ORAL | 0 refills | Status: DC

## 2019-03-06 NOTE — Progress Notes (Signed)
Acute Office Visit  Subjective:    Patient ID: Chad Haynes., male    DOB: March 10, 1965, 54 y.o.   MRN: [id number]  Chief Complaint  Patient presents with  . Follow-up    1 week f/u, bells palsy no better, some dehydration    HPI Patient is a 54 y.o caucasian male presenting to clinic for follow up 1 week after initiating tx for Bells palsy. Instructions for plan discussed with pt. He reports that his initial sxs of right facial weakness and drooping that effects the lips, jaw, and eye have greatly improved. He has a couple of days left of 68m prednisone and RX will be sent to continue for another week with taper dose. He will plan to follow up in office in 1 week.   ER follow up: Pt denied taking his BG and following S/S insulin as prescribed after labs reviewed. He has taken Bystolic and today his BP and HR are stable. He reports that he had stopped taking Statin as his script ran out. RX refill. Time spent reviewing labs and plan as per notes. He v/l desire to adhere to plan with follow up labs and appt in 4 months. Having lab draw 1 week prior to appt.   No cp/ct, gu/gi sxs, pain, sob, or falls.  Past Medical History:  Diagnosis Date  . Depression   . [place]   . GERD (gastroesophageal reflux disease)   . H. pylori infection   . Hyperlipidemia   . Hypogonadism male   . Leg pain   . Low magnesium levels   . Neck pain   . Rhinitis, allergic   . Vitamin B12 deficiency   . Vitamin D deficiency   . White coat hypertension     Past Surgical History:  Procedure Laterality Date  . KNEE ARTHROSCOPY Right     Family History  Problem Relation Age of Onset  . COPD Mother        smoker  . Hypertension Father     Social History   Socioeconomic History  . Marital status: Married    Spouse name: Not on file  . Number of children: Not on file  . Years of education: Not on file  . Highest education level: Not on file  Occupational  History  . Not on file  Tobacco Use  . Smoking status: Former Smoker    Quit date: 06/08/1991    Years since quitting: 27.7  . Smokeless tobacco: Never Used  Substance and Sexual Activity  . Alcohol use: Yes    Alcohol/week: 0.0 standard drinks    Comment: occasionally  . Drug use: No  . Sexual activity: Yes  Other Topics Concern  . Not on file  Social History Narrative   Married. No children. Does not formal exercise.   [facility] [employer]. Works part-time job at night.   "Has to walk a mile from the car to classic [employer]   Social Determinants of Health   Financial Resource Strain:   . Difficulty of Paying Living Expenses: Not on file  Food Insecurity:   . Worried About RCharity fundraiserin the Last Year: Not on file  . Ran Out of Food in the Last Year: Not on file  Transportation Needs:   . Lack of Transportation (Medical): Not on file  . Lack of Transportation (Non-Medical): Not on file  Physical Activity:   . Days of Exercise per Week: Not on file  .  Minutes of Exercise per Session: Not on file  Stress:   . Feeling of Stress : Not on file  Social Connections:   . Frequency of Communication with Friends and Family: Not on file  . Frequency of Social Gatherings with Friends and Family: Not on file  . Attends Religious Services: Not on file  . Active Member of Clubs or Organizations: Not on file  . Attends Archivist Meetings: Not on file  . Marital Status: Not on file  Intimate Partner Violence:   . Fear of Current or Ex-Partner: Not on file  . Emotionally Abused: Not on file  . Physically Abused: Not on file  . Sexually Abused: Not on file    Outpatient Medications Prior to Visit  Medication Sig Dispense Refill  . aspirin 81 MG tablet Take 1 tablet (81 mg total) by mouth daily. 30 tablet 11  . BD PEN NEEDLE NANO 2ND GEN 32G X 4 MM MISC USE AS DIRECTED DAILY WITH INSULIN PEN 100 each 1  . Blood Glucose Monitoring Suppl (BLOOD GLUCOSE SYSTEM  PAK) KIT Please dispense based on patient and insurance preference. Use as directed to monitor FSBS 2x daily. Dx: E11.9 1 each 1  . Glucose Blood (BLOOD GLUCOSE TEST STRIPS) STRP Please dispense based on patient and insurance preference. Use as directed to monitor blood sugar 2-3 x a day. Dx: E11.65, Z79.4 100 each 5  . insulin regular (HUMULIN R) 100 units/mL injection BG 150-200 take 2 Units BG 251-300 take 4 Units BG 301-350 take 6 Units BG 351-400 take 8 Units BG 401-450 take 10 Units and make appointment with doctor 10 mL 11  . Lancets MISC Please dispense based on patient and insurance preference. Use as directed to monitor blood sugar 2 to 3 x daily. Dx: E11.65, Z79.4 100 each 5  . LEVEMIR FLEXTOUCH 100 UNIT/ML Pen INJECT 10 TO 30 UNITS INTO THE SKIN AT BEDTIME NEEDS APPT FOR REFILLS 15 mL 0  . metFORMIN (GLUCOPHAGE) 1000 MG tablet Take 1 tablet (1,000 mg total) by mouth 2 (two) times daily. TAKE 1 TABLET BY MOUTH TWICE A DAY WITH A MEAL 180 tablet 1  . nebivolol (BYSTOLIC) 2.5 MG tablet Take 1 tablet (2.5 mg total) by mouth daily. 30 tablet 3  . omeprazole (PRILOSEC) 40 MG capsule Take 1 capsule (40 mg total) by mouth daily. 90 capsule 2  . PARoxetine (PAXIL) 20 MG tablet Take 1 tablet (20 mg total) by mouth daily. 90 tablet 3  . predniSONE (DELTASONE) 20 MG tablet Day one through seven take 3m daily.  Follow up Monday for taper dose. 28 tablet 0  . tadalafil (CIALIS) 5 MG tablet TAKE 1-2 TABLETS BY MOUTH DAILY AS NEEDED FOR ERECTILE DYSFUNCTION. (Patient not taking: Reported on 02/27/2019) 20 tablet 0  . simvastatin (ZOCOR) 80 MG tablet Take 1 tablet (80 mg total) by mouth daily at 6 PM. TAKE 1 TABLET BY MOUTH EVERY DAY (Patient not taking: Reported on 02/27/2019) 90 tablet 3   No facility-administered medications prior to visit.    No Known Allergies  Review of Systems  All other systems reviewed and are negative.      Objective:    Physical Exam Vitals and nursing note reviewed.   Constitutional:      Appearance: Normal appearance. He is well-developed and well-groomed.  HENT:     Head: Normocephalic.     Right Ear: External ear normal.     Left Ear: External ear normal.  Nose: Nose normal.     Mouth/Throat:     Lips: Pink.     Mouth: Mucous membranes are moist.     Tongue: Tongue does not deviate from midline.      Comments: Nearly unremarkable at this point from last examination 1 week ago. Eyes:     General: Lids are normal. Lids are everted, no foreign bodies appreciated.     Extraocular Movements: Extraocular movements intact.     Conjunctiva/sclera: Conjunctivae normal.     Pupils: Pupils are equal, round, and reactive to light.      Comments: Nearly unremarkable from 1 week ago, able to close eye without difficulty/  Cardiovascular:     Rate and Rhythm: Normal rate.  Pulmonary:     Effort: Pulmonary effort is normal.  Musculoskeletal:     Cervical back: Full passive range of motion without pain, normal range of motion and neck supple.  Skin:    General: Skin is warm and dry.     Capillary Refill: Capillary refill takes less than 2 seconds.  Neurological:     General: No focal deficit present.     Mental Status: He is alert and oriented to person, place, and time.     Cranial Nerves: Cranial nerves are intact.  Psychiatric:        Mood and Affect: Mood normal.        Behavior: Behavior normal. Behavior is cooperative.        Thought Content: Thought content normal.        Judgment: Judgment normal.     BP 114/70 (BP Location: Left Arm, Patient Position: Sitting, Cuff Size: Large)   Pulse 81   Temp 99.1 F (37.3 C) (Oral)   Resp 18   Wt 225 lb 3.2 oz (102.2 kg)   SpO2 95%   BMI 32.31 kg/m  Wt Readings from Last 3 Encounters:  03/06/19 225 lb 3.2 oz (102.2 kg)  02/28/19 228 lb (103.4 kg)  02/27/19 228 lb 6.4 oz (103.6 kg)    Health Maintenance Due  Topic Date Due  . OPHTHALMOLOGY EXAM  01/24/2015  . FOOT EXAM  06/08/2015  .  COLONOSCOPY  07/05/2016  . INFLUENZA VACCINE  08/13/2018  . URINE MICROALBUMIN  03/11/2019    There are no preventive care reminders to display for this patient.   Lab Results  Component Value Date   TSH 0.92 03/10/2018   Lab Results  Component Value Date   WBC 6.2 02/28/2019   HGB 13.9 02/28/2019   HCT 41.0 02/28/2019   MCV 82.1 02/28/2019   PLT 353 02/28/2019   Lab Results  Component Value Date   NA 137 02/28/2019   K 4.4 02/28/2019   CO2 18 (L) 02/28/2019   GLUCOSE 274 (H) 02/28/2019   BUN 25 (H) 02/28/2019   CREATININE 0.70 02/28/2019   BILITOT 0.5 02/27/2019   ALKPHOS 91 05/07/2016   AST 15 02/27/2019   ALT 15 02/27/2019   PROT 7.3 02/27/2019   ALBUMIN 4.3 05/07/2016   CALCIUM 9.4 02/28/2019   ANIONGAP 15 02/28/2019   Lab Results  Component Value Date   CHOL 244 (H) 02/27/2019   Lab Results  Component Value Date   HDL 32 (L) 02/27/2019   Lab Results  Component Value Date   [facility]  02/27/2019     Comment:     . LDL cholesterol not calculated. Triglyceride levels greater than 400 mg/dL invalidate calculated LDL results. . Reference range: <100 . Desirable range <  100 mg/dL for primary prevention;   <70 mg/dL for patients with CHD or diabetic patients  with > or = 2 CHD risk factors. Marland Kitchen LDL-C is now calculated using the Martin-Hopkins  calculation, which is a validated novel method providing  better accuracy than the Friedewald equation in the  estimation of LDL-C.  Cresenciano Genre et al. Annamaria Helling. 4718;550(15): 2061-2068  (http://education.[facility].com/faq/FAQ164)    Lab Results  Component Value Date   TRIG 715 (H) 02/27/2019   Lab Results  Component Value Date   CHOLHDL 7.6 (H) 02/27/2019   Lab Results  Component Value Date   HGBA1C >14.0 (H) 02/27/2019       Assessment & Plan:   Bell Palsy greatly improved. New RX sent and to be started once you have completed Prednisone RX you are now taking.  Follow up in 1 week for  re-examination Take medications as prescribed  Follow diabetic diet and take approx 30 minutes prior to b/l/d meals with s/s insulin Plan to follow up in 4 months for repeat labs 1 week prior to the apt Seek medical attention for worsening or non resolving sxs.  Problem List Items Addressed This Visit      Other   Hyperlipidemia   Relevant Medications   simvastatin (ZOCOR) 80 MG tablet    Other Visit Diagnoses    Facial paralysis/Bells palsy    -  Primary   Relevant Medications   predniSONE (DELTASONE) 10 MG tablet       Meds ordered this encounter  Medications  . predniSONE (DELTASONE) 10 MG tablet    Sig: Once you have finished your prior script of Prednisone start taking this precription as follows Take 6 tabs day 1-6 Take 5 tabs day 5 Take 4 tabs day 6 Take 3 tabs day 7 Take 2 tabs day 8 Take 1 tab day 9    Dispense:  51 tablet    Refill:  0  . simvastatin (ZOCOR) 80 MG tablet    Sig: Take 1 tablet (80 mg total) by mouth daily at 6 PM. TAKE 1 TABLET BY MOUTH EVERY DAY    Dispense:  90 tablet    Refill:  3   Follow up 1 week for bell palsy re-examination 4 months for labs and follow up disease management   Annie Main, FNP

## 2019-03-08 ENCOUNTER — Other Ambulatory Visit: Payer: Self-pay | Admitting: Nurse Practitioner

## 2019-03-08 ENCOUNTER — Telehealth: Payer: Self-pay

## 2019-03-08 DIAGNOSIS — E1165 Type 2 diabetes mellitus with hyperglycemia: Secondary | ICD-10-CM

## 2019-03-08 MED ORDER — INSULIN REGULAR HUMAN 100 UNIT/ML IJ SOLN: INTRAMUSCULAR | 11 refills | Status: DC

## 2019-03-08 NOTE — Telephone Encounter (Signed)
As per last visit, pt is to not titrate Levimir as he has done in the past. I prescribed sliding scale for meals and increased his metformin. This is what my directions are. His A1C is in double digits partly related to non compliance and self dosing. Please review directions again with the pt and or wife with his permission based on last visit.   Certainly in the future we can discuss other regimens but this is the plan to get the A1C under control .

## 2019-03-08 NOTE — Telephone Encounter (Signed)
Pt's wife Sharyn Lull called and stated that the levemir is not working for pt anymore. She would like know if you can add gliperide to his meds as it worked great per wife. She stated that pt's fasting bs was 268. Sharyn Lull states that the insurance has not approved the Humulin. Please advise.

## 2019-03-09 ENCOUNTER — Other Ambulatory Visit: Payer: Self-pay

## 2019-03-09 DIAGNOSIS — E1165 Type 2 diabetes mellitus with hyperglycemia: Secondary | ICD-10-CM

## 2019-03-09 DIAGNOSIS — IMO0002 Reserved for concepts with insufficient information to code with codable children: Secondary | ICD-10-CM

## 2019-03-09 MED ORDER — LANCETS MISC: 5 refills | Status: DC

## 2019-03-12 ENCOUNTER — Other Ambulatory Visit: Payer: Self-pay | Admitting: Nurse Practitioner

## 2019-03-12 DIAGNOSIS — E1165 Type 2 diabetes mellitus with hyperglycemia: Secondary | ICD-10-CM

## 2019-03-12 DIAGNOSIS — IMO0002 Reserved for concepts with insufficient information to code with codable children: Secondary | ICD-10-CM

## 2019-03-13 ENCOUNTER — Ambulatory Visit (INDEPENDENT_AMBULATORY_CARE_PROVIDER_SITE_OTHER): Payer: BC Managed Care – PPO | Admitting: Nurse Practitioner

## 2019-03-13 ENCOUNTER — Other Ambulatory Visit: Payer: Self-pay

## 2019-03-13 VITALS — BP 110/72 | HR 88 | Temp 98.8°F | Resp 18 | Ht 70.0 in | Wt 226.2 lb

## 2019-03-13 DIAGNOSIS — G510 Bell's palsy: Secondary | ICD-10-CM | POA: Diagnosis not present

## 2019-03-13 DIAGNOSIS — Z09 Encounter for follow-up examination after completed treatment for conditions other than malignant neoplasm: Secondary | ICD-10-CM | POA: Diagnosis not present

## 2019-03-13 DIAGNOSIS — E1165 Type 2 diabetes mellitus with hyperglycemia: Secondary | ICD-10-CM

## 2019-03-13 NOTE — Progress Notes (Signed)
Acute Office Visit  Subjective:    Patient ID: Chad Boozer., male    DOB: 02-09-1965, 54 y.o.   MRN: [id number]  Chief Complaint  Patient presents with  . Follow-up    bells palsy has gotten better    HPI Patient is a 54 y.o.male presenting for third follow up post facial palsy treatment initiation. Today his sxs have resolved. Time spent discussing sliding scale and insulin tx plan. He is following s/s R that his insurance has just approved and he is taking a set dose of Levemir at 15 units per dose rather than titrated himself. His reported fasting biot while on prednisone for facial palsy has been 210-350. No cp/ct, sob, gu/gi sxs, facial, eye, mouth, or tongue deviation.   Past Medical History:  Diagnosis Date  . Depression   . Diabetes mellitus without complication ([place])   . GERD (gastroesophageal reflux disease)   . H. pylori infection   . Hyperlipidemia   . Hypogonadism male   . Leg pain   . Low magnesium levels   . Neck pain   . Rhinitis, allergic   . Vitamin B12 deficiency   . Vitamin D deficiency   . White coat hypertension     Past Surgical History:  Procedure Laterality Date  . KNEE ARTHROSCOPY Right     Family History  Problem Relation Age of Onset  . COPD Mother        smoker  . Hypertension Father     Social History   Socioeconomic History  . Marital status: Married    Spouse name: Not on file  . Number of children: Not on file  . Years of education: Not on file  . Highest education level: Not on file  Occupational History  . Not on file  Tobacco Use  . Smoking status: Former Smoker    Quit date: 06/08/1991    Years since quitting: 27.7  . Smokeless tobacco: Never Used  Substance and Sexual Activity  . Alcohol use: Yes    Alcohol/week: 0.0 standard drinks    Comment: occasionally  . Drug use: No  . Sexual activity: Yes  Other Topics Concern  . Not on file  Social History Narrative   Married. No children. Does not formal exercise.    In school at [employer] full-time. Works part-time job at night.   "Has to walk a mile from the car to classic [employer]"   Social Determinants of Health   Financial Resource Strain:   . Difficulty of Paying Living Expenses: Not on file  Food Insecurity:   . Worried About Charity fundraiser in the Last Year: Not on file  . Ran Out of Food in the Last Year: Not on file  Transportation Needs:   . Lack of Transportation (Medical): Not on file  . Lack of Transportation (Non-Medical): Not on file  Physical Activity:   . Days of Exercise per Week: Not on file  . Minutes of Exercise per Session: Not on file  Stress:   . Feeling of Stress : Not on file  Social Connections:   . Frequency of Communication with Friends and Family: Not on file  . Frequency of Social Gatherings with Friends and Family: Not on file  . Attends Religious Services: Not on file  . Active Member of Clubs or Organizations: Not on file  . Attends Archivist Meetings: Not on file  . Marital Status: Not on file  Intimate Partner Violence:   .  Fear of Current or Ex-Partner: Not on file  . Emotionally Abused: Not on file  . Physically Abused: Not on file  . Sexually Abused: Not on file    Outpatient Medications Prior to Visit  Medication Sig Dispense Refill  . aspirin 81 MG tablet Take 1 tablet (81 mg total) by mouth daily. 30 tablet 11  . BD PEN NEEDLE NANO 2ND GEN 32G X 4 MM MISC USE AS DIRECTED DAILY WITH INSULIN PEN 100 each 1  . Blood Glucose Monitoring Suppl (BLOOD GLUCOSE SYSTEM PAK) KIT Please dispense based on patient and insurance preference. Use as directed to monitor FSBS 2x daily. Dx: E11.9 1 each 1  . Glucose Blood (BLOOD GLUCOSE TEST STRIPS) STRP Please dispense based on patient and insurance preference. Use as directed to monitor blood sugar 2-3 x a day. Dx: E11.65, Z79.4 100 each 5  . insulin regular (NOVOLIN R) 100 units/mL injection Bg 150-200 take 2 units Bg 201-250 take 4 units Bg 251-300 take 6  units Bg 301-350 take 8 units Bg 351-400 take 10 units Bg 401-450 take 12 units and make appointment 10 mL 11  . Lancets MISC Please dispense based on patient and insurance preference. Use as directed to monitor blood sugar 2 to 3 x daily. Dx: E11.65, Z79.4 100 each 5  . LEVEMIR FLEXTOUCH 100 UNIT/ML Pen INJECT 10 TO 30 UNITS INTO THE SKIN AT BEDTIME NEEDS APPT FOR REFILLS 15 mL 0  . metFORMIN (GLUCOPHAGE) 1000 MG tablet Take 1 tablet (1,000 mg total) by mouth 2 (two) times daily. TAKE 1 TABLET BY MOUTH TWICE A DAY WITH A MEAL 180 tablet 1  . nebivolol (BYSTOLIC) 2.5 MG tablet Take 1 tablet (2.5 mg total) by mouth daily. 30 tablet 3  . omeprazole (PRILOSEC) 40 MG capsule Take 1 capsule (40 mg total) by mouth daily. 90 capsule 2  . PARoxetine (PAXIL) 20 MG tablet Take 1 tablet (20 mg total) by mouth daily. 90 tablet 3  . predniSONE (DELTASONE) 10 MG tablet Once you have finished your prior script of Prednisone start taking this precription as follows Take 6 tabs day 1-6 Take 5 tabs day 5 Take 4 tabs day 6 Take 3 tabs day 7 Take 2 tabs day 8 Take 1 tab day 9 51 tablet 0  . simvastatin (ZOCOR) 80 MG tablet Take 1 tablet (80 mg total) by mouth daily at 6 PM. TAKE 1 TABLET BY MOUTH EVERY DAY 90 tablet 3  . tadalafil (CIALIS) 5 MG tablet TAKE 1-2 TABLETS BY MOUTH DAILY AS NEEDED FOR ERECTILE DYSFUNCTION. 20 tablet 0  . predniSONE (DELTASONE) 20 MG tablet Day one through seven take 72m daily.  Follow up Monday for taper dose. 28 tablet 0   No facility-administered medications prior to visit.    No Known Allergies  Review of Systems  All other systems reviewed and are negative.      Objective:    Physical Exam Vitals and nursing note reviewed.  Constitutional:      Appearance: Normal appearance.  HENT:     Head: Normocephalic.     Jaw: There is normal jaw occlusion.     Right Ear: External ear normal.     Left Ear: External ear normal.     Nose: Nose normal. No nasal deformity.      Mouth/Throat:     Lips: Pink.     Mouth: Mucous membranes are moist.     Tongue: Tongue does not deviate from midline.  Pharynx: Oropharynx is clear.     Comments: No deviation to lips/normal Eyes:     Extraocular Movements: Extraocular movements intact.     Conjunctiva/sclera: Conjunctivae normal.     Pupils: Pupils are equal, round, and reactive to light.  Cardiovascular:     Rate and Rhythm: Normal rate.     Pulses: Normal pulses.  Pulmonary:     Effort: Pulmonary effort is normal.  Musculoskeletal:        General: Normal range of motion.  Skin:    General: Skin is warm and dry.     Capillary Refill: Capillary refill takes less than 2 seconds.  Neurological:     General: No focal deficit present.     Mental Status: He is alert and oriented to person, place, and time.  Psychiatric:        Attention and Perception: Attention normal.        Mood and Affect: Mood normal.        Speech: Speech normal.        Behavior: Behavior normal. Behavior is cooperative.        Thought Content: Thought content normal.        Cognition and Memory: Cognition normal.        Judgment: Judgment normal.     BP 110/72 (BP Location: Left Arm, Patient Position: Sitting, Cuff Size: Large)   Pulse 88   Temp 98.8 F (37.1 C) (Oral)   Resp 18   Ht '5\' 10"'  (1.778 m)   Wt 226 lb 3.2 oz (102.6 kg)   SpO2 95%   BMI 32.46 kg/m  Wt Readings from Last 3 Encounters:  03/13/19 226 lb 3.2 oz (102.6 kg)  03/06/19 225 lb 3.2 oz (102.2 kg)  02/28/19 228 lb (103.4 kg)    Health Maintenance Due  Topic Date Due  . OPHTHALMOLOGY EXAM  01/24/2015  . FOOT EXAM  06/08/2015  . COLONOSCOPY  07/05/2016  . INFLUENZA VACCINE  08/13/2018  . URINE MICROALBUMIN  03/11/2019     Lab Results  Component Value Date   TSH 0.92 03/10/2018   Lab Results  Component Value Date   WBC 6.2 02/28/2019   HGB 13.9 02/28/2019   HCT 41.0 02/28/2019   MCV 82.1 02/28/2019   PLT 353 02/28/2019   Lab Results    Component Value Date   NA 137 02/28/2019   K 4.4 02/28/2019   CO2 18 (L) 02/28/2019   GLUCOSE 274 (H) 02/28/2019   BUN 25 (H) 02/28/2019   CREATININE 0.70 02/28/2019   BILITOT 0.5 02/27/2019   ALKPHOS 91 05/07/2016   AST 15 02/27/2019   ALT 15 02/27/2019   PROT 7.3 02/27/2019   ALBUMIN 4.3 05/07/2016   CALCIUM 9.4 02/28/2019   ANIONGAP 15 02/28/2019   Lab Results  Component Value Date   CHOL 244 (H) 02/27/2019   Lab Results  Component Value Date   HDL 32 (L) 02/27/2019   Lab Results  Component Value Date   [facility]  02/27/2019     Comment:     . LDL cholesterol not calculated. Triglyceride levels greater than 400 mg/dL invalidate calculated LDL results. . Reference range: <100 . Desirable range <100 mg/dL for primary prevention;   <70 mg/dL for patients with CHD or diabetic patients  with > or = 2 CHD risk factors. Marland Kitchen LDL-C is now calculated using the Martin-Hopkins  calculation, which is a validated novel method providing  better accuracy than the Friedewald equation in the  estimation of LDL-C.  Cresenciano Genre et al. Annamaria Helling. 7169;678(93): 2061-2068  (http://education.[facility].com/faq/FAQ164)    Lab Results  Component Value Date   TRIG 715 (H) 02/27/2019   Lab Results  Component Value Date   CHOLHDL 7.6 (H) 02/27/2019   Lab Results  Component Value Date   HGBA1C >14.0 (H) 02/27/2019       Assessment & Plan:   Problem List Items Addressed This Visit      Endocrine   Uncontrolled type 2 diabetes mellitus with hyperglycemia ([place])    Other Visit Diagnoses    Follow-up exam    -  Primary   Facial paralysis/Bells palsy           No orders of the defined types were placed in this encounter.  Bell Palsy greatly resolved Continue to take medications as prescribed  Continue to follow diabetic diet and take approx 30 minutes prior to b/l/d meals with s/s insulin Plan to follow up in 4 months for repeat labs 1 week prior to the apt Seek medical  attention for worsening or non resolving sxs.  Annie Main, FNP

## 2019-04-04 ENCOUNTER — Other Ambulatory Visit: Payer: Self-pay | Admitting: Nurse Practitioner

## 2019-04-04 ENCOUNTER — Telehealth: Payer: Self-pay

## 2019-04-04 ENCOUNTER — Other Ambulatory Visit: Payer: Self-pay

## 2019-04-04 MED ORDER — FREESTYLE LIBRE 14 DAY SENSOR MISC: 1.0000 | 0 refills | Status: DC | PRN

## 2019-04-04 NOTE — Telephone Encounter (Signed)
Pt's wife called to report that pt's insurance will not pay for bystolic and would like to know if he can go back on the lisinopril. Please advise.

## 2019-04-04 NOTE — Telephone Encounter (Signed)
He should have a follow up with me this week or next week. What is his home blood pressure and heart rate?

## 2019-04-04 NOTE — Telephone Encounter (Signed)
lvmtrc  

## 2019-04-26 ENCOUNTER — Other Ambulatory Visit: Payer: Self-pay

## 2019-04-26 MED ORDER — PAROXETINE HCL 20 MG PO TABS: 20.0000 mg | ORAL_TABLET | Freq: Every day | ORAL | 3 refills | Status: DC

## 2019-05-22 ENCOUNTER — Other Ambulatory Visit: Payer: Self-pay | Admitting: *Deleted

## 2019-05-22 DIAGNOSIS — E1165 Type 2 diabetes mellitus with hyperglycemia: Secondary | ICD-10-CM

## 2019-05-22 MED ORDER — BD PEN NEEDLE NANO 2ND GEN 32G X 4 MM MISC: 1 refills | Status: DC

## 2019-05-24 ENCOUNTER — Other Ambulatory Visit: Payer: Self-pay

## 2019-05-24 ENCOUNTER — Telehealth: Payer: Self-pay | Admitting: Nurse Practitioner

## 2019-05-24 DIAGNOSIS — IMO0002 Reserved for concepts with insufficient information to code with codable children: Secondary | ICD-10-CM

## 2019-05-24 MED ORDER — LEVEMIR FLEXTOUCH 100 UNIT/ML ~~LOC~~ SOPN: PEN_INJECTOR | SUBCUTANEOUS | 0 refills | Status: DC

## 2019-05-24 NOTE — Telephone Encounter (Signed)
Cb#[phone] refill Levemir Flextouch 100 unit/ml pen

## 2019-06-29 ENCOUNTER — Other Ambulatory Visit: Payer: Self-pay | Admitting: Nurse Practitioner

## 2019-07-05 ENCOUNTER — Other Ambulatory Visit: Payer: Self-pay | Admitting: Nurse Practitioner

## 2019-07-05 DIAGNOSIS — IMO0002 Reserved for concepts with insufficient information to code with codable children: Secondary | ICD-10-CM

## 2019-07-13 ENCOUNTER — Other Ambulatory Visit: Payer: Self-pay | Admitting: Nurse Practitioner

## 2019-07-13 ENCOUNTER — Other Ambulatory Visit: Payer: Self-pay

## 2019-07-13 ENCOUNTER — Ambulatory Visit (INDEPENDENT_AMBULATORY_CARE_PROVIDER_SITE_OTHER): Payer: BC Managed Care – PPO | Admitting: Nurse Practitioner

## 2019-07-13 VITALS — BP 140/90 | HR 84 | Temp 97.6°F | Resp 18 | Wt 226.6 lb

## 2019-07-13 DIAGNOSIS — Z1211 Encounter for screening for malignant neoplasm of colon: Secondary | ICD-10-CM

## 2019-07-13 DIAGNOSIS — R2242 Localized swelling, mass and lump, left lower limb: Secondary | ICD-10-CM

## 2019-07-13 DIAGNOSIS — Z1159 Encounter for screening for other viral diseases: Secondary | ICD-10-CM

## 2019-07-13 DIAGNOSIS — Z125 Encounter for screening for malignant neoplasm of prostate: Secondary | ICD-10-CM

## 2019-07-13 DIAGNOSIS — E1165 Type 2 diabetes mellitus with hyperglycemia: Secondary | ICD-10-CM

## 2019-07-13 DIAGNOSIS — H6121 Impacted cerumen, right ear: Secondary | ICD-10-CM

## 2019-07-13 DIAGNOSIS — E785 Hyperlipidemia, unspecified: Secondary | ICD-10-CM

## 2019-07-13 DIAGNOSIS — Z1212 Encounter for screening for malignant neoplasm of rectum: Secondary | ICD-10-CM

## 2019-07-13 DIAGNOSIS — IMO0002 Reserved for concepts with insufficient information to code with codable children: Secondary | ICD-10-CM

## 2019-07-13 DIAGNOSIS — Z794 Long term (current) use of insulin: Secondary | ICD-10-CM

## 2019-07-13 MED ORDER — LEVEMIR FLEXTOUCH 100 UNIT/ML ~~LOC~~ SOPN: PEN_INJECTOR | SUBCUTANEOUS | 0 refills | Status: DC

## 2019-07-13 MED ORDER — INSULIN REGULAR HUMAN 100 UNIT/ML IJ SOLN: INTRAMUSCULAR | 11 refills | Status: DC

## 2019-07-13 MED ORDER — GLIPIZIDE 5 MG PO TABS: 5.0000 mg | ORAL_TABLET | Freq: Two times a day (BID) | ORAL | 3 refills | Status: DC

## 2019-07-13 NOTE — Progress Notes (Addendum)
Established Patient Office Visit  Subjective:  Patient ID: Chad Haynes., male    DOB: [birth year]  Age: 54 y.o. MRN: [id number]  CC:  Chief Complaint  Patient presents with  . Diabetes    follow up    HPI [name]. is a 54 year old male presenting for follow up from 4 months ago for his DM2 with high BG.  The pt has a few sxs that he would like addressed today: Decreased hearing in right ear and Left upper outer thigh with lump no skin opening or drainage. The ear sxs started approx in March and the leg lump started approx. 6 months ago. No txs have been tried.   Related the his Diabetes management Not following sliding scale exactly wife scared to use dinner insulin, wants to do better over next 4 months feel today's a1c will be better, reports fasting bg have remained just above 200. The pt reports that his Levemir that he had reported he was taking last visit per her previous provider 10-30 units he actually has been taking 20 units in the AM and 20 units in the PM. He continues to take Metformin 1000 mg twice daily.   No cp, ct, gu/gi sxs, pain, sob, edema, or falls.   Past Medical History:  Diagnosis Date  . Depression   . Diabetes mellitus without complication ([place])   . GERD (gastroesophageal reflux disease)   . H. pylori infection   . Hyperlipidemia   . Hypogonadism male   . Leg pain   . Low magnesium levels   . Neck pain   . Rhinitis, allergic   . Vitamin B12 deficiency   . Vitamin D deficiency   . White coat hypertension     Past Surgical History:  Procedure Laterality Date  . KNEE ARTHROSCOPY Right     Family History  Problem Relation Age of Onset  . COPD Mother        smoker  . Hypertension Father     Social History   Socioeconomic History  . Marital status: Married    Spouse name: Not on file  . Number of children: Not on file  . Years of education: Not on file  . Highest education level: Not on file  Occupational History  . Not on file   Tobacco Use  . Smoking status: Former Smoker    Quit date: 06/08/1991    Years since quitting: 28.1  . Smokeless tobacco: Never Used  Substance and Sexual Activity  . Alcohol use: Yes    Alcohol/week: 0.0 standard drinks    Comment: occasionally  . Drug use: No  . Sexual activity: Yes  Other Topics Concern  . Not on file  Social History Narrative   Married. No children. Does not formal exercise.   In school at [employer] full-time. Works part-time job at night.   "Has to walk a mile from the car to classic [employer]"   Social Determinants of Health   Financial Resource Strain:   . Difficulty of Paying Living Expenses:   Food Insecurity:   . Worried About Charity fundraiser in the Last Year:   . Arboriculturist in the Last Year:   Transportation Needs:   . Film/video editor (Medical):   Marland Kitchen Lack of Transportation (Non-Medical):   Physical Activity:   . Days of Exercise per Week:   . Minutes of Exercise per Session:   Stress:   . Feeling of Stress :  Social Connections:   . Frequency of Communication with Friends and Family:   . Frequency of Social Gatherings with Friends and Family:   . Attends Religious Services:   . Active Member of Clubs or Organizations:   . Attends Archivist Meetings:   Marland Kitchen Marital Status:   Intimate Partner Violence:   . Fear of Current or Ex-Partner:   . Emotionally Abused:   Marland Kitchen Physically Abused:   . Sexually Abused:     Outpatient Medications Prior to Visit  Medication Sig Dispense Refill  . aspirin 81 MG tablet Take 1 tablet (81 mg total) by mouth daily. 30 tablet 11  . BD VEO INSULIN SYRINGE U/F 31G X 15/64" 0.3 ML MISC USE WITH SLIDING SCALE INSULIN 100 each 1  . Blood Glucose Monitoring Suppl (BLOOD GLUCOSE SYSTEM PAK) KIT Please dispense based on patient and insurance preference. Use as directed to monitor FSBS 2x daily. Dx: E11.9 1 each 1  . BYSTOLIC 2.5 MG tablet TAKE 1 TABLET BY MOUTH EVERY DAY 30 tablet 3  . Continuous Blood  Gluc Sensor (FREESTYLE LIBRE 14 DAY SENSOR) MISC 1 each by Does not apply route as needed. 1 each 0  . Glucose Blood (BLOOD GLUCOSE TEST STRIPS) STRP Please dispense based on patient and insurance preference. Use as directed to monitor blood sugar 2-3 x a day. Dx: E11.65, Z79.4 100 each 5  . Insulin Pen Needle (BD PEN NEEDLE NANO 2ND GEN) 32G X 4 MM MISC USE AS DIRECTED DAILY WITH INSULIN PEN 100 each 1  . metFORMIN (GLUCOPHAGE) 1000 MG tablet Take 1 tablet (1,000 mg total) by mouth 2 (two) times daily. TAKE 1 TABLET BY MOUTH TWICE A DAY WITH A MEAL (Patient taking differently: Take 1,000 mg by mouth 2 (two) times daily. TAKE 1 TABLET BY MOUTH TWICE A DAY WITH A MEAL) 180 tablet 1  . omeprazole (PRILOSEC) 40 MG capsule Take 1 capsule (40 mg total) by mouth daily. 90 capsule 2  . PARoxetine (PAXIL) 20 MG tablet Take 1 tablet (20 mg total) by mouth daily. 90 tablet 3  . simvastatin (ZOCOR) 80 MG tablet Take 1 tablet (80 mg total) by mouth daily at 6 PM. TAKE 1 TABLET BY MOUTH EVERY DAY (Patient taking differently: Take 80 mg by mouth daily at 6 PM. TAKE 1 TABLET BY MOUTH EVERY DAY) 90 tablet 3  . insulin regular (NOVOLIN R) 100 units/mL injection Bg 150-200 take 2 units Bg 201-250 take 4 units Bg 251-300 take 6 units Bg 301-350 take 8 units Bg 351-400 take 10 units Bg 401-450 take 12 units and make appointment (Patient taking differently: Bg 150-200 take 2 units Bg 201-250 take 4 units Bg 251-300 take 6 units Bg 301-350 take 8 units Bg 351-400 take 10 units Bg 401-450 take 12 units and make appointment) 10 mL 11  . LEVEMIR FLEXTOUCH 100 UNIT/ML FlexPen INJECT 10 TO 30 UNITS INTO THE SKIN AT BEDTIME NEEDS APPT FOR REFILLS 15 mL 0  . predniSONE (DELTASONE) 10 MG tablet Once you have finished your prior script of Prednisone start taking this precription as follows Take 6 tabs day 1-6 Take 5 tabs day 5 Take 4 tabs day 6 Take 3 tabs day 7 Take 2 tabs day 8 Take 1 tab day 9 51 tablet 0  . tadalafil (CIALIS) 5 MG  tablet TAKE 1-2 TABLETS BY MOUTH DAILY AS NEEDED FOR ERECTILE DYSFUNCTION. 20 tablet 0   No facility-administered medications prior to visit.  No Known Allergies  ROS Review of Systems  All other systems reviewed and are negative.     Objective:    Physical Exam Vitals and nursing note reviewed.  Constitutional:      Appearance: Normal appearance.  HENT:     Head: Normocephalic.     Right Ear: Hearing, ear canal and external ear normal. There is impacted cerumen.     Left Ear: Hearing, tympanic membrane, ear canal and external ear normal.     Ears:     Comments: Removal of right cerumen ear impaction TM wnl, pt tolerated procedure well    Mouth/Throat:     Lips: Pink.  Eyes:     General: Lids are normal. Lids are everted, no foreign bodies appreciated. No scleral icterus.    Extraocular Movements: Extraocular movements intact.     Conjunctiva/sclera: Conjunctivae normal.     Pupils: Pupils are equal, round, and reactive to light.  Cardiovascular:     Rate and Rhythm: Normal rate.     Pulses: Normal pulses.  Pulmonary:     Effort: Pulmonary effort is normal.  Abdominal:     General: There is no distension.     Tenderness: There is no guarding.  Musculoskeletal:        General: No swelling.     Cervical back: Normal range of motion.  Skin:    General: Skin is warm.     Coloration: Skin is not cyanotic or jaundiced.          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     Mental Status: He is alert and oriented to person, place, and time.  Psychiatric:        Attention and Perception: Attention normal.        Mood and Affect: Mood and affect normal.        Speech: Speech normal.        Behavior: Behavior normal.        Thought Content: Thought content normal.        Cognition and Memory: Cognition normal.        Judgment: Judgment normal.     BP 140/90 (BP Location: Left Arm, Patient  Position: Sitting, Cuff Size: Normal)   Pulse 84   Temp 97.6 F (36.4 C) (Temporal)   Resp 18   Wt 226 lb 9.6 oz (102.8 kg)   SpO2 96%   BMI 32.51 kg/m  Wt Readings from Last 3 Encounters:  07/13/19 226 lb 9.6 oz (102.8 kg)  03/13/19 226 lb 3.2 oz (102.6 kg)  03/06/19 225 lb 3.2 oz (102.2 kg)     Health Maintenance Due  Topic Date Due  . OPHTHALMOLOGY EXAM  01/24/2015  . FOOT EXAM  06/08/2015  . URINE MICROALBUMIN  03/11/2019    There are no preventive care reminders to display for this patient.  Lab Results  Component Value Date   TSH 0.92 03/10/2018   Lab Results  Component Value Date   WBC 6.2 02/28/2019   HGB 13.9 02/28/2019   HCT 41.0 02/28/2019   MCV 82.1 02/28/2019   PLT 353 02/28/2019   Lab Results  Component Value Date   NA 137 02/28/2019   K 4.4 02/28/2019   CO2 18 (L) 02/28/2019   GLUCOSE 274 (H) 02/28/2019   BUN 25 (H) 02/28/2019   CREATININE 0.70 02/28/2019   BILITOT 0.5 02/27/2019   ALKPHOS 91 05/07/2016  AST 15 02/27/2019   ALT 15 02/27/2019   PROT 7.3 02/27/2019   ALBUMIN 4.3 05/07/2016   CALCIUM 9.4 02/28/2019   ANIONGAP 15 02/28/2019   Lab Results  Component Value Date   CHOL 244 (H) 02/27/2019   Lab Results  Component Value Date   HDL 32 (L) 02/27/2019   Lab Results  Component Value Date   [facility]  02/27/2019     Comment:     . LDL cholesterol not calculated. Triglyceride levels greater than 400 mg/dL invalidate calculated LDL results. . Reference range: <100 . Desirable range <100 mg/dL for primary prevention;   <70 mg/dL for patients with CHD or diabetic patients  with > or = 2 CHD risk factors. Marland Kitchen LDL-C is now calculated using the Martin-Hopkins  calculation, which is a validated novel method providing  better accuracy than the Friedewald equation in the  estimation of LDL-C.  Cresenciano Genre et al. Annamaria Helling. 0355;974(16): 2061-2068  (http://education.[employer].com/faq/FAQ164)    Lab Results  Component Value Date     TRIG 715 (H) 02/27/2019   Lab Results  Component Value Date   CHOLHDL 7.6 (H) 02/27/2019   Lab Results  Component Value Date   HGBA1C >14.0 (H) 02/27/2019      Assessment & Plan:   Problem List Items Addressed This Visit      Endocrine   Uncontrolled type 2 diabetes mellitus with hyperglycemia ([place]) - Primary   Relevant Medications   insulin regular (NOVOLIN R) 100 units/mL injection   insulin detemir (LEVEMIR FLEXTOUCH) 100 UNIT/ML FlexPen   Other Relevant Orders   Hemoglobin A1c   Microalbumin, urine    Other Visit Diagnoses    Need for hepatitis C screening test       Relevant Orders   Hepatitis C antibody   Hearing loss of right ear due to cerumen impaction       Relevant Orders   Ear Lavage   Encounter for colorectal cancer screening       Relevant Orders   Cologuard   Insulin dependent type 2 diabetes mellitus, uncontrolled ([place])       Relevant Medications   insulin regular (NOVOLIN R) 100 units/mL injection   insulin detemir (LEVEMIR FLEXTOUCH) 100 UNIT/ML FlexPen   Skin lump of leg, left        Cerumen impaction ear irrigation completed and tolerated well today in clinic with removal  Skin lump of leg, left: once DM2 is under control goal A1C 7 will consider removal.   Labs today will call next week with results and any changes to treatment plan  Ordered cologuard to be mailed to your home for colorectal cancer screening   Directions:  Seek medical attention for worsening or non resolving symptoms  Drink plenty of water  Continue fast acting sliding scale insulin with fasting blood glucose value just prior to breakfast lunch and dinner  Foot examination at home each night seek medical attention for any skin breakdown or concerns  Get at least 20 minutes of exercise at least 4 times per week  Take all medications as prescribed  Follow a low carbohydrate, low sugar, low cholesterol, low fat diet  A1c, hepatitis C one time screening, urine  Diabetic screen today and Cologuard ordered for colorectal cancer screening  Meds ordered this encounter  Medications  . insulin regular (NOVOLIN R) 100 units/mL injection    Sig: Take blood sugar fasting just prior to breakfast lunch and dinner based on blood sugar give self insulin: Bg  150-200 take 2 units Bg 201-250 take 4 units Bg 251-300 take 6 units Bg 301-350 take 8 units Bg 351-400 take 10 units Bg 401-450 take 12 units and make appointment    Dispense:  10 mL    Refill:  11  . insulin detemir (LEVEMIR FLEXTOUCH) 100 UNIT/ML FlexPen    Sig: 20 units in the AM and 20 units in the PM, SQ    Dispense:  15 mL    Refill:  0    DX Code Needed  .    Follow-up: Return in about 4 months (around 11/13/2019) for cpe with labs one week prior.    Annie Main, FNP

## 2019-07-13 NOTE — Patient Instructions (Signed)
Drink plenty of water  Continue fast acting sliding scale insulin with fasting blood glucose value just prior to breakfast lunch and dinner  Foot examination at home each night seek medical attention for any skin breakdown or concerns  Get at least 20 minutes of exercise at least 4 times per week  Take all medications as prescribed  Follow a low carbohydrate, low sugar, low cholesterol, low fat diet  A1c, hepatitis C one time screening, urine Diabetic screen today and Cologuard ordered for colorectal cancer screening

## 2019-07-13 NOTE — Telephone Encounter (Signed)
Cologuard ordered via [employer].   Order number is (Order [id number])

## 2019-07-14 LAB — HEPATITIS C ANTIBODY
Hepatitis C Ab: NONREACTIVE
SIGNAL TO CUT-OFF: 0.01 (ref <1.00)

## 2019-07-14 LAB — MICROALBUMIN, URINE: Microalb, Ur: 0.5 mg/dL

## 2019-07-14 LAB — HEMOGLOBIN A1C
Hgb A1c MFr Bld: 13.9 % of total Hgb — ABNORMAL HIGH (ref <5.7)
Mean Plasma Glucose: 352 (calc)
eAG (mmol/L): 19.5 (calc)

## 2019-07-18 ENCOUNTER — Telehealth: Payer: Self-pay

## 2019-07-18 ENCOUNTER — Other Ambulatory Visit: Payer: Self-pay | Admitting: Nurse Practitioner

## 2019-07-18 NOTE — Telephone Encounter (Signed)
Pt's wife called and wants to know why pt was prescribed glimizide. She states that the fast acting insulin is not working at all and pt's bs is staying in the 300-400 range. Wife also states the best medication that worked for pt is glimpiride. Please advise.

## 2019-07-19 ENCOUNTER — Other Ambulatory Visit: Payer: Self-pay | Admitting: Nurse Practitioner

## 2019-07-19 NOTE — Telephone Encounter (Signed)
Spoke with pt and he is very understanding of how to do the sliding scale and he states, he will continue to do better. Pt reports that on 07/06, he got his bs down to 150. He will continue his current regiment for the next 3 months until his a1c is rechecked.

## 2019-07-19 NOTE — Progress Notes (Signed)
Added glipizide, pt admitted at apt tat he was not following sliding scale and not using at dinner at all per wife's directions. Plan to recheck in 3 months, direction re-iterated to pt and he is compelled to follow directions with compliance, nutritionist referral placed for diet Dm2.

## 2019-07-19 NOTE — Telephone Encounter (Signed)
Please review the sliding scale with them. He is to take fasting blood sugar just before he eats breakfast lunch and dinner, based on that blood sugar he is to take the fast acting insulin to bring the high blood sugar down. I added the glipizide to help bring the overall blood sugar average down. If we cannot get on a routine then we will not get his high A1C down. This is the goal. Once we get his A1c (goal sliding scale for 3 months) under control then will continue to add the Dm2 type medication in. His prior regimen was not controlling his A1C and that is how we got to where we are now.

## 2019-07-24 ENCOUNTER — Other Ambulatory Visit: Payer: Self-pay | Admitting: Family Medicine

## 2019-07-24 DIAGNOSIS — IMO0002 Reserved for concepts with insufficient information to code with codable children: Secondary | ICD-10-CM

## 2019-08-03 ENCOUNTER — Other Ambulatory Visit: Payer: Self-pay

## 2019-08-03 DIAGNOSIS — IMO0002 Reserved for concepts with insufficient information to code with codable children: Secondary | ICD-10-CM

## 2019-08-03 MED ORDER — METFORMIN HCL 1000 MG PO TABS: 1000.0000 mg | ORAL_TABLET | Freq: Two times a day (BID) | ORAL | 5 refills | Status: DC

## 2019-09-26 DIAGNOSIS — Z20828 Contact with and (suspected) exposure to other viral communicable diseases: Secondary | ICD-10-CM | POA: Diagnosis not present

## 2019-09-28 ENCOUNTER — Telehealth: Payer: Self-pay | Admitting: Family Medicine

## 2019-09-28 DIAGNOSIS — J069 Acute upper respiratory infection, unspecified: Secondary | ICD-10-CM | POA: Diagnosis not present

## 2019-09-28 NOTE — Telephone Encounter (Signed)
Pt wife called,Chad Haynes is positive for covid after having sinus symptoms that were worsening. He was swabbed on Monday, they just received results Wanted to proceed with MAB infusion He has been vaccinated Discussed red flags when to go to ER Pt has DM, obesity risk factors   Message sent to MAB infusion team

## 2019-09-29 ENCOUNTER — Telehealth: Payer: Self-pay | Admitting: Nurse Practitioner

## 2019-09-29 DIAGNOSIS — U071 COVID-19: Secondary | ICD-10-CM

## 2019-09-29 NOTE — Telephone Encounter (Signed)
Called to discuss with Dorian Pod. about Covid symptoms and the use of bamlanivimab, a monoclonal antibody infusion for those with mild to moderate Covid symptoms and at a high risk of hospitalization.     Pt is qualified for this infusion at the [facility] infusion center due to co-morbid conditions and/or a member of an at-risk group, however declines infusion at this time as his symptoms have nearly resolved. He is vaccinated. Symptoms tier reviewed as well as criteria for ending isolation.  Symptoms reviewed that would warrant ED/Hospital evaluation. Preventative practices reviewed. Patient verbalized understanding. Patient advised to call back if he decides that he does want to get infusion. Callback number to the infusion center given. Patient advised to go to Urgent care or ED with severe symptoms.    Patient Active Problem List   Diagnosis Date Noted  . Uncontrolled type 2 diabetes mellitus with hyperglycemia ([place]) 04/27/2018  . Rotator cuff tendinitis, left 01/18/2017  . Erectile dysfunction 05/07/2016  . Non compliance with medical treatment 04/04/2015  . Obesity 04/30/2014  . Low magnesium levels 12/04/2012  . Vitamin B12 deficiency 12/04/2012  . Vitamin D deficiency 09/28/2012  . Depression   . GERD (gastroesophageal reflux disease)   . Hyperlipidemia     Beckey Rutter, DNP, [facility] for [place]  [phone] ([place])

## 2019-10-19 ENCOUNTER — Other Ambulatory Visit: Payer: Self-pay | Admitting: *Deleted

## 2019-10-19 DIAGNOSIS — IMO0002 Reserved for concepts with insufficient information to code with codable children: Secondary | ICD-10-CM

## 2019-10-19 MED ORDER — LEVEMIR FLEXTOUCH 100 UNIT/ML ~~LOC~~ SOPN: PEN_INJECTOR | SUBCUTANEOUS | 0 refills | Status: DC

## 2019-11-01 ENCOUNTER — Other Ambulatory Visit: Payer: Self-pay | Admitting: Family Medicine

## 2019-11-01 DIAGNOSIS — IMO0002 Reserved for concepts with insufficient information to code with codable children: Secondary | ICD-10-CM

## 2019-11-01 DIAGNOSIS — E1165 Type 2 diabetes mellitus with hyperglycemia: Secondary | ICD-10-CM

## 2019-11-04 ENCOUNTER — Telehealth: Payer: Self-pay | Admitting: Family Medicine

## 2019-11-04 NOTE — Telephone Encounter (Signed)
Pharmacy:[facility] [address]  Medication: glipiZIDE (GLUCOTROL) 5 MG tablet   Qty 60  WPV:XYIA 1 tablet (5 mg total) by mouth 2 (two) times daily before a meal

## 2019-11-06 ENCOUNTER — Other Ambulatory Visit: Payer: Self-pay

## 2019-11-06 DIAGNOSIS — E1165 Type 2 diabetes mellitus with hyperglycemia: Secondary | ICD-10-CM

## 2019-11-06 MED ORDER — GLIPIZIDE 5 MG PO TABS: 5.0000 mg | ORAL_TABLET | Freq: Two times a day (BID) | ORAL | 3 refills | Status: DC

## 2019-11-13 ENCOUNTER — Ambulatory Visit: Payer: BC Managed Care – PPO | Admitting: Nurse Practitioner

## 2020-02-20 ENCOUNTER — Other Ambulatory Visit: Payer: Self-pay | Admitting: *Deleted

## 2020-02-20 MED ORDER — LANCETS MISC: 11 refills | Status: DC

## 2020-02-22 ENCOUNTER — Other Ambulatory Visit: Payer: Self-pay | Admitting: *Deleted

## 2020-02-22 DIAGNOSIS — E1165 Type 2 diabetes mellitus with hyperglycemia: Secondary | ICD-10-CM

## 2020-02-22 MED ORDER — BD PEN NEEDLE NANO 2ND GEN 32G X 4 MM MISC: 1 refills | Status: DC

## 2020-03-11 ENCOUNTER — Other Ambulatory Visit: Payer: Self-pay | Admitting: *Deleted

## 2020-03-11 MED ORDER — FREESTYLE LIBRE 14 DAY SENSOR MISC: 3 refills | Status: DC

## 2020-03-15 ENCOUNTER — Other Ambulatory Visit: Payer: Self-pay

## 2020-03-15 ENCOUNTER — Ambulatory Visit (INDEPENDENT_AMBULATORY_CARE_PROVIDER_SITE_OTHER): Payer: BC Managed Care – PPO | Admitting: Family Medicine

## 2020-03-15 ENCOUNTER — Encounter: Payer: Self-pay | Admitting: Family Medicine

## 2020-03-15 VITALS — BP 144/78 | HR 106 | Temp 98.2°F | Resp 18 | Ht 70.0 in | Wt 243.0 lb

## 2020-03-15 DIAGNOSIS — H9193 Unspecified hearing loss, bilateral: Secondary | ICD-10-CM

## 2020-03-15 DIAGNOSIS — E559 Vitamin D deficiency, unspecified: Secondary | ICD-10-CM

## 2020-03-15 DIAGNOSIS — E785 Hyperlipidemia, unspecified: Secondary | ICD-10-CM

## 2020-03-15 DIAGNOSIS — Z125 Encounter for screening for malignant neoplasm of prostate: Secondary | ICD-10-CM | POA: Diagnosis not present

## 2020-03-15 DIAGNOSIS — R9431 Abnormal electrocardiogram [ECG] [EKG]: Secondary | ICD-10-CM

## 2020-03-15 DIAGNOSIS — E538 Deficiency of other specified B group vitamins: Secondary | ICD-10-CM | POA: Diagnosis not present

## 2020-03-15 DIAGNOSIS — R0600 Dyspnea, unspecified: Secondary | ICD-10-CM

## 2020-03-15 DIAGNOSIS — E1165 Type 2 diabetes mellitus with hyperglycemia: Secondary | ICD-10-CM

## 2020-03-15 DIAGNOSIS — K219 Gastro-esophageal reflux disease without esophagitis: Secondary | ICD-10-CM

## 2020-03-15 DIAGNOSIS — D2321 Other benign neoplasm of skin of right ear and external auricular canal: Secondary | ICD-10-CM

## 2020-03-15 DIAGNOSIS — R0609 Other forms of dyspnea: Secondary | ICD-10-CM

## 2020-03-15 DIAGNOSIS — H6121 Impacted cerumen, right ear: Secondary | ICD-10-CM

## 2020-03-15 DIAGNOSIS — I10 Essential (primary) hypertension: Secondary | ICD-10-CM

## 2020-03-15 DIAGNOSIS — J322 Chronic ethmoidal sinusitis: Secondary | ICD-10-CM

## 2020-03-15 DIAGNOSIS — R2242 Localized swelling, mass and lump, left lower limb: Secondary | ICD-10-CM

## 2020-03-15 MED ORDER — LISINOPRIL 10 MG PO TABS: 10.0000 mg | ORAL_TABLET | Freq: Every day | ORAL | 3 refills | Status: DC

## 2020-03-15 NOTE — Patient Instructions (Addendum)
Referral to dermatology for mass removal Referral to endocrinology for diabetes  Referral to ENT for hearing loss Referral to cardiology for the breathing issues/sress test  Start lisinopril 10mg  once a day for blood pressure  Take the cholesterol medication at bedtime (Simvastatin)- it works better  F/u 3 MONTHS Dr. Dennard Schaumann

## 2020-03-15 NOTE — Progress Notes (Signed)
Subjective:    Patient ID: Chad Pod., male    DOB: 1965/07/25, 55 y.o.   MRN: [id number]  Patient presents for Follow-up (Is not fasting/), SOB (States that he has SOB on exertion and fatigue), Ear Issues, and Abscess to L Thigh (Not draining)  Patient here to follow-up chronic medical problems.  Medications reviewed  At his last visit was with our nurse practitioner back July 2021. Diabetes mellitus type 2 the last visit glipizide was added.  Per the notes he was not following his sliding scale insulin for his meals.  Was also referred to a nutritionist.  Per notes overall noncompliant Last A1c was 13.9% prior to that greater than 14 Takes Levemir 20 units twice a day  He often misses the novolog- states he is not home to take it with meals  He is taking Glipizide and metformin twice a day  He has a continous glucose monitor This AM CBG was 250 He is using the [facility]   He has had some SOB and fatigue with exertion now noticing with his regular activity he is getting winded, started ASA 81mg   Paternal GF, had heart attack, he was a smoker    Depression he has been on Paxil 20 mg  Hyperlipidemia he takes simvastatin 80 mg his last lipid panel was in February 2021 his triglycerides were 715 at that time LDL cannot be calculated He takes   Hypertension he was  on Bystolic 2.5 mg once a day he is not on ACE inhibitor or ARB  Right ear gives him some conern, he gets a little dizzy ENT, he has issues with his ears a lot And decreased hearing  chroni chearing issues    Hasa spot on left high for  > 1 year, initially was a larger lesion, but wont heal all the way    Review Of Systems:  GEN- denies fatigue, fever, weight loss,weakness, recent illness HEENT- denies eye drainage, change in vision, nasal discharge, CVS- denies chest pain, palpitations RESP-+SOB, denies cough, wheeze ABD- denies N/V, change in stools, abd pain GU- denies dysuria,  hematuria, dribbling, incontinence MSK- denies joint pain, muscle aches, injury Neuro- denies headache, dizziness, syncope, seizure activity       Objective:    BP (!) 144/78 (BP Location: Right Arm, Patient Position: Sitting, Cuff Size: Normal)   Pulse (!) 106   Temp 98.2 F (36.8 C) (Temporal)   Resp 18   Ht 5\' 10"  (1.778 m)   Wt 243 lb (110.2 kg)   SpO2 98%   BMI 34.87 kg/m  GEN- NAD, alert and oriented x3 HEENT- PERRL, EOMI, non injected sclera, pink conjunctiva, MMM, oropharynx clear  Impacted cerumen right canal, left TM clear in tact s/p wax removal, growth noted in right canal , TM in tact  Neck- Supple, no thyromegaly , no bruit  CVS- RRR, no murmur RESP-CTAB ABD-NABS,soft,NT,ND EXT- No edema Pulses- Radial, DP- 2+   EKG NSR, q waves (? Old infarct)      Assessment & Plan:      Problem List Items Addressed This Visit      Unprioritized   GERD (gastroesophageal reflux disease) - Primary   Hyperlipidemia    On statin drug       Relevant Medications   lisinopril (ZESTRIL) 10 MG tablet   Other Relevant Orders   LDL Cholesterol, Direct (Completed)   Hypertension    Uncontrolled, restart lisinopil 10mg  once a day  I can not find why this was stopped He denies any SE or       Relevant Medications   lisinopril (ZESTRIL) 10 MG tablet   Uncontrolled type 2 diabetes mellitus with hyperglycemia ([facility])    Uncontrolled, complance with therapy is an issue Has been uncontrolled for years Will refer to endocrinology Check labs       Relevant Medications   lisinopril (ZESTRIL) 10 MG tablet   Other Relevant Orders   Ambulatory referral to Endocrinology   CBC with Differential/Platelet (Completed)   Hemoglobin A1c (Completed)   Basic metabolic panel (Completed)   Vitamin B12 deficiency   Relevant Orders   Vitamin B12 (Completed)   Vitamin D deficiency   Relevant Orders   Vitamin D, 25-hydroxy (Completed)    Other Visit Diagnoses    DOE (dyspnea on  exertion)       Multiple risk factors for CAD, needs stress testing, referral to cardiology ,also abnormal EKG   Relevant Orders   EKG 12-Lead (Completed)   Ambulatory referral to Cardiology   Impacted cerumen of right ear       Bilateral hearing loss, unspecified hearing loss type       Relevant Orders   Ambulatory referral to ENT   Subcutaneous mass of left lower extremity       Relevant Orders   Ambulatory referral to Dermatology   Prostate cancer screening       Relevant Orders   PSA (Completed)   Chronic ethmoidal sinusitis       Relevant Orders   Ambulatory referral to ENT   Benign skin growth of ear, right       Referral to ent FOR HEARING and growth    Relevant Orders   Ambulatory referral to ENT   Abnormal EKG       Relevant Orders   Ambulatory referral to Cardiology      Note: This dictation was prepared with Dragon dictation along with smaller phrase technology. Any transcriptional errors that result from this process are unintentional.

## 2020-03-16 LAB — CBC WITH DIFFERENTIAL/PLATELET
Absolute Monocytes: 423 cells/uL (ref 200–950)
Basophils Absolute: 59 cells/uL (ref 0–200)
Basophils Relative: 0.9 %
Eosinophils Absolute: 202 cells/uL (ref 15–500)
Eosinophils Relative: 3.1 %
HCT: 42.5 % (ref 38.5–50.0)
Hemoglobin: 14.3 g/dL (ref 13.2–17.1)
Lymphs Abs: 2067 cells/uL (ref 850–3900)
MCH: 27.3 pg (ref 27.0–33.0)
MCHC: 33.6 g/dL (ref 32.0–36.0)
MCV: 81.3 fL (ref 80.0–100.0)
MPV: 9.9 fL (ref 7.5–12.5)
Monocytes Relative: 6.5 %
Neutro Abs: 3751 cells/uL (ref 1500–7800)
Neutrophils Relative %: 57.7 %
Platelets: 352 10*3/uL (ref 140–400)
RBC: 5.23 10*6/uL (ref 4.20–5.80)
RDW: 14.3 % (ref 11.0–15.0)
Total Lymphocyte: 31.8 %
WBC: 6.5 10*3/uL (ref 3.8–10.8)

## 2020-03-16 LAB — BASIC METABOLIC PANEL
BUN: 13 mg/dL (ref 7–25)
CO2: 22 mmol/L (ref 20–32)
Calcium: 9.4 mg/dL (ref 8.6–10.3)
Chloride: 105 mmol/L (ref 98–110)
Creat: 0.81 mg/dL (ref 0.70–1.33)
Glucose, Bld: 283 mg/dL — ABNORMAL HIGH (ref 65–99)
Potassium: 4.4 mmol/L (ref 3.5–5.3)
Sodium: 140 mmol/L (ref 135–146)

## 2020-03-16 LAB — VITAMIN B12: Vitamin B-12: 412 pg/mL (ref 200–1100)

## 2020-03-16 LAB — PSA: PSA: 0.93 ng/mL (ref <=4.0)

## 2020-03-16 LAB — HEMOGLOBIN A1C
Hgb A1c MFr Bld: 12.8 % of total Hgb — ABNORMAL HIGH (ref <5.7)
Mean Plasma Glucose: 321 mg/dL
eAG (mmol/L): 17.8 mmol/L

## 2020-03-16 LAB — VITAMIN D 25 HYDROXY (VIT D DEFICIENCY, FRACTURES): Vit D, 25-Hydroxy: 25 ng/mL — ABNORMAL LOW (ref 30–100)

## 2020-03-16 LAB — LDL CHOLESTEROL, DIRECT: Direct LDL: 68 mg/dL (ref <100)

## 2020-03-17 NOTE — Assessment & Plan Note (Signed)
Uncontrolled, restart lisinopil 10mg  once a day  I can not find why this was stopped He denies any SE or

## 2020-03-18 ENCOUNTER — Encounter: Payer: Self-pay | Admitting: Family Medicine

## 2020-03-18 ENCOUNTER — Other Ambulatory Visit: Payer: Self-pay | Admitting: Family Medicine

## 2020-03-18 DIAGNOSIS — E1165 Type 2 diabetes mellitus with hyperglycemia: Secondary | ICD-10-CM

## 2020-03-18 NOTE — Assessment & Plan Note (Signed)
On statin drug 

## 2020-03-18 NOTE — Assessment & Plan Note (Signed)
Uncontrolled, complance with therapy is an issue Has been uncontrolled for years Will refer to endocrinology Check labs

## 2020-03-25 ENCOUNTER — Encounter: Payer: Self-pay | Admitting: Nurse Practitioner

## 2020-03-25 ENCOUNTER — Ambulatory Visit: Payer: BC Managed Care – PPO | Admitting: Nurse Practitioner

## 2020-03-25 ENCOUNTER — Other Ambulatory Visit: Payer: Self-pay

## 2020-03-25 ENCOUNTER — Ambulatory Visit (INDEPENDENT_AMBULATORY_CARE_PROVIDER_SITE_OTHER): Payer: BC Managed Care – PPO | Admitting: Nurse Practitioner

## 2020-03-25 VITALS — BP 146/84 | HR 102 | Ht 70.0 in | Wt 247.0 lb

## 2020-03-25 DIAGNOSIS — Z794 Long term (current) use of insulin: Secondary | ICD-10-CM

## 2020-03-25 DIAGNOSIS — I10 Essential (primary) hypertension: Secondary | ICD-10-CM

## 2020-03-25 DIAGNOSIS — E1165 Type 2 diabetes mellitus with hyperglycemia: Secondary | ICD-10-CM | POA: Diagnosis not present

## 2020-03-25 DIAGNOSIS — IMO0002 Reserved for concepts with insufficient information to code with codable children: Secondary | ICD-10-CM

## 2020-03-25 DIAGNOSIS — E782 Mixed hyperlipidemia: Secondary | ICD-10-CM

## 2020-03-25 MED ORDER — LEVEMIR FLEXTOUCH 100 UNIT/ML ~~LOC~~ SOPN: 40.0000 [IU] | PEN_INJECTOR | Freq: Every day | SUBCUTANEOUS | 4 refills | Status: DC

## 2020-03-25 MED ORDER — INSULIN REGULAR HUMAN 100 UNIT/ML IJ SOLN: 5.0000 [IU] | Freq: Three times a day (TID) | INTRAMUSCULAR | 11 refills | Status: DC

## 2020-03-25 NOTE — Progress Notes (Signed)
Endocrinology Consult Note       03/25/2020, 2:22 PM   Subjective:    Patient ID: Chad Pod., male    DOB: 10-Feb-1965.  Chad Pod. is being seen in consultation for management of currently uncontrolled symptomatic diabetes requested by  Susy Frizzle, MD.   Past Medical History:  Diagnosis Date  . Depression   . Diabetes mellitus without complication ([place])   . Diabetes mellitus, type II ([place])   . GERD (gastroesophageal reflux disease)   . H. pylori infection   . HTN (hypertension)   . Hyperlipidemia   . Hypogonadism male   . Leg pain   . Low magnesium levels   . Neck pain   . Rhinitis, allergic   . Vitamin B12 deficiency   . Vitamin D deficiency   . White coat hypertension     Past Surgical History:  Procedure Laterality Date  . KNEE ARTHROSCOPY Right     Social History   Socioeconomic History  . Marital status: Married    Spouse name: Not on file  . Number of children: Not on file  . Years of education: Not on file  . Highest education level: Not on file  Occupational History  . Not on file  Tobacco Use  . Smoking status: Former Smoker    Quit date: 06/08/1991    Years since quitting: 28.8  . Smokeless tobacco: Never Used  Substance and Sexual Activity  . Alcohol use: Yes    Alcohol/week: 0.0 standard drinks    Comment: occasionally  . Drug use: No  . Sexual activity: Yes  Other Topics Concern  . Not on file  Social History Narrative   Married. No children. Does not formal exercise.   In school at [employer] full-time. Works part-time job at night.   "Has to walk a mile from the car to classic [employer]"   Social Determinants of Health   Financial Resource Strain: Not on file  Food Insecurity: Not on file  Transportation Needs: Not on file  Physical Activity: Not on file  Stress: Not on file  Social Connections: Not on file    Family History  Problem Relation Age of  Onset  . COPD Mother        smoker  . Hypertension Father     Outpatient Encounter Medications as of 03/25/2020  Medication Sig  . aspirin 81 MG tablet Take 1 tablet (81 mg total) by mouth daily.  . BD VEO INSULIN SYRINGE U/F 31G X 15/64" 0.3 ML MISC USE WITH SLIDING SCALE INSULIN  . Continuous Blood Gluc Sensor (FREESTYLE LIBRE 14 DAY SENSOR) MISC Use as directed to monitor glucose continuously. Change sensor Q 14 days. Dx: E11.65.  Marland Kitchen glipiZIDE (GLUCOTROL) 5 MG tablet TAKE 1 TABLET BY MOUTH TWICE A DAY BEFORE A MEAL  . Insulin Pen Needle (BD PEN NEEDLE NANO 2ND GEN) 32G X 4 MM MISC USE AS DIRECTED WITH INSULIN  . lisinopril (ZESTRIL) 10 MG tablet Take 1 tablet (10 mg total) by mouth daily.  . metFORMIN (GLUCOPHAGE) 1000 MG tablet Take 1 tablet (1,000 mg total) by mouth 2 (two) times daily. TAKE 1 TABLET BY MOUTH TWICE A  DAY WITH A MEAL  . omeprazole (PRILOSEC) 40 MG capsule Take 1 capsule (40 mg total) by mouth daily.  Marland Kitchen PARoxetine (PAXIL) 20 MG tablet Take 1 tablet (20 mg total) by mouth daily.  . simvastatin (ZOCOR) 80 MG tablet Take 1 tablet (80 mg total) by mouth daily at 6 PM. TAKE 1 TABLET BY MOUTH EVERY DAY  . [DISCONTINUED] insulin detemir (LEVEMIR FLEXTOUCH) 100 UNIT/ML FlexPen INJECT 20 UNITS IN THE MORNING AND 20 UNITS IN THE EVENING, SUB Q  . [DISCONTINUED] insulin regular (NOVOLIN R) 100 units/mL injection Take blood sugar fasting just prior to breakfast lunch and dinner based on blood sugar give self insulin: Bg 150-200 take 2 units Bg 201-250 take 4 units Bg 251-300 take 6 units Bg 301-350 take 8 units Bg 351-400 take 10 units Bg 401-450 take 12 units and make appointment  . Blood Glucose Monitoring Suppl (BLOOD GLUCOSE SYSTEM PAK) KIT Please dispense based on patient and insurance preference. Use as directed to monitor FSBS 2x daily. Dx: E11.9 (Patient not taking: Reported on 03/25/2020)  . Glucose Blood (BLOOD GLUCOSE TEST STRIPS) STRP Please dispense based on patient and  insurance preference. Use as directed to monitor blood sugar 2-3 x a day. Dx: E11.65, Z79.4 (Patient not taking: Reported on 03/25/2020)  . insulin detemir (LEVEMIR FLEXTOUCH) 100 UNIT/ML FlexPen Inject 40 Units into the skin at bedtime.  . insulin regular (NOVOLIN R) 100 units/mL injection Inject 0.05-0.11 mLs (5-11 Units total) into the skin 3 (three) times daily before meals.  . Lancets MISC Please dispense based on patient and insurance preference. Use as directed to monitor FSBS 3x daily. Dx: E11.65. (Patient not taking: Reported on 03/25/2020)   No facility-administered encounter medications on file as of 03/25/2020.    ALLERGIES: No Known Allergies  VACCINATION STATUS: Immunization History  Administered Date(s) Administered  . PFIZER(Purple Top)SARS-COV-2 Vaccination 04/13/2019, 05/13/2019, 12/17/2019  . Pneumococcal Polysaccharide-23 01/17/2014  . Tdap 05/07/2016    Diabetes He presents for his initial diabetic visit. He has type 2 diabetes mellitus. Onset time: was diagnosed at approx age of 55. His disease course has been improving. There are no hypoglycemic associated symptoms. Associated symptoms include fatigue and polydipsia. Pertinent negatives for diabetes include no polyuria. There are no hypoglycemic complications. Symptoms are stable. Diabetic complications include impotence. 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 Current diabetic treatment includes intensive insulin program and oral agent (dual therapy). He is compliant with treatment most of the time. His weight is increasing steadily. He is following a generally unhealthy diet. When asked about meal planning, he reported none. He has had a previous visit with a dietitian (Did not find it beneficial). He rarely  participates in exercise. His breakfast blood glucose range is generally >200 mg/dl. His lunch blood glucose range is generally >200 mg/dl. His dinner blood glucose range is generally >200 mg/dl. His bedtime blood glucose range is generally >200 mg/dl. His overall blood glucose range is >200 mg/dl. (He presents today for his consultation, with his CGM on his phone, showing gross hyperglycemia overall.  His most recent A1c was 12.8% on 03/15/20.  Analysis of his CGM shows TIR 28%, TAR 72%, TBR 0%.  He reports he does not use his CGM like he should.  He endorses drinking mostly diet soda and flavored waters throughout the day.  He also admits to  using artificial sweeteners in his meals.  He does tend to skip meals or replace them with diabetic candy.  He does not like much meats.  He does not get any routine physical exercise outside of his work.  He denies any significant hypoglycemia.) An ACE inhibitor/angiotensin II receptor blocker is being taken. He does not see a podiatrist.Eye exam is current.  Hyperlipidemia This is a chronic problem. The current episode started more than 1 year ago. The problem is controlled. Recent lipid tests were reviewed and are normal. Exacerbating diseases include diabetes and obesity. Factors aggravating his hyperlipidemia include fatty foods. Current antihyperlipidemic treatment includes statins. The current treatment provides moderate improvement of lipids. Compliance problems include adherence to diet and adherence to exercise.  Risk factors for coronary artery disease include diabetes mellitus, dyslipidemia, hypertension, male sex, obesity and a sedentary lifestyle.  Hypertension This is a chronic problem. The current episode started more than 1 year ago. The problem is unchanged. The problem is uncontrolled. There are no associated agents to hypertension. Risk factors for coronary artery disease include diabetes mellitus, dyslipidemia, family history, male gender, obesity and  sedentary lifestyle. Past treatments include ACE inhibitors. The current treatment provides mild improvement. Compliance problems include diet and exercise.      Review of systems  Constitutional: + Minimally fluctuating body weight,  current Body mass index is 35.44 kg/m. , + fatigue, no subjective hyperthermia, no subjective hypothermia Eyes: + blurry vision, no xerophthalmia ENT: no sore throat, no nodules palpated in throat, no dysphagia/odynophagia, no hoarseness Cardiovascular: no chest pain, no shortness of breath, no palpitations, no leg swelling Respiratory: no cough, no shortness of breath Gastrointestinal: no nausea/vomiting/diarrhea Musculoskeletal: no muscle/joint aches Skin: no rashes, no hyperemia Neurological: no tremors, no numbness, no tingling, no dizziness Psychiatric: no depression, no anxiety  Objective:     BP (!) 146/84 (BP Location: Left Arm, Patient Position: Sitting)   Pulse (!) 102   Ht '5\' 10"'  (1.778 m)   Wt 247 lb (112 kg)   BMI 35.44 kg/m   Wt Readings from Last 3 Encounters:  03/25/20 247 lb (112 kg)  03/15/20 243 lb (110.2 kg)  07/13/19 226 lb 9.6 oz (102.8 kg)     BP Readings from Last 3 Encounters:  03/25/20 (!) 146/84  03/15/20 (!) 144/78  07/13/19 140/90     Physical Exam- Limited  Constitutional:  Body mass index is 35.44 kg/m. , not in acute distress, mildly anxious state of mind Eyes:  EOMI, no exophthalmos Neck: Supple Cardiovascular: RRR, no murmers, rubs, or gallops, no edema Respiratory: Adequate breathing efforts, no crackles, rales, rhonchi, or wheezing Musculoskeletal: no gross deformities, strength intact in all four extremities, no gross restriction of joint movements Skin:  no rashes, no hyperemia Neurological: no tremor with outstretched hands    CMP ( most recent) CMP     Component Value Date/Time   NA 140 03/15/2020 1159   K 4.4 03/15/2020 1159   CL 105 03/15/2020 1159   CO2 22 03/15/2020 1159   GLUCOSE  283 (H) 03/15/2020 1159   BUN 13 03/15/2020 1159   CREATININE 0.81 03/15/2020 1159   CALCIUM 9.4 03/15/2020 1159   PROT 7.3 02/27/2019 1602   ALBUMIN 4.3 05/07/2016 0940   AST 15 02/27/2019 1602   ALT 15 02/27/2019 1602   ALKPHOS 91 05/07/2016 0940   BILITOT 0.5 02/27/2019 1602   GFRNONAA >60 02/28/2019 0927   GFRNONAA 75 02/27/2019 1602   GFRAA >60 02/28/2019 6468  GFRAA 87 02/27/2019 1602     Diabetic Labs (most recent): Lab Results  Component Value Date   HGBA1C 12.8 (H) 03/15/2020   HGBA1C 13.9 (H) 07/13/2019   HGBA1C >14.0 (H) 02/27/2019     Lipid Panel ( most recent) Lipid Panel     Component Value Date/Time   CHOL 244 (H) 02/27/2019 1602   TRIG 715 (H) 02/27/2019 1602   HDL 32 (L) 02/27/2019 1602   CHOLHDL 7.6 (H) 02/27/2019 1602   VLDL 38 (H) 05/07/2016 0940   LDLCALC  02/27/2019 1602     Comment:     . LDL cholesterol not calculated. Triglyceride levels greater than 400 mg/dL invalidate calculated LDL results. . Reference range: <100 . Desirable range <100 mg/dL for primary prevention;   <70 mg/dL for patients with CHD or diabetic patients  with > or = 2 CHD risk factors. Marland Kitchen LDL-C is now calculated using the Martin-Hopkins  calculation, which is a validated novel method providing  better accuracy than the Friedewald equation in the  estimation of LDL-C.  Cresenciano Genre et al. Annamaria Helling. 7564;332(95): 2061-2068  (http://education.[facility].com/faq/FAQ164)    LDLDIRECT 68 03/15/2020 1159      Lab Results  Component Value Date   TSH 0.92 03/10/2018   TSH 0.69 05/07/2016           Assessment & Plan:   1) Uncontrolled type 2 diabetes mellitus with hyperglycemia ([facility])  - Chad Pod. has currently uncontrolled symptomatic type 2 DM since 55 years of age, with most recent A1c of 12.8 %.   He presents today for his consultation, with his CGM on his phone, showing gross hyperglycemia overall.  His most recent A1c was 12.8% on 03/15/20.  Analysis  of his CGM shows TIR 28%, TAR 72%, TBR 0%.  He reports he does not use his CGM like he should.  He endorses drinking mostly diet soda and flavored waters throughout the day.  He also admits to using artificial sweeteners in his meals.  He does tend to skip meals or replace them with diabetic candy.  He does not like much meats.  He does not get any routine physical exercise outside of his work.  He denies any significant hypoglycemia.  -Recent labs reviewed.  - I had a long discussion with him about the progressive nature of diabetes and the pathology behind its complications. -his diabetes is not currently complicated but he remains at a high risk for more acute and chronic complications which include CAD, CVA, CKD, retinopathy, and neuropathy. These are all discussed in detail with him.  - I have counseled him on diet  and weight management by adopting a carbohydrate restricted/protein rich diet. Patient is encouraged to switch to unprocessed or minimally processed complex starch and increased protein intake (animal or plant source), fruits, and vegetables. -  he is advised to stick to a routine mealtimes to eat 3 meals a day and avoid unnecessary snacks (to snack only to correct hypoglycemia).   - he acknowledges that there is a room for improvement in his food and drink choices. - Suggestion is made for him to avoid simple carbohydrates  from his diet including Cakes, Sweet Desserts, Ice Cream, Soda (diet and regular), Sweet Tea, Candies, Chips, Cookies, Store Bought Juices, Alcohol in Excess of  1-2 drinks a day, Artificial Sweeteners, Coffee Creamer, and "Sugar-free" Products. This will help patient to have more stable blood glucose profile and potentially avoid unintended weight gain.  - I have approached him  with the following individualized plan to manage  his diabetes and patient agrees:   -He is advised to change how he takes his Levemir to 40 units SQ nightly (instead of split doses) and  change his Novolin R 5-11 units TID with meals if glucose above 90 and he is eating.  Specific instructions on how to titrate insulin dose based on glucose readings given to patient in writing.  -he is encouraged to use his CGM to start consistently monitoring glucose 4 times daily, before meals and before bed, to log their readings on the clinic sheets provided, and bring them to review at follow up appointment in 2 weeks.  - he is warned not to take insulin without proper monitoring per orders. - Adjustment parameters are given to him for hypo and hyperglycemia in writing. - he is encouraged to call clinic for blood glucose levels less than 70 or above 300 mg /dl. - he is advised to continue Metformin 1000 mg po twice daily and Glipizide 5 mg po twice daily, therapeutically suitable for patient .  - he will be considered for incretin therapy as appropriate next visit.  - Specific targets for  A1c;  LDL, HDL,  and Triglycerides were discussed with the patient.  2) Blood Pressure /Hypertension:  his blood pressure is not controlled to target. There is documentation of white coat syndrome.  he is advised to continue his current medications including Lisinopril 10 mg p.o. daily with breakfast.  3) Lipids/Hyperlipidemia:    Review of his recent lipid panel from 03/15/20 showed controlled  LDL at 68 .  he  is advised to continue Simvastatin 80 mg daily at bedtime.  Side effects and precautions discussed with him.  4)  Weight/Diet:  his Body mass index is 35.44 kg/m.  -  clearly complicating his diabetes care.   he is  candidate for weight loss. I discussed with him the fact that loss of 5 - 10% of his  current body weight will have the most impact on his diabetes management.  Exercise, and detailed carbohydrates information provided  -  detailed on discharge instructions.  5) Chronic Care/Health Maintenance: -he is on ACEI/ARB and Statin medications and is encouraged to initiate and continue to  follow up with Ophthalmology, Dentist, Podiatrist at least yearly or according to recommendations, and advised to stay away from smoking. I have recommended yearly flu vaccine and pneumonia vaccine at least every 5 years; moderate intensity exercise for up to 150 minutes weekly; and sleep for at least 7 hours a day.  - he is advised to maintain close follow up with Susy Frizzle, MD for primary care needs, as well as his other providers for optimal and coordinated care.   - Time spent in this patient care: 60 min, of which > 50% was spent in counseling him about his diabetes and the rest reviewing his blood glucose logs, discussing his hypoglycemia and hyperglycemia episodes, reviewing his current and previous labs/studies (including abstraction from other facilities) and medications doses and developing a long term treatment plan based on the latest standards of care/guidelines; and documenting his care.    Please refer to Patient Instructions for Blood Glucose Monitoring and Insulin/Medications Dosing Guide" in media tab for additional information. Please also refer to "Patient Self Inventory" in the Media tab for reviewed elements of pertinent patient history.  Chad Pod. participated in the discussions, expressed understanding, and voiced agreement with the above plans.  All questions were answered to his  satisfaction. he is encouraged to contact clinic should he have any questions or concerns prior to his return visit.   Follow up plan: - Return in about 2 weeks (around 04/08/2020) for Diabetes follow up, ABI next visit, Bring glucometer and logs.  Rayetta Pigg, [facility] [address] [place], [place] [address] Phone: [phone] Fax: [phone]  03/25/2020, 2:22 PM

## 2020-03-25 NOTE — Patient Instructions (Signed)
Diabetes Mellitus and Nutrition, Adult When you have diabetes, or diabetes mellitus, it is very important to have healthy eating habits because your blood sugar (glucose) levels are greatly affected by what you eat and drink. Eating healthy foods in the right amounts, at about the same times every day, can help you:  Control your blood glucose.  Lower your risk of heart disease.  Improve your blood pressure.  Reach or maintain a healthy weight. What can affect my meal plan? Every person with diabetes is different, and each person has different needs for a meal plan. Your health care provider may recommend that you work with a dietitian to make a meal plan that is best for you. Your meal plan may vary depending on factors such as:  The calories you need.  The medicines you take.  Your weight.  Your blood glucose, blood pressure, and cholesterol levels.  Your activity level.  Other health conditions you have, such as heart or kidney disease. How do carbohydrates affect me? Carbohydrates, also called carbs, affect your blood glucose level more than any other type of food. Eating carbs naturally raises the amount of glucose in your blood. Carb counting is a method for keeping track of how many carbs you eat. Counting carbs is important to keep your blood glucose at a healthy level, especially if you use insulin or take certain oral diabetes medicines. It is important to know how many carbs you can safely have in each meal. This is different for every person. Your dietitian can help you calculate how many carbs you should have at each meal and for each snack. How does alcohol affect me? Alcohol can cause a sudden decrease in blood glucose (hypoglycemia), especially if you use insulin or take certain oral diabetes medicines. Hypoglycemia can be a life-threatening condition. Symptoms of hypoglycemia, such as sleepiness, dizziness, and confusion, are similar to symptoms of having too much  alcohol.  Do not drink alcohol if: ? Your health care provider tells you not to drink. ? You are pregnant, may be pregnant, or are planning to become pregnant.  If you drink alcohol: ? Do not drink on an empty stomach. ? Limit how much you use to:  0-1 drink a day for women.  0-2 drinks a day for men. ? Be aware of how much alcohol is in your drink. In the [place] one drink equals one 12 oz bottle of beer (355 mL), one 5 oz glass of wine (148 mL), or one 1 oz glass of hard liquor (44 mL). ? Keep yourself hydrated with water, diet soda, or unsweetened iced tea.  Keep in mind that regular soda, juice, and other mixers may contain a lot of sugar and must be counted as carbs. What are tips for following this plan? Reading food labels  Start by checking the serving size on the "Nutrition Facts" label of packaged foods and drinks. The amount of calories, carbs, fats, and other nutrients listed on the label is based on one serving of the item. Many items contain more than one serving per package.  Check the total grams (g) of carbs in one serving. You can calculate the number of servings of carbs in one serving by dividing the total carbs by 15. For example, if a food has 30 g of total carbs per serving, it would be equal to 2 servings of carbs.  Check the number of grams (g) of saturated fats and trans fats in one serving. Choose foods that have   a low amount or none of these fats.  Check the number of milligrams (mg) of salt (sodium) in one serving. Most people should limit total sodium intake to less than 2,300 mg per day.  Always check the nutrition information of foods labeled as "low-fat" or "nonfat." These foods may be higher in added sugar or refined carbs and should be avoided.  Talk to your dietitian to identify your daily goals for nutrients listed on the label. Shopping  Avoid buying canned, pre-made, or processed foods. These foods tend to be high in fat, sodium, and added  sugar.  Shop around the outside edge of the grocery store. This is where you will most often find fresh fruits and vegetables, bulk grains, fresh meats, and fresh dairy. Cooking  Use low-heat cooking methods, such as baking, instead of high-heat cooking methods like deep frying.  Cook using healthy oils, such as olive, canola, or sunflower oil.  Avoid cooking with butter, cream, or high-fat meats. Meal planning  Eat meals and snacks regularly, preferably at the same times every day. Avoid going long periods of time without eating.  Eat foods that are high in fiber, such as fresh fruits, vegetables, beans, and whole grains. Talk with your dietitian about how many servings of carbs you can eat at each meal.  Eat 4-6 oz (112-168 g) of lean protein each day, such as lean meat, chicken, fish, eggs, or tofu. One ounce (oz) of lean protein is equal to: ? 1 oz (28 g) of meat, chicken, or fish. ? 1 egg. ?  cup (62 g) of tofu.  Eat some foods each day that contain healthy fats, such as avocado, nuts, seeds, and fish.   What foods should I eat? Fruits Berries. Apples. Oranges. Peaches. Apricots. Plums. Grapes. Mango. Papaya. Pomegranate. Kiwi. Cherries. Vegetables Lettuce. Spinach. Leafy greens, including kale, chard, collard greens, and mustard greens. Beets. Cauliflower. Cabbage. Broccoli. Carrots. Green beans. Tomatoes. Peppers. Onions. Cucumbers. Brussels sprouts. Grains Whole grains, such as whole-wheat or whole-grain bread, crackers, tortillas, cereal, and pasta. Unsweetened oatmeal. Quinoa. Brown or wild rice. Meats and other proteins Seafood. Poultry without skin. Lean cuts of poultry and beef. Tofu. Nuts. Seeds. Dairy Low-fat or fat-free dairy products such as milk, yogurt, and cheese. The items listed above may not be a complete list of foods and beverages you can eat. Contact a dietitian for more information. What foods should I avoid? Fruits Fruits canned with  syrup. Vegetables Canned vegetables. Frozen vegetables with butter or cream sauce. Grains Refined white flour and flour products such as bread, pasta, snack foods, and cereals. Avoid all processed foods. Meats and other proteins Fatty cuts of meat. Poultry with skin. Breaded or fried meats. Processed meat. Avoid saturated fats. Dairy Full-fat yogurt, cheese, or milk. Beverages Sweetened drinks, such as soda or iced tea. The items listed above may not be a complete list of foods and beverages you should avoid. Contact a dietitian for more information. Questions to ask a health care provider  Do I need to meet with a diabetes educator?  Do I need to meet with a dietitian?  What number can I call if I have questions?  When are the best times to check my blood glucose? Where to find more information:  American Diabetes Association: diabetes.org  Academy of Nutrition and Dietetics: www.eatright.org  National Institute of Diabetes and Digestive and Kidney Diseases: www.niddk.nih.gov  Association of Diabetes Care and Education Specialists: www.diabeteseducator.org Summary  It is important to have healthy eating   habits because your blood sugar (glucose) levels are greatly affected by what you eat and drink.  A healthy meal plan will help you control your blood glucose and maintain a healthy lifestyle.  Your health care provider may recommend that you work with a dietitian to make a meal plan that is best for you.  Keep in mind that carbohydrates (carbs) and alcohol have immediate effects on your blood glucose levels. It is important to count carbs and to use alcohol carefully. This information is not intended to replace advice given to you by your health care provider. Make sure you discuss any questions you have with your health care provider. Document Revised: 12/06/2018 Document Reviewed: 12/06/2018 [employer] Patient Education  2021 [employer]  

## 2020-03-31 ENCOUNTER — Other Ambulatory Visit: Payer: Self-pay | Admitting: Family Medicine

## 2020-03-31 DIAGNOSIS — E1165 Type 2 diabetes mellitus with hyperglycemia: Secondary | ICD-10-CM

## 2020-03-31 DIAGNOSIS — IMO0002 Reserved for concepts with insufficient information to code with codable children: Secondary | ICD-10-CM

## 2020-03-31 DIAGNOSIS — E785 Hyperlipidemia, unspecified: Secondary | ICD-10-CM

## 2020-04-08 ENCOUNTER — Other Ambulatory Visit: Payer: Self-pay | Admitting: Family Medicine

## 2020-04-08 ENCOUNTER — Ambulatory Visit (INDEPENDENT_AMBULATORY_CARE_PROVIDER_SITE_OTHER): Payer: Self-pay | Admitting: Otolaryngology

## 2020-04-08 DIAGNOSIS — E1165 Type 2 diabetes mellitus with hyperglycemia: Secondary | ICD-10-CM

## 2020-04-08 DIAGNOSIS — IMO0002 Reserved for concepts with insufficient information to code with codable children: Secondary | ICD-10-CM

## 2020-04-08 NOTE — Progress Notes (Signed)
Cardiology Office Note:    Date:  04/10/2020   ID:  Chad Pod., DOB October 23, 1965, MRN [id number]  PCP:  Susy Frizzle, MD   [place]  Cardiologist:  No primary care provider on file.  Advanced Practice Provider:  No care team member to display Electrophysiologist:  None    Referring MD: Alycia Rossetti, MD     History of Present Illness:    Chad Belloso. is a 55 y.o. male with a hx of DMII, HTN, HLD and depression who was referred by Dr. Buelah Manis for further evaluation of dyspnea on exertion.  The patient states that he is having worsening shortness of breath with exertion over the past year. This is especially worsened by walking up stairs, inclines or carrying heavy material. No associated chest pressure, lightheadness, or dizziness. No LE edema, orthopnea, PND. No palpitations. No known personal history of CAD but has several risk factors including HTN, DMII, HLD and family history.  Family history: Father with CAD s/p CABG (68), Paternal GF with massive MI at 69 (deceased at that time)   Past Medical History:  Diagnosis Date  . Depression   . Diabetes mellitus without complication ([place])   . Diabetes mellitus, type II ([place])   . GERD (gastroesophageal reflux disease)   . H. pylori infection   . HTN (hypertension)   . Hyperlipidemia   . Hypogonadism male   . Leg pain   . Low magnesium levels   . Neck pain   . Rhinitis, allergic   . Vitamin B12 deficiency   . Vitamin D deficiency   . White coat hypertension     Past Surgical History:  Procedure Laterality Date  . KNEE ARTHROSCOPY Right     Current Medications: Current Meds  Medication Sig  . aspirin 81 MG tablet Take 1 tablet (81 mg total) by mouth daily.  . BD VEO INSULIN SYRINGE U/F 31G X 15/64" 0.3 ML MISC USE WITH SLIDING SCALE INSULIN  . Continuous Blood Gluc Sensor (FREESTYLE LIBRE 14 DAY SENSOR) MISC Use as directed to monitor glucose continuously. Change sensor Q 14  days. Dx: E11.65.  Marland Kitchen glipiZIDE (GLUCOTROL) 5 MG tablet TAKE 1 TABLET BY MOUTH TWICE A DAY BEFORE A MEAL  . Insulin Pen Needle (BD PEN NEEDLE NANO 2ND GEN) 32G X 4 MM MISC USE AS DIRECTED WITH INSULIN  . LEVEMIR FLEXTOUCH 100 UNIT/ML FlexPen INJECT 20 UNITS IN THE MORNING AND 20 UNITS IN THE EVENING, SUBCUTANEOUSLY  . lisinopril (ZESTRIL) 10 MG tablet Take 1 tablet (10 mg total) by mouth daily.  . metFORMIN (GLUCOPHAGE) 1000 MG tablet TAKE 1 TABLET (1,000 MG TOTAL) BY MOUTH 2 (TWO) TIMES DAILY WITH A MEAL  . metoprolol tartrate (LOPRESSOR) 100 MG tablet Take 1 tablet 2 hours before procedure  . NOVOLIN R 100 UNIT/ML injection SLIDING SCALE 150-200=2UNITS,201-250=4 U,251-300=6U,301-350=8U,351-400=10 U,401-450=12U & MAKE APPT  . omeprazole (PRILOSEC) 40 MG capsule Take 1 capsule (40 mg total) by mouth daily.  Marland Kitchen PARoxetine (PAXIL) 20 MG tablet Take 1 tablet (20 mg total) by mouth daily.  . simvastatin (ZOCOR) 80 MG tablet TAKE 1 TABLET (80 MG TOTAL) BY MOUTH DAILY AT 6 PM.     Allergies:   Patient has no known allergies.   Social History   Socioeconomic History  . Marital status: Married    Spouse name: Not on file  . Number of children: Not on file  . Years of education: Not on file  . Highest  education level: Not on file  Occupational History  . Not on file  Tobacco Use  . Smoking status: Former Smoker    Quit date: 06/08/1991    Years since quitting: 28.8  . Smokeless tobacco: Never Used  Vaping Use  . Vaping Use: Never used  Substance and Sexual Activity  . Alcohol use: Yes    Alcohol/week: 0.0 standard drinks    Comment: occasionally  . Drug use: No  . Sexual activity: Yes  Other Topics Concern  . Not on file  Social History Narrative   Married. No children. Does not formal exercise.   In school at [employer] full-time. Works part-time job at night.   "Has to walk a mile from the car to classic [employer]"   Social Determinants of Health   Financial Resource Strain: Not on file   Food Insecurity: Not on file  Transportation Needs: Not on file  Physical Activity: Not on file  Stress: Not on file  Social Connections: Not on file     Family History: The patient's family history includes COPD in his mother; Hypertension in his father.  ROS:   Please see the history of present illness.    Review of Systems  Constitutional: Negative for chills and fever.  HENT: Negative for hearing loss and sore throat.   Eyes: Negative for blurred vision and redness.  Respiratory: Positive for sputum production. Negative for cough.   Cardiovascular: Negative for chest pain, palpitations, orthopnea, claudication, leg swelling and PND.  Gastrointestinal: Negative for heartburn and melena.  Genitourinary: Negative for dysuria and flank pain.  Musculoskeletal: Negative for falls.  Neurological: Negative for dizziness and loss of consciousness.  Endo/Heme/Allergies: Negative for polydipsia.  Psychiatric/Behavioral: Negative for substance abuse.    EKGs/Labs/Other Studies Reviewed:    The following studies were reviewed today: No cardiac studies  EKG:  EKG is  ordered today.  The ekg ordered today demonstrates NSR, borderline q waves in III, aVF, HR 99  Recent Labs: 03/15/2020: BUN 13; Creat 0.81; Hemoglobin 14.3; Platelets 352; Potassium 4.4; Sodium 140  Recent Lipid Panel    Component Value Date/Time   CHOL 244 (H) 02/27/2019 1602   TRIG 715 (H) 02/27/2019 1602   HDL 32 (L) 02/27/2019 1602   CHOLHDL 7.6 (H) 02/27/2019 1602   VLDL 38 (H) 05/07/2016 0940   LDLCALC  02/27/2019 1602     Comment:     . LDL cholesterol not calculated. Triglyceride levels greater than 400 mg/dL invalidate calculated LDL results. . Reference range: <100 . Desirable range <100 mg/dL for primary prevention;   <70 mg/dL for patients with CHD or diabetic patients  with > or = 2 CHD risk factors. Marland Kitchen LDL-C is now calculated using the Martin-Hopkins  calculation, which is a validated novel  method providing  better accuracy than the Friedewald equation in the  estimation of LDL-C.  Cresenciano Genre et al. Annamaria Helling. 5643;329(51): 2061-2068  (http://education.QuestDiagnostics.com/faq/FAQ164)    LDLDIRECT 68 03/15/2020 1159     Risk Assessment/Calculations:       Physical Exam:    VS:  BP 138/84   Pulse 99   Ht 5\' 10"  (1.778 m)   Wt 241 lb 3.2 oz (109.4 kg)   SpO2 98%   BMI 34.61 kg/m     Wt Readings from Last 3 Encounters:  04/10/20 241 lb 3.2 oz (109.4 kg)  04/09/20 241 lb (109.3 kg)  03/25/20 247 lb (112 kg)     GEN:  Well nourished, well developed in  no acute distress HEENT: Normal NECK: No JVD; No carotid bruits CARDIAC: RRR, no murmurs, rubs, gallops RESPIRATORY:  Clear to auscultation without rales, wheezing or rhonchi  ABDOMEN: Soft, non-tender, non-distended MUSCULOSKELETAL:  No edema; No deformity  SKIN: Warm and dry NEUROLOGIC:  Alert and oriented x 3 PSYCHIATRIC:  Normal affect   ASSESSMENT:    1. Precordial pain   2. Mixed hyperlipidemia   3. SOB (shortness of breath)   4. Dyspnea on exertion   5. Primary hypertension   6. Uncontrolled type 2 diabetes mellitus with hyperglycemia (HCC)    PLAN:    In order of problems listed above:  #DOE: Patient with progressive dyspnea on exertion that has been ongoing for the past year. No associated chest pressure, lightheadedness, palpitations. No HF symptoms. Has several risk factors for CAD including HTN, HLD, DMII, and family history. Given symptoms and risk factors, will perform ischemic work-up -Check coronary CTA -Check TTE -Continue ASA 81mg  daily  #HTN: -Continue lisinopril 10mg  daily  #HLD: -Simva 80mg  daily -Repeat lipids when returns for BMET as last TG 715 in 2021 -Talked about healthy diet and lifestyle today  #DMII: Uncontrolled with A1C 12.8. Referred to endocrinology. -Continue insulim -Continue glipizide -Continue metformin   Medication Adjustments/Labs and Tests  Ordered: Current medicines are reviewed at length with the patient today.  Concerns regarding medicines are outlined above.  Orders Placed This Encounter  Procedures  . CT CORONARY MORPH W/CTA COR W/SCORE W/CA W/CM &/OR WO/CM  . CT CORONARY FRACTIONAL FLOW RESERVE DATA PREP  . CT CORONARY FRACTIONAL FLOW RESERVE FLUID ANALYSIS  . Basic metabolic panel  . Lipid panel  . EKG 12-Lead  . ECHOCARDIOGRAM COMPLETE   Meds ordered this encounter  Medications  . metoprolol tartrate (LOPRESSOR) 100 MG tablet    Sig: Take 1 tablet 2 hours before procedure    Dispense:  1 tablet    Refill:  0    Patient Instructions   Medication Instructions:  Your physician recommends that you continue on your current medications as directed. Please refer to the Current Medication list given to you today. *If you need a refill on your cardiac medications before your next appointment, please call your pharmacy*   Lab Work: BMET and Lipids when fasting If you have labs (blood work) drawn today and your tests are completely normal, you will receive your results only by: Marland Kitchen MyChart Message (if you have MyChart) OR . A paper copy in the mail If you have any lab test that is abnormal or we need to change your treatment, we will call you to review the results.   Testing/Procedures: Your physician has requested that you have an echocardiogram. Echocardiography is a painless test that uses sound waves to create images of your heart. It provides your doctor with information about the size and shape of your heart and how well your heart's chambers and valves are working. This procedure takes approximately one hour. There are no restrictions for this procedure.  Your cardiac CT will be scheduled at one of the below locations:   [facility] [address] [place], [place] [address] [phone]   If scheduled at [facility], please arrive at the [facility] main entrance (entrance A) of  [facility] 30 minutes prior to test start time. Proceed to the [facility] Radiology Department (first floor) to check-in and test prep.   Please follow these instructions carefully (unless otherwise directed):  Hold all erectile dysfunction medications  at least 3 days (72 hrs) prior to test.  On the Night Before the Test: . Be sure to Drink plenty of water. . Do not consume any caffeinated/decaffeinated beverages or chocolate 12 hours prior to your test. . Do not take any antihistamines 12 hours prior to your test  On the Day of the Test: . Drink plenty of water until 1 hour prior to the test. . Do not eat any food 4 hours prior to the test. . You may take your regular medications prior to the test.  . Take metoprolol (Lopressor) two hours prior to test. . HOLD Furosemide/Hydrochlorothiazide morning of the test. . FEMALES- please wear underwire-free bra if available        After the Test: . Drink plenty of water. . After receiving IV contrast, you may experience a mild flushed feeling. This is normal. . On occasion, you may experience a mild rash up to 24 hours after the test. This is not dangerous. If this occurs, you can take Benadryl 25 mg and increase your fluid intake. . If you experience trouble breathing, this can be serious. If it is severe call 911 IMMEDIATELY. If it is mild, please call our office. . If you take any of these medications: Glipizide/Metformin, Avandament, Glucavance, please do not take 48 hours after completing test unless otherwise instructed.   Once we have confirmed authorization from your insurance company, we will call you to set up a date and time for your test. Based on how quickly your insurance processes prior authorizations requests, please allow up to 4 weeks to be contacted for scheduling your Cardiac CT appointment. Be advised that routine Cardiac CT appointments could be scheduled as many as 8 weeks after your provider has ordered  it.  For non-scheduling related questions, please contact the cardiac imaging nurse navigator should you have any questions/concerns: Marchia Bond, Cardiac Imaging Nurse Navigator Gordy Clement, Cardiac Imaging Nurse Navigator [facility] Direct Office Dial: [phone]   For scheduling needs, including cancellations and rescheduling, please call [place], [phone].    Follow-Up: At [facility], you and your health needs are our priority.  As part of our continuing mission to provide you with exceptional heart care, we have created designated Provider Care Teams.  These Care Teams include your primary Cardiologist (physician) and Advanced Practice Providers (APPs -  Physician Assistants and Nurse Practitioners) who all work together to provide you with the care you need, when you need it.  We recommend signing up for the patient portal called "MyChart".  Sign up information is provided on this After Visit Summary.  MyChart is used to connect with patients for Virtual Visits (Telemedicine).  Patients are able to view lab/test results, encounter notes, upcoming appointments, etc.  Non-urgent messages can be sent to your provider as well.   To learn more about what you can do with MyChart, go to [email or web address].    Your next appointment:   6 month(s)  The format for your next appointment:   In Person  Provider:   You will see one of the following Advanced Practice Providers on your designated Care Team:    Richardson Dopp, PA-C  [place], [place]    Other Instructions   Mediterranean Diet A Mediterranean diet refers to food and lifestyle choices that are based on the traditions of countries located on the [employer]. This way of eating has been shown to help prevent certain conditions and improve outcomes for people who have  chronic diseases, like kidney disease and heart disease. What are tips for following this  plan? Lifestyle  Cook and eat meals together with your family, when possible.  Drink enough fluid to keep your urine clear or pale yellow.  Be physically active every day. This includes: ? Aerobic exercise like running or swimming. ? Leisure activities like gardening, walking, or housework.  Get 7-8 hours of sleep each night.  If recommended by your health care provider, drink red wine in moderation. This means 1 glass a day for nonpregnant women and 2 glasses a day for men. A glass of wine equals 5 oz (150 mL). Reading food labels  Check the serving size of packaged foods. For foods such as rice and pasta, the serving size refers to the amount of cooked product, not dry.  Check the total fat in packaged foods. Avoid foods that have saturated fat or trans fats.  Check the ingredients list for added sugars, such as corn syrup.   Shopping  At the grocery store, buy most of your food from the areas near the walls of the store. This includes: ? Fresh fruits and vegetables (produce). ? Grains, beans, nuts, and seeds. Some of these may be available in unpackaged forms or large amounts (in bulk). ? Fresh seafood. ? Poultry and eggs. ? Low-fat dairy products.  Buy whole ingredients instead of prepackaged foods.  Buy fresh fruits and vegetables in-season from local farmers markets.  Buy frozen fruits and vegetables in resealable bags.  If you do not have access to quality fresh seafood, buy precooked frozen shrimp or canned fish, such as tuna, salmon, or sardines.  Buy small amounts of raw or cooked vegetables, salads, or olives from the deli or salad bar at your store.  Stock your pantry so you always have certain foods on hand, such as olive oil, canned tuna, canned tomatoes, rice, pasta, and beans. Cooking  Cook foods with extra-virgin olive oil instead of using butter or other vegetable oils.  Have meat as a side dish, and have vegetables or grains as your main dish. This  means having meat in small portions or adding small amounts of meat to foods like pasta or stew.  Use beans or vegetables instead of meat in common dishes like chili or lasagna.  Experiment with different cooking methods. Try roasting or broiling vegetables instead of steaming or sauteing them.  Add frozen vegetables to soups, stews, pasta, or rice.  Add nuts or seeds for added healthy fat at each meal. You can add these to yogurt, salads, or vegetable dishes.  Marinate fish or vegetables using olive oil, lemon juice, garlic, and fresh herbs. Meal planning  Plan to eat 1 vegetarian meal one day each week. Try to work up to 2 vegetarian meals, if possible.  Eat seafood 2 or more times a week.  Have healthy snacks readily available, such as: ? Vegetable sticks with hummus. ? [place] yogurt. ? Fruit and nut trail mix.  Eat balanced meals throughout the week. This includes: ? Fruit: 2-3 servings a day ? Vegetables: 4-5 servings a day ? Low-fat dairy: 2 servings a day ? Fish, poultry, or lean meat: 1 serving a day ? Beans and legumes: 2 or more servings a week ? Nuts and seeds: 1-2 servings a day ? Whole grains: 6-8 servings a day ? Extra-virgin olive oil: 3-4 servings a day  Limit red meat and sweets to only a few servings a month   What are my food  choices?  Mediterranean diet ? Recommended  Grains: Whole-grain pasta. Brown rice. Bulgar wheat. Polenta. Couscous. Whole-wheat bread. [place].  Vegetables: Artichokes. Beets. Broccoli. Cabbage. Carrots. Eggplant. Green beans. Chard. Kale. Spinach. Onions. Leeks. Peas. Squash. Tomatoes. Peppers. Radishes.  Fruits: Apples. Apricots. Avocado. Berries. Bananas. Cherries. Dates. Figs. Grapes. Lemons. Melon. Oranges. Peaches. Plums. Pomegranate.  Meats and other protein foods: Beans. Almonds. Sunflower seeds. Pine nuts. Peanuts. [place]. Salmon. Scallops. Shrimp. [place]. Tilapia. Clams. Oysters. Eggs.  Dairy: Low-fat milk. Cheese.  Greek yogurt.  Beverages: Water. Red wine. Herbal tea.  Fats and oils: Extra virgin olive oil. Avocado oil. Grape seed oil.  Sweets and desserts: [place] yogurt with honey. Baked apples. Poached pears. Trail mix.  Seasoning and other foods: Basil. Cilantro. Coriander. Cumin. Mint. Parsley. Sage. Rosemary. Tarragon. Garlic. Oregano. Thyme. Pepper. Balsalmic vinegar. Tahini. Hummus. Tomato sauce. Olives. Mushrooms. ? Limit these  Grains: Prepackaged pasta or rice dishes. Prepackaged cereal with added sugar.  Vegetables: Deep fried potatoes (french fries).  Fruits: Fruit canned in syrup.  Meats and other protein foods: Beef. Pork. Lamb. Poultry with skin. Hot dogs. Berniece Salines.  Dairy: Ice cream. Sour cream. Whole milk.  Beverages: Juice. Sugar-sweetened soft drinks. Beer. Liquor and spirits.  Fats and oils: Butter. Canola oil. Vegetable oil. Beef fat (tallow). Lard.  Sweets and desserts: Cookies. Cakes. Pies. Candy.  Seasoning and other foods: Mayonnaise. Premade sauces and marinades. The items listed may not be a complete list. Talk with your dietitian about what dietary choices are right for you. Summary  The Mediterranean diet includes both food and lifestyle choices.  Eat a variety of fresh fruits and vegetables, beans, nuts, seeds, and whole grains.  Limit the amount of red meat and sweets that you eat.  Talk with your health care provider about whether it is safe for you to drink red wine in moderation. This means 1 glass a day for nonpregnant women and 2 glasses a day for men. A glass of wine equals 5 oz (150 mL). This information is not intended to replace advice given to you by your health care provider. Make sure you discuss any questions you have with your health care provider. Document Revised: 08/29/2015 Document Reviewed: 08/22/2015 Elsevier Patient Education  2020 [place], Freada Bergeron, MD  04/10/2020 10:28 AM    [place]

## 2020-04-09 ENCOUNTER — Other Ambulatory Visit: Payer: Self-pay

## 2020-04-09 ENCOUNTER — Encounter: Payer: Self-pay | Admitting: Nurse Practitioner

## 2020-04-09 ENCOUNTER — Ambulatory Visit (INDEPENDENT_AMBULATORY_CARE_PROVIDER_SITE_OTHER): Payer: BC Managed Care – PPO | Admitting: Nurse Practitioner

## 2020-04-09 VITALS — BP 135/83 | HR 93 | Ht 70.0 in | Wt 241.0 lb

## 2020-04-09 DIAGNOSIS — E1165 Type 2 diabetes mellitus with hyperglycemia: Secondary | ICD-10-CM

## 2020-04-09 DIAGNOSIS — E782 Mixed hyperlipidemia: Secondary | ICD-10-CM | POA: Diagnosis not present

## 2020-04-09 DIAGNOSIS — I10 Essential (primary) hypertension: Secondary | ICD-10-CM | POA: Diagnosis not present

## 2020-04-09 NOTE — Patient Instructions (Signed)

## 2020-04-09 NOTE — Progress Notes (Signed)
Endocrinology Follow Up Note       04/09/2020, 10:15 AM   Subjective:    Patient ID: Chad Pod., male    DOB: November 11, 1965.  Chad Pod. is being seen in follow up after being seen in consultation for management of currently uncontrolled symptomatic diabetes requested by  Susy Frizzle, MD.   Past Medical History:  Diagnosis Date  . Depression   . Diabetes mellitus without complication ([place])   . Diabetes mellitus, type II ([place])   . GERD (gastroesophageal reflux disease)   . H. pylori infection   . HTN (hypertension)   . Hyperlipidemia   . Hypogonadism male   . Leg pain   . Low magnesium levels   . Neck pain   . Rhinitis, allergic   . Vitamin B12 deficiency   . Vitamin D deficiency   . White coat hypertension     Past Surgical History:  Procedure Laterality Date  . KNEE ARTHROSCOPY Right     Social History   Socioeconomic History  . Marital status: Married    Spouse name: Not on file  . Number of children: Not on file  . Years of education: Not on file  . Highest education level: Not on file  Occupational History  . Not on file  Tobacco Use  . Smoking status: Former Smoker    Quit date: 06/08/1991    Years since quitting: 28.8  . Smokeless tobacco: Never Used  Vaping Use  . Vaping Use: Never used  Substance and Sexual Activity  . Alcohol use: Yes    Alcohol/week: 0.0 standard drinks    Comment: occasionally  . Drug use: No  . Sexual activity: Yes  Other Topics Concern  . Not on file  Social History Narrative   Married. No children. Does not formal exercise.   In school at [employer] full-time. Works part-time job at night.   "Has to walk a mile from the car to classic [employer]"   Social Determinants of Health   Financial Resource Strain: Not on file  Food Insecurity: Not on file  Transportation Needs: Not on file  Physical Activity: Not on file  Stress: Not on file  Social  Connections: Not on file    Family History  Problem Relation Age of Onset  . COPD Mother        smoker  . Hypertension Father     Outpatient Encounter Medications as of 04/09/2020  Medication Sig  . aspirin 81 MG tablet Take 1 tablet (81 mg total) by mouth daily.  . BD VEO INSULIN SYRINGE U/F 31G X 15/64" 0.3 ML MISC USE WITH SLIDING SCALE INSULIN  . Blood Glucose Monitoring Suppl (BLOOD GLUCOSE SYSTEM PAK) KIT Please dispense based on patient and insurance preference. Use as directed to monitor FSBS 2x daily. Dx: E11.9 (Patient not taking: Reported on 03/25/2020)  . Continuous Blood Gluc Sensor (FREESTYLE LIBRE 14 DAY SENSOR) MISC Use as directed to monitor glucose continuously. Change sensor Q 14 days. Dx: E11.65.  Marland Kitchen glipiZIDE (GLUCOTROL) 5 MG tablet TAKE 1 TABLET BY MOUTH TWICE A DAY BEFORE A MEAL  . Glucose Blood (BLOOD GLUCOSE TEST STRIPS) STRP Please dispense based  on patient and insurance preference. Use as directed to monitor blood sugar 2-3 x a day. Dx: E11.65, Z79.4 (Patient not taking: Reported on 03/25/2020)  . insulin detemir (LEVEMIR FLEXTOUCH) 100 UNIT/ML FlexPen Inject 40 Units into the skin at bedtime.  . Insulin Pen Needle (BD PEN NEEDLE NANO 2ND GEN) 32G X 4 MM MISC USE AS DIRECTED WITH INSULIN  . Lancets MISC Please dispense based on patient and insurance preference. Use as directed to monitor FSBS 3x daily. Dx: E11.65. (Patient not taking: Reported on 03/25/2020)  . lisinopril (ZESTRIL) 10 MG tablet Take 1 tablet (10 mg total) by mouth daily.  . metFORMIN (GLUCOPHAGE) 1000 MG tablet TAKE 1 TABLET (1,000 MG TOTAL) BY MOUTH 2 (TWO) TIMES DAILY WITH A MEAL  . NOVOLIN R 100 UNIT/ML injection SLIDING SCALE 150-200=2UNITS,201-250=4 U,251-300=6U,301-350=8U,351-400=10 U,401-450=12U & MAKE APPT (Patient taking differently: 5-11 Units 3 (three) times daily before meals.)  . omeprazole (PRILOSEC) 40 MG capsule Take 1 capsule (40 mg total) by mouth daily.  Marland Kitchen PARoxetine (PAXIL) 20 MG  tablet Take 1 tablet (20 mg total) by mouth daily.  . simvastatin (ZOCOR) 80 MG tablet TAKE 1 TABLET (80 MG TOTAL) BY MOUTH DAILY AT 6 PM.   No facility-administered encounter medications on file as of 04/09/2020.    ALLERGIES: No Known Allergies  VACCINATION STATUS: Immunization History  Administered Date(s) Administered  . PFIZER(Purple Top)SARS-COV-2 Vaccination 04/13/2019, 05/13/2019, 12/17/2019  . Pneumococcal Polysaccharide-23 01/17/2014  . Tdap 05/07/2016    Diabetes He presents for his follow-up diabetic visit. He has type 2 diabetes mellitus. Onset time: was diagnosed at approx age of 39. His disease course has been improving. There are no hypoglycemic associated symptoms. Associated symptoms include fatigue. Pertinent negatives for diabetes include no polydipsia and no polyuria. There are no hypoglycemic complications. Symptoms are stable. Diabetic complications include impotence. 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 Current diabetic treatment includes intensive insulin program and oral agent (dual therapy). He is compliant with treatment most of the time. His weight is decreasing steadily. He is following a generally unhealthy diet. When asked about meal planning, he reported none. He has had a previous visit with a dietitian (Did not find it beneficial). He rarely participates in exercise. His home blood glucose trend is fluctuating minimally. (He presents today with his CGM and logs showing slightly improved, yet still above target glycemic profile both fasting and postprandial.  He admits he still is struggling with finding a fitting meal routine given his busy work schedule.  He also admits he sometimes misses his Novolin R insulin while he is on the road.  He denies any  hypoglycemia.) An ACE inhibitor/angiotensin II receptor blocker is being taken. He does not see a podiatrist.Eye exam is current.  Hyperlipidemia This is a chronic problem. The current episode started more than 1 year ago. The problem is controlled. Recent lipid tests were reviewed and are normal. Exacerbating diseases include diabetes and obesity. Factors aggravating his hyperlipidemia include fatty foods. Current antihyperlipidemic treatment includes statins. The current treatment provides moderate improvement of lipids. Compliance problems include adherence to diet and adherence to exercise.  Risk factors for coronary artery disease include diabetes mellitus, dyslipidemia, hypertension, male sex, obesity and a sedentary lifestyle.  Hypertension This is a chronic problem. The current episode started more than 1 year ago. The problem has been gradually improving  since onset. The problem is controlled. There are no associated agents to hypertension. Risk factors for coronary artery disease include diabetes mellitus, dyslipidemia, family history, male gender, obesity and sedentary lifestyle. Past treatments include ACE inhibitors. The current treatment provides mild improvement. Compliance problems include diet and exercise.      Review of systems  Constitutional: + Minimally fluctuating body weight,  current Body mass index is 34.58 kg/m. , + fatigue, no subjective hyperthermia, no subjective hypothermia Eyes: + blurry vision, no xerophthalmia ENT: no sore throat, no nodules palpated in throat, no dysphagia/odynophagia, no hoarseness Cardiovascular: no chest pain, no shortness of breath, no palpitations, no leg swelling Respiratory: no cough, no shortness of breath Gastrointestinal: no nausea/vomiting/diarrhea Musculoskeletal: no muscle/joint aches Skin: no rashes, no hyperemia Neurological: no tremors, no numbness, no tingling, no dizziness Psychiatric: no depression, no anxiety  Objective:      BP 135/83   Pulse 93   Ht '5\' 10"'  (1.778 m)   Wt 241 lb (109.3 kg)   BMI 34.58 kg/m   Wt Readings from Last 3 Encounters:  04/09/20 241 lb (109.3 kg)  03/25/20 247 lb (112 kg)  03/15/20 243 lb (110.2 kg)     BP Readings from Last 3 Encounters:  04/09/20 135/83  03/25/20 (!) 146/84  03/15/20 (!) 144/78     Physical Exam- Limited  Constitutional:  Body mass index is 34.58 kg/m. , not in acute distress, mildly anxious state of mind Eyes:  EOMI, no exophthalmos Neck: Supple Cardiovascular: RRR, no murmers, rubs, or gallops, no edema Respiratory: Adequate breathing efforts, no crackles, rales, rhonchi, or wheezing Musculoskeletal: no gross deformities, strength intact in all four extremities, no gross restriction of joint movements Skin:  no rashes, no hyperemia Neurological: no tremor with outstretched hands    Foot exam:   No rashes, ulcers, cuts, calluses, onychodystrophy.   Good pulses bilat.  Good sensation to 10 g monofilament bilat.    POCT ABI Results 04/09/20   Right ABI:  1.30      Left ABI:  1.30  Right leg systolic / diastolic: 099/833 mmHg Left leg systolic / diastolic: 825/053 mmHg  Arm systolic / diastolic: 976/73 mmHG  Detailed report will be scanned into patient chart.    CMP ( most recent) CMP     Component Value Date/Time   NA 140 03/15/2020 1159   K 4.4 03/15/2020 1159   CL 105 03/15/2020 1159   CO2 22 03/15/2020 1159   GLUCOSE 283 (H) 03/15/2020 1159   BUN 13 03/15/2020 1159   CREATININE 0.81 03/15/2020 1159   CALCIUM 9.4 03/15/2020 1159   PROT 7.3 02/27/2019 1602   ALBUMIN 4.3 05/07/2016 0940   AST 15 02/27/2019 1602   ALT 15 02/27/2019 1602   ALKPHOS 91 05/07/2016 0940   BILITOT 0.5 02/27/2019 1602   GFRNONAA >60 02/28/2019 0927   GFRNONAA 75 02/27/2019 1602   GFRAA >60 02/28/2019 0927   GFRAA 87 02/27/2019 1602     Diabetic Labs (most recent): Lab Results  Component Value Date   HGBA1C 12.8 (H) 03/15/2020    HGBA1C 13.9 (H) 07/13/2019   HGBA1C >14.0 (H) 02/27/2019     Lipid Panel ( most recent) Lipid Panel     Component Value Date/Time   CHOL 244 (H) 02/27/2019 1602   TRIG 715 (H) 02/27/2019 1602   HDL 32 (L) 02/27/2019 1602   CHOLHDL 7.6 (H) 02/27/2019 1602   VLDL 38 (H) 05/07/2016 0940   LDLCALC  02/27/2019 1602  Comment:     . LDL cholesterol not calculated. Triglyceride levels greater than 400 mg/dL invalidate calculated LDL results. . Reference range: <100 . Desirable range <100 mg/dL for primary prevention;   <70 mg/dL for patients with CHD or diabetic patients  with > or = 2 CHD risk factors. Marland Kitchen LDL-C is now calculated using the Martin-Hopkins  calculation, which is a validated novel method providing  better accuracy than the Friedewald equation in the  estimation of LDL-C.  Cresenciano Genre et al. Annamaria Helling. 0086;761(95): 2061-2068  (http://education.[facility].com/faq/FAQ164)    LDLDIRECT 68 03/15/2020 1159      Lab Results  Component Value Date   TSH 0.92 03/10/2018   TSH 0.69 05/07/2016           Assessment & Plan:   1) Uncontrolled type 2 diabetes mellitus with hyperglycemia ([facility])  - Chad Pod. has currently uncontrolled symptomatic type 2 DM since 55 years of age, with most recent A1c of 12.8 %.   He presents today with his CGM and logs showing slightly improved, yet still above target glycemic profile both fasting and postprandial.  He admits he still is struggling with finding a fitting meal routine given his busy work schedule.  He also admits he sometimes misses his Novolin R insulin while he is on the road.  He denies any hypoglycemia.  Analysis of his CGM shows TIR 33%, TAR 67%, TBR 0%.  -Recent labs reviewed.  - I had a long discussion with him about the progressive nature of diabetes and the pathology behind its complications. -his diabetes is not currently complicated but he remains at a high risk for more acute and chronic complications  which include CAD, CVA, CKD, retinopathy, and neuropathy. These are all discussed in detail with him.  - Nutritional counseling repeated at each appointment due to patients tendency to fall back in to old habits.  - The patient admits there is a room for improvement in their diet and drink choices. -  Suggestion is made for the patient to avoid simple carbohydrates from their diet including Cakes, Sweet Desserts / Pastries, Ice Cream, Soda (diet and regular), Sweet Tea, Candies, Chips, Cookies, Sweet Pastries,  Store Bought Juices, Alcohol in Excess of  1-2 drinks a day, Artificial Sweeteners, Coffee Creamer, and "Sugar-free" Products. This will help patient to have stable blood glucose profile and potentially avoid unintended weight gain.   - I encouraged the patient to switch to  unprocessed or minimally processed complex starch and increased protein intake (animal or plant source), fruits, and vegetables.   - Patient is advised to stick to a routine mealtimes to eat 3 meals  a day and avoid unnecessary snacks ( to snack only to correct hypoglycemia).  - I have approached him with the following individualized plan to manage  his diabetes and patient agrees:   -Given his fasting hyperglycemia, he will tolerate increase in his Levemir to 50 units SQ nightly.  He is advised to be more consistent with taking his Novolin R 5-11 units TID with meals if glucose is above 90 and he is eating.  Specific instructions on how to titrate insulin dose based on glucose readings given to patient in writing. He is advised to continue Metformin 1000 mg po twice daily and Glipizide 5 mg po twice daily, therapeutically suitable for him.  -he is encouraged to use his CGM to continue monitoring blood glucose 4 times daily, before meals and before bed, and to call the clinic if he  has readings less than 70 or greater than 300 for 3 tests in a row.  - he is warned not to take insulin without proper monitoring per  orders. - Adjustment parameters are given to him for hypo and hyperglycemia in writing.  - he will be considered for incretin therapy as appropriate next visit.  - Specific targets for  A1c;  LDL, HDL,  and Triglycerides were discussed with the patient.  2) Blood Pressure /Hypertension:  his blood pressure is controlled to target. There is documentation of white coat syndrome.  he is advised to continue his current medications including Lisinopril 10 mg p.o. daily with breakfast.  3) Lipids/Hyperlipidemia:    Review of his recent lipid panel from 03/15/20 showed controlled LDL at 68 .  he  is advised to continue Simvastatin 80 mg daily at bedtime.  Side effects and precautions discussed with him.  4)  Weight/Diet:  his Body mass index is 34.58 kg/m.  -  clearly complicating his diabetes care.   he is candidate for weight loss. I discussed with him the fact that loss of 5 - 10% of his  current body weight will have the most impact on his diabetes management.  Exercise, and detailed carbohydrates information provided  -  detailed on discharge instructions.  5) Chronic Care/Health Maintenance: -he is on ACEI/ARB and Statin medications and is encouraged to initiate and continue to follow up with Ophthalmology, Dentist, Podiatrist at least yearly or according to recommendations, and advised to stay away from smoking. I have recommended yearly flu vaccine and pneumonia vaccine at least every 5 years; moderate intensity exercise for up to 150 minutes weekly; and sleep for at least 7 hours a day.  - he is advised to maintain close follow up with Susy Frizzle, MD for primary care needs, as well as his other providers for optimal and coordinated care.   - Time spent on this patient care encounter:  40 min, of which > 50% was spent in  counseling and the rest reviewing his blood glucose logs , discussing his hypoglycemia and hyperglycemia episodes, reviewing his current and  previous labs / studies  (  including abstraction from other facilities) and medications  doses and developing a  long term treatment plan and documenting his care.   Please refer to Patient Instructions for Blood Glucose Monitoring and Insulin/Medications Dosing Guide"  in media tab for additional information. Please  also refer to " Patient Self Inventory" in the Media  tab for reviewed elements of pertinent patient history.  Chad Pod. participated in the discussions, expressed understanding, and voiced agreement with the above plans.  All questions were answered to his satisfaction. he is encouraged to contact clinic should he have any questions or concerns prior to his return visit.   Follow up plan: - Return in about 3 months (around 07/10/2020) for Diabetes follow up- A1c and urine micro in office, No previsit labs, Bring glucometer and logs.  Rayetta Pigg, [facility] [address] [place], [place] [address] Phone: [phone] Fax: [phone]  04/09/2020, 10:15 AM

## 2020-04-10 ENCOUNTER — Encounter: Payer: Self-pay | Admitting: Cardiology

## 2020-04-10 ENCOUNTER — Ambulatory Visit (INDEPENDENT_AMBULATORY_CARE_PROVIDER_SITE_OTHER): Payer: BC Managed Care – PPO | Admitting: Cardiology

## 2020-04-10 VITALS — BP 138/84 | HR 99 | Ht 70.0 in | Wt 241.2 lb

## 2020-04-10 DIAGNOSIS — R072 Precordial pain: Secondary | ICD-10-CM | POA: Diagnosis not present

## 2020-04-10 DIAGNOSIS — E1165 Type 2 diabetes mellitus with hyperglycemia: Secondary | ICD-10-CM

## 2020-04-10 DIAGNOSIS — R0600 Dyspnea, unspecified: Secondary | ICD-10-CM

## 2020-04-10 DIAGNOSIS — I10 Essential (primary) hypertension: Secondary | ICD-10-CM

## 2020-04-10 DIAGNOSIS — E782 Mixed hyperlipidemia: Secondary | ICD-10-CM

## 2020-04-10 DIAGNOSIS — R0609 Other forms of dyspnea: Secondary | ICD-10-CM

## 2020-04-10 DIAGNOSIS — R0602 Shortness of breath: Secondary | ICD-10-CM

## 2020-04-10 MED ORDER — METOPROLOL TARTRATE 100 MG PO TABS: ORAL_TABLET | ORAL | 0 refills | Status: DC

## 2020-04-10 NOTE — Patient Instructions (Signed)
Medication Instructions:  Your physician recommends that you continue on your current medications as directed. Please refer to the Current Medication list given to you today. *If you need a refill on your cardiac medications before your next appointment, please call your pharmacy*   Lab Work: BMET and Lipids when fasting If you have labs (blood work) drawn today and your tests are completely normal, you will receive your results only by: Marland Kitchen MyChart Message (if you have MyChart) OR . A paper copy in the mail If you have any lab test that is abnormal or we need to change your treatment, we will call you to review the results.   Testing/Procedures: Your physician has requested that you have an echocardiogram. Echocardiography is a painless test that uses sound waves to create images of your heart. It provides your doctor with information about the size and shape of your heart and how well your heart's chambers and valves are working. This procedure takes approximately one hour. There are no restrictions for this procedure.  Your cardiac CT will be scheduled at one of the below locations:   [facility] [address] [place], [place] [address] [phone]   If scheduled at [facility], please arrive at the [facility] main entrance (entrance A) of [facility] 30 minutes prior to test start time. Proceed to the [facility] Radiology Department (first floor) to check-in and test prep.   Please follow these instructions carefully (unless otherwise directed):  Hold all erectile dysfunction medications at least 3 days (72 hrs) prior to test.  On the Night Before the Test: . Be sure to Drink plenty of water. . Do not consume any caffeinated/decaffeinated beverages or chocolate 12 hours prior to your test. . Do not take any antihistamines 12 hours prior to your test  On the Day of the Test: . Drink plenty of water until 1 hour prior to the test. . Do not eat  any food 4 hours prior to the test. . You may take your regular medications prior to the test.  . Take metoprolol (Lopressor) two hours prior to test. . HOLD Furosemide/Hydrochlorothiazide morning of the test. . FEMALES- please wear underwire-free bra if available        After the Test: . Drink plenty of water. . After receiving IV contrast, you may experience a mild flushed feeling. This is normal. . On occasion, you may experience a mild rash up to 24 hours after the test. This is not dangerous. If this occurs, you can take Benadryl 25 mg and increase your fluid intake. . If you experience trouble breathing, this can be serious. If it is severe call 911 IMMEDIATELY. If it is mild, please call our office. . If you take any of these medications: Glipizide/Metformin, Avandament, Glucavance, please do not take 48 hours after completing test unless otherwise instructed.   Once we have confirmed authorization from your insurance company, we will call you to set up a date and time for your test. Based on how quickly your insurance processes prior authorizations requests, please allow up to 4 weeks to be contacted for scheduling your Cardiac CT appointment. Be advised that routine Cardiac CT appointments could be scheduled as many as 8 weeks after your provider has ordered it.  For non-scheduling related questions, please contact the cardiac imaging nurse navigator should you have any questions/concerns: Marchia Bond, Cardiac Imaging Nurse Navigator Gordy Clement, Cardiac Imaging Nurse Navigator [facility] Direct  Office Dial: [phone]   For scheduling needs, including cancellations and rescheduling, please call [place], [phone].    Follow-Up: At [facility], you and your health needs are our priority.  As part of our continuing mission to provide you with exceptional heart care, we have created designated Provider Care Teams.  These Care Teams include  your primary Cardiologist (physician) and Advanced Practice Providers (APPs -  Physician Assistants and Nurse Practitioners) who all work together to provide you with the care you need, when you need it.  We recommend signing up for the patient portal called "MyChart".  Sign up information is provided on this After Visit Summary.  MyChart is used to connect with patients for Virtual Visits (Telemedicine).  Patients are able to view lab/test results, encounter notes, upcoming appointments, etc.  Non-urgent messages can be sent to your provider as well.   To learn more about what you can do with MyChart, go to [email or web address].    Your next appointment:   6 month(s)  The format for your next appointment:   In Person  Provider:   You will see one of the following Advanced Practice Providers on your designated Care Team:    Richardson Dopp, PA-C  [place], [place]    Other Instructions   Mediterranean Diet A Mediterranean diet refers to food and lifestyle choices that are based on the traditions of countries located on the [employer]. This way of eating has been shown to help prevent certain conditions and improve outcomes for people who have chronic diseases, like kidney disease and heart disease. What are tips for following this plan? Lifestyle  Cook and eat meals together with your family, when possible.  Drink enough fluid to keep your urine clear or pale yellow.  Be physically active every day. This includes: ? Aerobic exercise like running or swimming. ? Leisure activities like gardening, walking, or housework.  Get 7-8 hours of sleep each night.  If recommended by your health care provider, drink red wine in moderation. This means 1 glass a day for nonpregnant women and 2 glasses a day for men. A glass of wine equals 5 oz (150 mL). Reading food labels  Check the serving size of packaged foods. For foods such as rice and pasta, the serving size refers to the  amount of cooked product, not dry.  Check the total fat in packaged foods. Avoid foods that have saturated fat or trans fats.  Check the ingredients list for added sugars, such as corn syrup.   Shopping  At the grocery store, buy most of your food from the areas near the walls of the store. This includes: ? Fresh fruits and vegetables (produce). ? Grains, beans, nuts, and seeds. Some of these may be available in unpackaged forms or large amounts (in bulk). ? Fresh seafood. ? Poultry and eggs. ? Low-fat dairy products.  Buy whole ingredients instead of prepackaged foods.  Buy fresh fruits and vegetables in-season from local farmers markets.  Buy frozen fruits and vegetables in resealable bags.  If you do not have access to quality fresh seafood, buy precooked frozen shrimp or canned fish, such as tuna, salmon, or sardines.  Buy small amounts of raw or cooked vegetables, salads, or olives from the deli or salad bar at your store.  Stock your pantry so you always have certain foods on hand, such as olive oil, canned tuna, canned tomatoes, rice, pasta, and beans. Cooking  Cook foods with extra-virgin olive oil instead of using  butter or other vegetable oils.  Have meat as a side dish, and have vegetables or grains as your main dish. This means having meat in small portions or adding small amounts of meat to foods like pasta or stew.  Use beans or vegetables instead of meat in common dishes like chili or lasagna.  Experiment with different cooking methods. Try roasting or broiling vegetables instead of steaming or sauteing them.  Add frozen vegetables to soups, stews, pasta, or rice.  Add nuts or seeds for added healthy fat at each meal. You can add these to yogurt, salads, or vegetable dishes.  Marinate fish or vegetables using olive oil, lemon juice, garlic, and fresh herbs. Meal planning  Plan to eat 1 vegetarian meal one day each week. Try to work up to 2 vegetarian meals,  if possible.  Eat seafood 2 or more times a week.  Have healthy snacks readily available, such as: ? Vegetable sticks with hummus. ? [place] yogurt. ? Fruit and nut trail mix.  Eat balanced meals throughout the week. This includes: ? Fruit: 2-3 servings a day ? Vegetables: 4-5 servings a day ? Low-fat dairy: 2 servings a day ? Fish, poultry, or lean meat: 1 serving a day ? Beans and legumes: 2 or more servings a week ? Nuts and seeds: 1-2 servings a day ? Whole grains: 6-8 servings a day ? Extra-virgin olive oil: 3-4 servings a day  Limit red meat and sweets to only a few servings a month   What are my food choices?  Mediterranean diet ? Recommended  Grains: Whole-grain pasta. Brown rice. Bulgar wheat. Polenta. Couscous. Whole-wheat bread. [place].  Vegetables: Artichokes. Beets. Broccoli. Cabbage. Carrots. Eggplant. Green beans. Chard. Kale. Spinach. Onions. Leeks. Peas. Squash. Tomatoes. Peppers. Radishes.  Fruits: Apples. Apricots. Avocado. Berries. Bananas. Cherries. Dates. Figs. Grapes. Lemons. Melon. Oranges. Peaches. Plums. Pomegranate.  Meats and other protein foods: Beans. Almonds. Sunflower seeds. Pine nuts. Peanuts. [place]. Salmon. Scallops. Shrimp. [place]. Tilapia. Clams. Oysters. Eggs.  Dairy: Low-fat milk. Cheese. Greek yogurt.  Beverages: Water. Red wine. Herbal tea.  Fats and oils: Extra virgin olive oil. Avocado oil. Grape seed oil.  Sweets and desserts: [place] yogurt with honey. Baked apples. Poached pears. Trail mix.  Seasoning and other foods: Basil. Cilantro. Coriander. Cumin. Mint. Parsley. Sage. Rosemary. Tarragon. Garlic. Oregano. Thyme. Pepper. Balsalmic vinegar. Tahini. Hummus. Tomato sauce. Olives. Mushrooms. ? Limit these  Grains: Prepackaged pasta or rice dishes. Prepackaged cereal with added sugar.  Vegetables: Deep fried potatoes (french fries).  Fruits: Fruit canned in syrup.  Meats and other protein foods: Beef. Pork. Lamb. Poultry  with skin. Hot dogs. Berniece Salines.  Dairy: Ice cream. Sour cream. Whole milk.  Beverages: Juice. Sugar-sweetened soft drinks. Beer. Liquor and spirits.  Fats and oils: Butter. Canola oil. Vegetable oil. Beef fat (tallow). Lard.  Sweets and desserts: Cookies. Cakes. Pies. Candy.  Seasoning and other foods: Mayonnaise. Premade sauces and marinades. The items listed may not be a complete list. Talk with your dietitian about what dietary choices are right for you. Summary  The Mediterranean diet includes both food and lifestyle choices.  Eat a variety of fresh fruits and vegetables, beans, nuts, seeds, and whole grains.  Limit the amount of red meat and sweets that you eat.  Talk with your health care provider about whether it is safe for you to drink red wine in moderation. This means 1 glass a day for nonpregnant women and 2 glasses a day for men. A glass of  wine equals 5 oz (150 mL). This information is not intended to replace advice given to you by your health care provider. Make sure you discuss any questions you have with your health care provider. Document Revised: 08/29/2015 Document Reviewed: 08/22/2015 Elsevier Patient Education  [place].

## 2020-04-26 ENCOUNTER — Ambulatory Visit (INDEPENDENT_AMBULATORY_CARE_PROVIDER_SITE_OTHER): Payer: BC Managed Care – PPO | Admitting: Otolaryngology

## 2020-05-07 ENCOUNTER — Telehealth: Payer: Self-pay

## 2020-05-07 ENCOUNTER — Other Ambulatory Visit (HOSPITAL_COMMUNITY): Payer: Self-pay | Admitting: Emergency Medicine

## 2020-05-07 ENCOUNTER — Telehealth (HOSPITAL_COMMUNITY): Payer: Self-pay | Admitting: Emergency Medicine

## 2020-05-07 DIAGNOSIS — R072 Precordial pain: Secondary | ICD-10-CM

## 2020-05-07 MED ORDER — IVABRADINE HCL 5 MG PO TABS: 10.0000 mg | ORAL_TABLET | Freq: Once | ORAL | 0 refills | Status: AC

## 2020-05-07 NOTE — Telephone Encounter (Signed)
Attempted to call patient regarding upcoming cardiac CT appointment. °Left message on voicemail with name and callback number °Elishah Ashmore RN Navigator Cardiac Imaging °[facility] °[phone] Office °[phone] Cell ° °

## 2020-05-07 NOTE — Telephone Encounter (Signed)
**Note De-Identified Jacob Chamblee Obfuscation** I started a Corlanor 5 mg #2 tablets (take 2 tablets 2 hours prior to cadiac CT) PA through covermymeds. Key: [id number]

## 2020-05-08 NOTE — Telephone Encounter (Signed)
**Note De-Identified Earle Burson Obfuscation** Letter received from [facility] stating that they denied coverage for 2 tablets of Corlanor 5 mg. Reason: The request does not meet the definition of medical necessity found in the members benefit booklet.  I have called [facility] and left a detailed message on the pharmacy VM (I called during the pharmacist's lunch break). I did advise in the message that the pts ins will not cover 2 tablets of Corlanor 5 mg and that normally the pt pays out of pocket for this amount of tablets. I requested that they call Jeani Hawking back at Dr Landis Gandy office at [phone] if there is a cost issue when the pt picks his RX up.

## 2020-05-09 ENCOUNTER — Ambulatory Visit (HOSPITAL_COMMUNITY)
Admission: RE | Admit: 2020-05-09 | Discharge: 2020-05-09 | Disposition: A | Discharge status: Home / Self Care | Payer: BC Managed Care – PPO | Source: Ambulatory Visit | Attending: Cardiology | Admitting: Cardiology

## 2020-05-13 ENCOUNTER — Telehealth (HOSPITAL_COMMUNITY): Payer: Self-pay | Admitting: *Deleted

## 2020-05-13 NOTE — Telephone Encounter (Signed)
Reaching out to patient to offer assistance regarding upcoming cardiac imaging study; pt verbalizes understanding of appt date/time, parking situation and where to check in, pre-test NPO status and medications ordered, and verified current allergies; name and call back number provided for further questions should they arise  Gordy Clement RN Navigator Cardiac Imaging [facility] [phone] office [phone] cell  Pt to take 100mg  metoprolol tartrate and 10mg  ivabradine 2 hours prior to cardiac CT scan.

## 2020-05-14 ENCOUNTER — Other Ambulatory Visit: Payer: Self-pay

## 2020-05-14 ENCOUNTER — Ambulatory Visit (HOSPITAL_BASED_OUTPATIENT_CLINIC_OR_DEPARTMENT_OTHER): Payer: BC Managed Care – PPO

## 2020-05-14 ENCOUNTER — Other Ambulatory Visit: Payer: BC Managed Care – PPO | Admitting: *Deleted

## 2020-05-14 ENCOUNTER — Encounter (HOSPITAL_COMMUNITY): Payer: Self-pay

## 2020-05-14 ENCOUNTER — Ambulatory Visit (HOSPITAL_COMMUNITY)
Admission: RE | Admit: 2020-05-14 | Discharge: 2020-05-14 | Disposition: A | Discharge status: Home / Self Care | Payer: BC Managed Care – PPO | Source: Ambulatory Visit | Attending: Cardiology | Admitting: Cardiology

## 2020-05-14 DIAGNOSIS — R0602 Shortness of breath: Secondary | ICD-10-CM | POA: Insufficient documentation

## 2020-05-14 DIAGNOSIS — E782 Mixed hyperlipidemia: Secondary | ICD-10-CM | POA: Diagnosis not present

## 2020-05-14 DIAGNOSIS — R072 Precordial pain: Secondary | ICD-10-CM

## 2020-05-14 DIAGNOSIS — Z006 Encounter for examination for normal comparison and control in clinical research program: Secondary | ICD-10-CM

## 2020-05-14 LAB — POCT I-STAT CREATININE: Creatinine, Ser: 0.7 mg/dL (ref 0.61–1.24)

## 2020-05-14 LAB — BASIC METABOLIC PANEL
BUN/Creatinine Ratio: 22 — ABNORMAL HIGH (ref 9–20)
BUN: 19 mg/dL (ref 6–24)
CO2: 20 mmol/L (ref 20–29)
Calcium: 9 mg/dL (ref 8.7–10.2)
Chloride: 100 mmol/L (ref 96–106)
Creatinine, Ser: 0.87 mg/dL (ref 0.76–1.27)
Glucose: 210 mg/dL — ABNORMAL HIGH (ref 65–99)
Potassium: 4.7 mmol/L (ref 3.5–5.2)
Sodium: 137 mmol/L (ref 134–144)
eGFR: 103 mL/min/{1.73_m2} (ref >59)

## 2020-05-14 LAB — ECHOCARDIOGRAM COMPLETE
Area-P 1/2: 2.87 cm2
S' Lateral: 3 cm

## 2020-05-14 LAB — LIPID PANEL
Chol/HDL Ratio: 5 ratio (ref 0.0–5.0)
Cholesterol, Total: 136 mg/dL (ref 100–199)
HDL: 27 mg/dL — ABNORMAL LOW (ref >39)
LDL Chol Calc (NIH): 78 mg/dL (ref 0–99)
Triglycerides: 179 mg/dL — ABNORMAL HIGH (ref 0–149)
VLDL Cholesterol Cal: 31 mg/dL (ref 5–40)

## 2020-05-14 MED ORDER — NITROGLYCERIN 0.4 MG SL SUBL
0.8000 mg | SUBLINGUAL_TABLET | Freq: Once | SUBLINGUAL | Status: AC
  Administered 2020-05-14: 0.8 mg via SUBLINGUAL

## 2020-05-14 MED ORDER — DILTIAZEM HCL 25 MG/5ML IV SOLN
10.0000 mg | INTRAVENOUS | Status: DC | PRN
  Filled 2020-05-14: qty 5

## 2020-05-14 MED ORDER — DILTIAZEM HCL 25 MG/5ML IV SOLN
INTRAVENOUS | Status: AC
  Administered 2020-05-14: 10 mg via INTRAVENOUS
  Filled 2020-05-14: qty 5

## 2020-05-14 MED ORDER — IOHEXOL 350 MG/ML SOLN
95.0000 mL | Freq: Once | INTRAVENOUS | Status: AC | PRN
  Administered 2020-05-14: 95 mL via INTRAVENOUS

## 2020-05-14 MED ORDER — PAROXETINE HCL 20 MG PO TABS: 20.0000 mg | ORAL_TABLET | Freq: Every day | ORAL | 3 refills | Status: AC

## 2020-05-14 MED ORDER — METOPROLOL TARTRATE 5 MG/5ML IV SOLN: 5.0000 mg | INTRAVENOUS | Status: DC | PRN

## 2020-05-14 MED ORDER — METOPROLOL TARTRATE 5 MG/5ML IV SOLN
INTRAVENOUS | Status: AC
  Filled 2020-05-14: qty 10

## 2020-05-14 MED ORDER — METOPROLOL TARTRATE 5 MG/5ML IV SOLN
INTRAVENOUS | Status: AC
  Administered 2020-05-14: 10 mg via INTRAVENOUS
  Filled 2020-05-14: qty 10

## 2020-05-14 MED ORDER — METOPROLOL TARTRATE 5 MG/5ML IV SOLN
10.0000 mg | INTRAVENOUS | Status: DC | PRN
  Administered 2020-05-14: 10 mg via INTRAVENOUS

## 2020-05-14 NOTE — Research (Signed)
IDENTIFY Informed Consent                  Subject Name: Chad Haynes    Subject met inclusion and exclusion criteria.  The informed consent form, study requirements and expectations were reviewed with the subject and questions and concerns were addressed prior to the signing of the consent form.  The subject verbalized understanding of the trial requirements.  The subject agreed to participate in the IDENTIFY trial and signed the informed consent at 13:42PM on 05/14/20.  The informed consent was obtained prior to performance of any protocol-specific procedures for the subject.  A copy of the signed informed consent was given to the subject and a copy was placed in the subject's medical record.   Meade Maw , Naval architect

## 2020-05-15 ENCOUNTER — Telehealth: Payer: Self-pay | Admitting: *Deleted

## 2020-05-15 DIAGNOSIS — Z79899 Other long term (current) drug therapy: Secondary | ICD-10-CM

## 2020-05-15 DIAGNOSIS — E1165 Type 2 diabetes mellitus with hyperglycemia: Secondary | ICD-10-CM

## 2020-05-15 DIAGNOSIS — E782 Mixed hyperlipidemia: Secondary | ICD-10-CM

## 2020-05-15 DIAGNOSIS — I2510 Atherosclerotic heart disease of native coronary artery without angina pectoris: Secondary | ICD-10-CM

## 2020-05-15 DIAGNOSIS — I25119 Atherosclerotic heart disease of native coronary artery with unspecified angina pectoris: Secondary | ICD-10-CM

## 2020-05-15 MED ORDER — ROSUVASTATIN CALCIUM 40 MG PO TABS: 40.0000 mg | ORAL_TABLET | Freq: Every day | ORAL | 1 refills | Status: DC

## 2020-05-15 NOTE — Telephone Encounter (Signed)
Pt aware of Coronary CT results and lab results per Dr. Johney Frame. Pt is aware to work on better diabetes control and to get into see his Endocrinologist for further management of this.  Pt is scheduled to see his Endocrinologist Rayetta Pigg, on 07/18/20. Pt also aware that we need to stop his simvastatin and switch him to rosuvastatin 40 mg po daily, and recheck his lipids in 6-8 weeks.  He is also aware to start taking ASA 81 mg po daily, if he isn't already. Confirmed the pharmacy of choice with the pt. Repeat lipids scheduled at our office for 06/26/20.  He is aware to come fasting to this lab appt.  Reiterated to the pt the importance of good diabetes control, and compliance with showing up to see his Endocrinologist, as scheduled. Pt verbalized understanding and agrees with this plan.

## 2020-05-15 NOTE — Telephone Encounter (Signed)
-----   Message from Freada Bergeron, MD sent at 05/15/2020 12:37 PM EDT ----- His coronary CTA shows mild, non-obstructive disease. This is great in that there is no blockage to blood flow. We really need to work on reducing his cholesterol as it is still above goal and controlling his diabetes to prevent his disease from progressing. Can we change him from simvastatin 80mg  daily to crestor 40mg  daily and ensure he has plans to follow-up with endocrinology? I would also have him start a baby ASA if he is not already on it.   Thank you so much!

## 2020-05-15 NOTE — Telephone Encounter (Signed)
Freada Bergeron, MD  Nuala Alpha, LPN His kidney function looks good. We need to work on his diabetes control, but we referred to endocrine. Also plan to switch him from simva to crestor 40mg  with repeat lipids in 6-8 weeks. Goal LDL<70 given coronary CTA findings.

## 2020-05-17 ENCOUNTER — Other Ambulatory Visit: Payer: Self-pay | Admitting: *Deleted

## 2020-05-17 DIAGNOSIS — E1165 Type 2 diabetes mellitus with hyperglycemia: Secondary | ICD-10-CM

## 2020-05-17 DIAGNOSIS — IMO0002 Reserved for concepts with insufficient information to code with codable children: Secondary | ICD-10-CM

## 2020-05-17 MED ORDER — "BD VEO INSULIN SYRINGE U/F 31G X 15/64"" 0.3 ML MISC": 1 refills | Status: DC

## 2020-05-21 ENCOUNTER — Encounter (INDEPENDENT_AMBULATORY_CARE_PROVIDER_SITE_OTHER): Payer: Self-pay | Admitting: Otolaryngology

## 2020-05-21 ENCOUNTER — Ambulatory Visit (INDEPENDENT_AMBULATORY_CARE_PROVIDER_SITE_OTHER): Payer: BC Managed Care – PPO | Admitting: Otolaryngology

## 2020-05-21 ENCOUNTER — Other Ambulatory Visit: Payer: Self-pay

## 2020-05-21 VITALS — Temp 97.9°F

## 2020-05-21 DIAGNOSIS — D164 Benign neoplasm of bones of skull and face: Secondary | ICD-10-CM

## 2020-05-21 DIAGNOSIS — J3489 Other specified disorders of nose and nasal sinuses: Secondary | ICD-10-CM | POA: Diagnosis not present

## 2020-05-21 DIAGNOSIS — H903 Sensorineural hearing loss, bilateral: Secondary | ICD-10-CM | POA: Diagnosis not present

## 2020-05-21 DIAGNOSIS — J342 Deviated nasal septum: Secondary | ICD-10-CM | POA: Diagnosis not present

## 2020-05-21 DIAGNOSIS — H9313 Tinnitus, bilateral: Secondary | ICD-10-CM | POA: Diagnosis not present

## 2020-05-21 NOTE — Progress Notes (Signed)
HPI: Chad Haynes is a 55 y.o. male who presents is referred by Dr. Buelah Manis for evaluation of nasal obstruction as well as a lesion in his right ear canal.  He has noticed decline in his hearing and is thinking about getting hearing aids.  He had previously been followed by Dr. Ernesto Rutherford several years ago and was noted to have a lesion in the right ear canal that was partially obstructing the ear canal.  He was also noted to have a deviated septum and Dr. Ernesto Rutherford talked with him previously about possible surgery to help improve his nasal breathing.  He complains of chronic nasal obstruction but is breathing a little bit better today.  He has previously tried nasal steroid sprays without much benefit.  Past Medical History:  Diagnosis Date  . Depression   . Diabetes mellitus without complication ([place])   . Diabetes mellitus, type II ([place])   . GERD (gastroesophageal reflux disease)   . H. pylori infection   . HTN (hypertension)   . Hyperlipidemia   . Hypogonadism male   . Leg pain   . Low magnesium levels   . Neck pain   . Rhinitis, allergic   . Vitamin B12 deficiency   . Vitamin D deficiency   . White coat hypertension    Past Surgical History:  Procedure Laterality Date  . KNEE ARTHROSCOPY Right    Social History   Socioeconomic History  . Marital status: Married    Spouse name: Not on file  . Number of children: Not on file  . Years of education: Not on file  . Highest education level: Not on file  Occupational History  . Not on file  Tobacco Use  . Smoking status: Former Smoker    Quit date: 06/08/1991    Years since quitting: 28.9  . Smokeless tobacco: Never Used  Vaping Use  . Vaping Use: Never used  Substance and Sexual Activity  . Alcohol use: Yes    Alcohol/week: 0.0 standard drinks    Comment: occasionally  . Drug use: No  . Sexual activity: Yes  Other Topics Concern  . Not on file  Social History Narrative   Married. No children. Does not formal exercise.    In school at [employer] full-time. Works part-time job at night.   "Has to walk a mile from the car to classic [employer]"   Social Determinants of Health   Financial Resource Strain: Not on file  Food Insecurity: Not on file  Transportation Needs: Not on file  Physical Activity: Not on file  Stress: Not on file  Social Connections: Not on file   Family History  Problem Relation Age of Onset  . COPD Mother        smoker  . Hypertension Father    No Known Allergies Prior to Admission medications   Medication Sig Start Date End Date Taking? Authorizing Provider  aspirin 81 MG tablet Take 1 tablet (81 mg total) by mouth daily. 05/07/16   Orlena Sheldon, PA-C  Continuous Blood Gluc Sensor (FREESTYLE LIBRE 14 DAY SENSOR) MISC Use as directed to monitor glucose continuously. Change sensor Q 14 days. Dx: E11.65. 03/11/20   Alycia Rossetti, MD  glipiZIDE (GLUCOTROL) 5 MG tablet TAKE 1 TABLET BY MOUTH TWICE A DAY BEFORE A MEAL 03/18/20   Alycia Rossetti, MD  Insulin Pen Needle (BD PEN NEEDLE NANO 2ND GEN) 32G X 4 MM MISC USE AS DIRECTED WITH INSULIN 02/22/20   Vic Blackbird  F, MD  Insulin Syringe-Needle U-100 (BD VEO INSULIN SYRINGE U/F) 31G X 15/64" 0.3 ML MISC USE WITH SLIDING SCALE INSULIN 05/17/20   Susy Frizzle, MD  LEVEMIR FLEXTOUCH 100 UNIT/ML FlexPen INJECT 20 UNITS IN THE MORNING AND 20 UNITS IN THE [place], SUBCUTANEOUSLY 04/09/20   Susy Frizzle, MD  lisinopril (ZESTRIL) 10 MG tablet Take 1 tablet (10 mg total) by mouth daily. 03/15/20   Alycia Rossetti, MD  metFORMIN (GLUCOPHAGE) 1000 MG tablet TAKE 1 TABLET (1,000 MG TOTAL) BY MOUTH 2 (TWO) TIMES DAILY WITH A MEAL 04/01/20   Susy Frizzle, MD  metoprolol tartrate (LOPRESSOR) 100 MG tablet Take 1 tablet 2 hours before procedure 04/10/20   Freada Bergeron, MD  NOVOLIN R 100 UNIT/ML injection SLIDING SCALE 150-200=2UNITS,201-250=4 U,251-300=6U,301-350=8U,351-400=10 U,401-450=12U & MAKE APPT 04/01/20   Susy Frizzle, MD   omeprazole (PRILOSEC) 40 MG capsule Take 1 capsule (40 mg total) by mouth daily. 12/18/16   Orlena Sheldon, PA-C  PARoxetine (PAXIL) 20 MG tablet Take 1 tablet (20 mg total) by mouth daily. 05/14/20   Susy Frizzle, MD  rosuvastatin (CRESTOR) 40 MG tablet Take 1 tablet (40 mg total) by mouth daily. 05/15/20   Freada Bergeron, MD     Positive ROS: Patient states that he is always had a fast heart rate and has had previous cardiac evaluation which has been negative.  All other systems have been reviewed and were otherwise negative with the exception of those mentioned in the HPI and as above.  Physical Exam: Constitutional: Alert, well-appearing, no acute distress Ears: External ears without lesions or tenderness.  Left ear canal and left TM are clear.  Right ear canal reveals a large osteoma obstructing two thirds of the ear canal.  He has some wax around the osteoma that was removed with a curette.  The TM otherwise appears clear. Nasal: External nose without lesions. Septum is deviated more to the left.  After decongesting the nose no nasal polyps or intranasal lesions noted otherwise.  He has moderate turbinate per trophy..  Oral: Lips and gums without lesions. Tongue and palate mucosa without lesions. Posterior oropharynx clear. Neck: No palpable adenopathy or masses Cardiac exam: Patient has tachycardia with heart rate of approximately 80-90. Respiratory: Breathing comfortably  Skin: No facial/neck lesions or rash noted.  Audiogram demonstrated a bilateral symmetric downsloping sensorineural hearing loss above 2000 frequency.  Hearing dropped to 50 dB at 4000 frequency.  He had type A tympanograms bilaterally.  SRT's were 15 dB bilaterally.  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  Plan: I discussed with him concerning use of nasal steroid sprays and  prescribed Nasacort 2 sprays each nostril at night as this should help relieve some of the nasal congestion.  He is more interested in having surgery to help improve his nasal breathing. Reviewed nasal surgery with him and that this will require nasal packing overnight.  We can remove the right ear canal osteoma at the same time. He is going out of town for 2 weeks at the end of June and we will plan on scheduling this when he returns in July. Surgery would be septoplasty with bilateral inferior turbinate reductions and removal of right ear canal osteoma.  General anesthesia 2 and half to 3 hours.   Radene Journey, MD   CC:

## 2020-05-28 DIAGNOSIS — M5032 Other cervical disc degeneration, mid-cervical region, unspecified level: Secondary | ICD-10-CM | POA: Diagnosis not present

## 2020-05-28 DIAGNOSIS — M5386 Other specified dorsopathies, lumbar region: Secondary | ICD-10-CM | POA: Diagnosis not present

## 2020-05-28 DIAGNOSIS — M9904 Segmental and somatic dysfunction of sacral region: Secondary | ICD-10-CM | POA: Diagnosis not present

## 2020-05-28 DIAGNOSIS — R519 Headache, unspecified: Secondary | ICD-10-CM | POA: Diagnosis not present

## 2020-05-30 ENCOUNTER — Other Ambulatory Visit (INDEPENDENT_AMBULATORY_CARE_PROVIDER_SITE_OTHER): Payer: Self-pay

## 2020-05-30 ENCOUNTER — Telehealth: Payer: Self-pay | Admitting: Cardiology

## 2020-05-30 NOTE — Telephone Encounter (Signed)
   [facility] Pre-operative Risk Assessment    Patient Name: Chad Haynes  DOB: 10-04-1965  MRN: [id number]   HEARTCARE STAFF: - Please ensure there is not already an duplicate clearance open for this procedure. - Under Visit Info/Reason for Call, type in Other and utilize the format Clearance MM/DD/YY or Clearance TBD. Do not use dashes or single digits. - If request is for dental extraction, please clarify the # of teeth to be extracted.  Request for surgical clearance:  1. What type of surgery is being performed? Sinus and Right Ear Surgery  2. When is this surgery scheduled? TBD  3. What type of clearance is required (medical clearance vs. Pharmacy clearance to hold med vs. Both)? Both   4. Are there any medications that need to be held prior to surgery and how long? Up to [place] what medications need to be held prior and for how long  5. Practice name and name of physician performing surgery? Dr. Radene Journey   6. What is the office phone number? [phone]   7.   What is the office fax number? [phone]  8.   Anesthesia type (None, local, MAC, general) ? General   Leah Newnam 05/30/2020, 2:01 PM  _________________________________________________________________   (provider comments below)

## 2020-05-30 NOTE — Telephone Encounter (Signed)
   Primary Cardiologist: Freada Bergeron, MD  Chart reviewed as part of pre-operative protocol coverage. Given past medical history and time since last visit, based on ACC/AHA guidelines, Chad Haynes would be at acceptable risk for the planned procedure without further cardiovascular testing.    I will route this recommendation to the requesting party via Epic fax function and remove from pre-op pool.  Please call with questions.  Jossie Ng. Shaunie Boehm NP-C    05/30/2020, 3:13 PM [facility] [address] Office [phone] Fax [phone]

## 2020-06-03 DIAGNOSIS — M5032 Other cervical disc degeneration, mid-cervical region, unspecified level: Secondary | ICD-10-CM | POA: Diagnosis not present

## 2020-06-03 DIAGNOSIS — R519 Headache, unspecified: Secondary | ICD-10-CM | POA: Diagnosis not present

## 2020-06-03 DIAGNOSIS — M5386 Other specified dorsopathies, lumbar region: Secondary | ICD-10-CM | POA: Diagnosis not present

## 2020-06-03 DIAGNOSIS — M9904 Segmental and somatic dysfunction of sacral region: Secondary | ICD-10-CM | POA: Diagnosis not present

## 2020-06-06 ENCOUNTER — Other Ambulatory Visit: Payer: Self-pay | Admitting: Family Medicine

## 2020-06-06 DIAGNOSIS — M5032 Other cervical disc degeneration, mid-cervical region, unspecified level: Secondary | ICD-10-CM | POA: Diagnosis not present

## 2020-06-06 DIAGNOSIS — R519 Headache, unspecified: Secondary | ICD-10-CM | POA: Diagnosis not present

## 2020-06-06 DIAGNOSIS — E1165 Type 2 diabetes mellitus with hyperglycemia: Secondary | ICD-10-CM

## 2020-06-06 DIAGNOSIS — M9904 Segmental and somatic dysfunction of sacral region: Secondary | ICD-10-CM | POA: Diagnosis not present

## 2020-06-06 DIAGNOSIS — M5386 Other specified dorsopathies, lumbar region: Secondary | ICD-10-CM | POA: Diagnosis not present

## 2020-06-24 ENCOUNTER — Telehealth (INDEPENDENT_AMBULATORY_CARE_PROVIDER_SITE_OTHER): Payer: Self-pay

## 2020-06-24 ENCOUNTER — Ambulatory Visit: Payer: BC Managed Care – PPO | Admitting: Family Medicine

## 2020-06-24 NOTE — Telephone Encounter (Signed)
I called and left message for pt to call me back so we can get his surgery scheduled and I have already gotten his cardiac clearance.

## 2020-06-26 ENCOUNTER — Other Ambulatory Visit: Payer: BC Managed Care – PPO

## 2020-07-15 ENCOUNTER — Other Ambulatory Visit: Payer: Self-pay | Admitting: Family Medicine

## 2020-07-15 DIAGNOSIS — IMO0002 Reserved for concepts with insufficient information to code with codable children: Secondary | ICD-10-CM

## 2020-07-18 ENCOUNTER — Ambulatory Visit: Payer: BC Managed Care – PPO | Admitting: Nurse Practitioner

## 2020-07-29 ENCOUNTER — Encounter (INDEPENDENT_AMBULATORY_CARE_PROVIDER_SITE_OTHER): Payer: Self-pay

## 2020-07-31 ENCOUNTER — Ambulatory Visit: Payer: BC Managed Care – PPO | Admitting: Nurse Practitioner

## 2020-08-08 ENCOUNTER — Other Ambulatory Visit: Payer: Self-pay | Admitting: Family Medicine

## 2020-08-08 DIAGNOSIS — E1165 Type 2 diabetes mellitus with hyperglycemia: Secondary | ICD-10-CM

## 2020-08-12 ENCOUNTER — Telehealth: Payer: Self-pay | Admitting: *Deleted

## 2020-08-12 DIAGNOSIS — Z006 Encounter for examination for normal comparison and control in clinical research program: Secondary | ICD-10-CM

## 2020-08-12 NOTE — Telephone Encounter (Signed)
I called patient for 90-day Identify Study phone call. Patient is doing well and still has occasional shortness of breath. I reminded patient I would call him in May 2023 for 1-year follow-up.

## 2020-08-12 NOTE — Progress Notes (Signed)
Patient's chart and most recent A1c 12.8 reviewed with Dr Doroteo Glassman, will check BMP, if OK then fine for [facility].

## 2020-08-13 ENCOUNTER — Telehealth (INDEPENDENT_AMBULATORY_CARE_PROVIDER_SITE_OTHER): Payer: Self-pay | Admitting: Otolaryngology

## 2020-08-13 NOTE — Telephone Encounter (Signed)
Called patient about rescheduling his surgery as he had to cancel his surgery that was scheduled on August 9 because of being out of town. He was rescheduled for September 23 on a Friday at [facility] day surgery for septoplasty, turbinate reductions and removal of right ear canal osteoma.

## 2020-08-14 ENCOUNTER — Ambulatory Visit: Payer: Self-pay | Admitting: Physician Assistant

## 2020-09-26 NOTE — Progress Notes (Signed)
Called patient to do pre-op phone call but pt states the procedure will be cancelled and that Dr. Pollie Friar office is aware.

## 2020-10-01 ENCOUNTER — Other Ambulatory Visit: Payer: Self-pay | Admitting: Family Medicine

## 2020-10-01 DIAGNOSIS — E1165 Type 2 diabetes mellitus with hyperglycemia: Secondary | ICD-10-CM

## 2020-10-01 DIAGNOSIS — IMO0002 Reserved for concepts with insufficient information to code with codable children: Secondary | ICD-10-CM

## 2020-10-03 ENCOUNTER — Other Ambulatory Visit: Payer: Self-pay | Admitting: *Deleted

## 2020-10-03 MED ORDER — LISINOPRIL 10 MG PO TABS: 10.0000 mg | ORAL_TABLET | Freq: Every day | ORAL | 3 refills | Status: DC

## 2020-10-04 ENCOUNTER — Ambulatory Visit (HOSPITAL_BASED_OUTPATIENT_CLINIC_OR_DEPARTMENT_OTHER): Admission: RE | Admit: 2020-10-04 | Payer: BC Managed Care – PPO | Source: Home / Self Care | Admitting: Otolaryngology

## 2020-10-04 ENCOUNTER — Encounter (HOSPITAL_BASED_OUTPATIENT_CLINIC_OR_DEPARTMENT_OTHER): Admission: RE | Payer: Self-pay | Source: Home / Self Care

## 2020-10-04 SURGERY — SEPTOPLASTY, NOSE, WITH NASAL TURBINATE REDUCTION
Anesthesia: General | Laterality: Right

## 2020-10-11 ENCOUNTER — Telehealth: Payer: Self-pay | Admitting: Family Medicine

## 2020-10-11 DIAGNOSIS — E1165 Type 2 diabetes mellitus with hyperglycemia: Secondary | ICD-10-CM

## 2020-10-11 MED ORDER — GLIPIZIDE 5 MG PO TABS: ORAL_TABLET | ORAL | 0 refills | Status: DC

## 2020-10-11 NOTE — Telephone Encounter (Signed)
glipiZIDE (GLUCOTROL) 5 MG tablet [[id number]]    Order Details Dose, Route, Frequency: As Directed  Dispense Quantity: 60 tablet Refills: 3   Note to Pharmacy: DX Code Needed  .       Sig: TAKE 1 TABLET BY MOUTH TWICE A DAY BEFORE A MEAL

## 2020-10-30 ENCOUNTER — Other Ambulatory Visit: Payer: Self-pay | Admitting: Family Medicine

## 2020-10-30 DIAGNOSIS — E1165 Type 2 diabetes mellitus with hyperglycemia: Secondary | ICD-10-CM

## 2020-11-15 ENCOUNTER — Other Ambulatory Visit: Payer: Self-pay | Admitting: *Deleted

## 2020-11-15 DIAGNOSIS — I2510 Atherosclerotic heart disease of native coronary artery without angina pectoris: Secondary | ICD-10-CM

## 2020-11-15 DIAGNOSIS — Z79899 Other long term (current) drug therapy: Secondary | ICD-10-CM

## 2020-11-15 DIAGNOSIS — E1165 Type 2 diabetes mellitus with hyperglycemia: Secondary | ICD-10-CM

## 2020-11-15 DIAGNOSIS — E782 Mixed hyperlipidemia: Secondary | ICD-10-CM

## 2020-11-15 DIAGNOSIS — I25119 Atherosclerotic heart disease of native coronary artery with unspecified angina pectoris: Secondary | ICD-10-CM

## 2020-11-15 MED ORDER — ROSUVASTATIN CALCIUM 40 MG PO TABS: 40.0000 mg | ORAL_TABLET | Freq: Every day | ORAL | 2 refills | Status: DC

## 2020-11-20 DIAGNOSIS — M9903 Segmental and somatic dysfunction of lumbar region: Secondary | ICD-10-CM | POA: Diagnosis not present

## 2020-11-20 DIAGNOSIS — M47816 Spondylosis without myelopathy or radiculopathy, lumbar region: Secondary | ICD-10-CM | POA: Diagnosis not present

## 2020-11-20 DIAGNOSIS — M9905 Segmental and somatic dysfunction of pelvic region: Secondary | ICD-10-CM | POA: Diagnosis not present

## 2020-11-20 DIAGNOSIS — M5137 Other intervertebral disc degeneration, lumbosacral region: Secondary | ICD-10-CM | POA: Diagnosis not present

## 2020-11-21 DIAGNOSIS — M9905 Segmental and somatic dysfunction of pelvic region: Secondary | ICD-10-CM | POA: Diagnosis not present

## 2020-11-21 DIAGNOSIS — M47816 Spondylosis without myelopathy or radiculopathy, lumbar region: Secondary | ICD-10-CM | POA: Diagnosis not present

## 2020-11-21 DIAGNOSIS — M5137 Other intervertebral disc degeneration, lumbosacral region: Secondary | ICD-10-CM | POA: Diagnosis not present

## 2020-11-21 DIAGNOSIS — M9903 Segmental and somatic dysfunction of lumbar region: Secondary | ICD-10-CM | POA: Diagnosis not present

## 2020-11-25 ENCOUNTER — Other Ambulatory Visit: Payer: Self-pay | Admitting: Family Medicine

## 2020-11-25 DIAGNOSIS — E1165 Type 2 diabetes mellitus with hyperglycemia: Secondary | ICD-10-CM

## 2020-11-27 DIAGNOSIS — M9903 Segmental and somatic dysfunction of lumbar region: Secondary | ICD-10-CM | POA: Diagnosis not present

## 2020-11-27 DIAGNOSIS — M47816 Spondylosis without myelopathy or radiculopathy, lumbar region: Secondary | ICD-10-CM | POA: Diagnosis not present

## 2020-11-27 DIAGNOSIS — M9905 Segmental and somatic dysfunction of pelvic region: Secondary | ICD-10-CM | POA: Diagnosis not present

## 2020-11-27 DIAGNOSIS — M5137 Other intervertebral disc degeneration, lumbosacral region: Secondary | ICD-10-CM | POA: Diagnosis not present

## 2020-12-03 DIAGNOSIS — M5137 Other intervertebral disc degeneration, lumbosacral region: Secondary | ICD-10-CM | POA: Diagnosis not present

## 2020-12-03 DIAGNOSIS — M9905 Segmental and somatic dysfunction of pelvic region: Secondary | ICD-10-CM | POA: Diagnosis not present

## 2020-12-03 DIAGNOSIS — M9903 Segmental and somatic dysfunction of lumbar region: Secondary | ICD-10-CM | POA: Diagnosis not present

## 2020-12-03 DIAGNOSIS — M47816 Spondylosis without myelopathy or radiculopathy, lumbar region: Secondary | ICD-10-CM | POA: Diagnosis not present

## 2020-12-09 ENCOUNTER — Telehealth: Payer: Self-pay | Admitting: Family Medicine

## 2020-12-09 DIAGNOSIS — E1165 Type 2 diabetes mellitus with hyperglycemia: Secondary | ICD-10-CM

## 2020-12-09 MED ORDER — GLIPIZIDE 5 MG PO TABS: ORAL_TABLET | ORAL | 0 refills | Status: DC

## 2020-12-09 NOTE — Telephone Encounter (Signed)
Pt has not been seen by Dr. Dennard Schaumann, pt needs TOC visit ASAP to continue med refills.  Rx sent to pharmacy for 1 month supply.  [facility] for pt to schedule OV - pt MUST keep appt once scheduled

## 2020-12-09 NOTE — Telephone Encounter (Signed)
Fax from [facility] requesting a refill on his glipiZIDE (GLUCOTROL) 5 MG tablet

## 2020-12-10 DIAGNOSIS — M47816 Spondylosis without myelopathy or radiculopathy, lumbar region: Secondary | ICD-10-CM | POA: Diagnosis not present

## 2020-12-10 DIAGNOSIS — M9905 Segmental and somatic dysfunction of pelvic region: Secondary | ICD-10-CM | POA: Diagnosis not present

## 2020-12-10 DIAGNOSIS — M9903 Segmental and somatic dysfunction of lumbar region: Secondary | ICD-10-CM | POA: Diagnosis not present

## 2020-12-10 DIAGNOSIS — M5137 Other intervertebral disc degeneration, lumbosacral region: Secondary | ICD-10-CM | POA: Diagnosis not present

## 2020-12-12 DIAGNOSIS — M9903 Segmental and somatic dysfunction of lumbar region: Secondary | ICD-10-CM | POA: Diagnosis not present

## 2020-12-12 DIAGNOSIS — M9905 Segmental and somatic dysfunction of pelvic region: Secondary | ICD-10-CM | POA: Diagnosis not present

## 2020-12-12 DIAGNOSIS — M47816 Spondylosis without myelopathy or radiculopathy, lumbar region: Secondary | ICD-10-CM | POA: Diagnosis not present

## 2020-12-12 DIAGNOSIS — M5137 Other intervertebral disc degeneration, lumbosacral region: Secondary | ICD-10-CM | POA: Diagnosis not present

## 2020-12-17 DIAGNOSIS — M9905 Segmental and somatic dysfunction of pelvic region: Secondary | ICD-10-CM | POA: Diagnosis not present

## 2020-12-17 DIAGNOSIS — M9903 Segmental and somatic dysfunction of lumbar region: Secondary | ICD-10-CM | POA: Diagnosis not present

## 2020-12-17 DIAGNOSIS — M5137 Other intervertebral disc degeneration, lumbosacral region: Secondary | ICD-10-CM | POA: Diagnosis not present

## 2020-12-17 DIAGNOSIS — M47816 Spondylosis without myelopathy or radiculopathy, lumbar region: Secondary | ICD-10-CM | POA: Diagnosis not present

## 2020-12-19 DIAGNOSIS — M5137 Other intervertebral disc degeneration, lumbosacral region: Secondary | ICD-10-CM | POA: Diagnosis not present

## 2020-12-19 DIAGNOSIS — M47816 Spondylosis without myelopathy or radiculopathy, lumbar region: Secondary | ICD-10-CM | POA: Diagnosis not present

## 2020-12-19 DIAGNOSIS — M9905 Segmental and somatic dysfunction of pelvic region: Secondary | ICD-10-CM | POA: Diagnosis not present

## 2020-12-19 DIAGNOSIS — M9903 Segmental and somatic dysfunction of lumbar region: Secondary | ICD-10-CM | POA: Diagnosis not present

## 2020-12-23 DIAGNOSIS — S335XXA Sprain of ligaments of lumbar spine, initial encounter: Secondary | ICD-10-CM | POA: Diagnosis not present

## 2020-12-23 DIAGNOSIS — M1711 Unilateral primary osteoarthritis, right knee: Secondary | ICD-10-CM | POA: Diagnosis not present

## 2020-12-24 DIAGNOSIS — M9905 Segmental and somatic dysfunction of pelvic region: Secondary | ICD-10-CM | POA: Diagnosis not present

## 2020-12-24 DIAGNOSIS — M5137 Other intervertebral disc degeneration, lumbosacral region: Secondary | ICD-10-CM | POA: Diagnosis not present

## 2020-12-24 DIAGNOSIS — M47816 Spondylosis without myelopathy or radiculopathy, lumbar region: Secondary | ICD-10-CM | POA: Diagnosis not present

## 2020-12-24 DIAGNOSIS — M9903 Segmental and somatic dysfunction of lumbar region: Secondary | ICD-10-CM | POA: Diagnosis not present

## 2021-01-15 ENCOUNTER — Other Ambulatory Visit: Payer: Self-pay | Admitting: Family Medicine

## 2021-01-15 DIAGNOSIS — E1165 Type 2 diabetes mellitus with hyperglycemia: Secondary | ICD-10-CM

## 2021-01-16 DIAGNOSIS — S335XXA Sprain of ligaments of lumbar spine, initial encounter: Secondary | ICD-10-CM | POA: Diagnosis not present

## 2021-01-16 DIAGNOSIS — S83241A Other tear of medial meniscus, current injury, right knee, initial encounter: Secondary | ICD-10-CM | POA: Diagnosis not present

## 2021-01-17 DIAGNOSIS — M5137 Other intervertebral disc degeneration, lumbosacral region: Secondary | ICD-10-CM | POA: Diagnosis not present

## 2021-01-17 DIAGNOSIS — M9905 Segmental and somatic dysfunction of pelvic region: Secondary | ICD-10-CM | POA: Diagnosis not present

## 2021-01-17 DIAGNOSIS — M9903 Segmental and somatic dysfunction of lumbar region: Secondary | ICD-10-CM | POA: Diagnosis not present

## 2021-01-17 DIAGNOSIS — M47816 Spondylosis without myelopathy or radiculopathy, lumbar region: Secondary | ICD-10-CM | POA: Diagnosis not present

## 2021-01-21 DIAGNOSIS — M25561 Pain in right knee: Secondary | ICD-10-CM | POA: Diagnosis not present

## 2021-01-23 DIAGNOSIS — M9905 Segmental and somatic dysfunction of pelvic region: Secondary | ICD-10-CM | POA: Diagnosis not present

## 2021-01-23 DIAGNOSIS — M47816 Spondylosis without myelopathy or radiculopathy, lumbar region: Secondary | ICD-10-CM | POA: Diagnosis not present

## 2021-01-23 DIAGNOSIS — M9903 Segmental and somatic dysfunction of lumbar region: Secondary | ICD-10-CM | POA: Diagnosis not present

## 2021-01-23 DIAGNOSIS — M5137 Other intervertebral disc degeneration, lumbosacral region: Secondary | ICD-10-CM | POA: Diagnosis not present

## 2021-01-27 DIAGNOSIS — S83241A Other tear of medial meniscus, current injury, right knee, initial encounter: Secondary | ICD-10-CM | POA: Diagnosis not present

## 2021-01-27 DIAGNOSIS — M47816 Spondylosis without myelopathy or radiculopathy, lumbar region: Secondary | ICD-10-CM | POA: Diagnosis not present

## 2021-01-27 DIAGNOSIS — M5137 Other intervertebral disc degeneration, lumbosacral region: Secondary | ICD-10-CM | POA: Diagnosis not present

## 2021-01-27 DIAGNOSIS — M9903 Segmental and somatic dysfunction of lumbar region: Secondary | ICD-10-CM | POA: Diagnosis not present

## 2021-01-27 DIAGNOSIS — M9905 Segmental and somatic dysfunction of pelvic region: Secondary | ICD-10-CM | POA: Diagnosis not present

## 2021-01-30 DIAGNOSIS — M9903 Segmental and somatic dysfunction of lumbar region: Secondary | ICD-10-CM | POA: Diagnosis not present

## 2021-01-30 DIAGNOSIS — M5137 Other intervertebral disc degeneration, lumbosacral region: Secondary | ICD-10-CM | POA: Diagnosis not present

## 2021-01-30 DIAGNOSIS — M47816 Spondylosis without myelopathy or radiculopathy, lumbar region: Secondary | ICD-10-CM | POA: Diagnosis not present

## 2021-01-30 DIAGNOSIS — M9905 Segmental and somatic dysfunction of pelvic region: Secondary | ICD-10-CM | POA: Diagnosis not present

## 2021-02-03 DIAGNOSIS — M9905 Segmental and somatic dysfunction of pelvic region: Secondary | ICD-10-CM | POA: Diagnosis not present

## 2021-02-03 DIAGNOSIS — M47816 Spondylosis without myelopathy or radiculopathy, lumbar region: Secondary | ICD-10-CM | POA: Diagnosis not present

## 2021-02-03 DIAGNOSIS — M5137 Other intervertebral disc degeneration, lumbosacral region: Secondary | ICD-10-CM | POA: Diagnosis not present

## 2021-02-03 DIAGNOSIS — S83241D Other tear of medial meniscus, current injury, right knee, subsequent encounter: Secondary | ICD-10-CM | POA: Diagnosis not present

## 2021-02-03 DIAGNOSIS — E119 Type 2 diabetes mellitus without complications: Secondary | ICD-10-CM | POA: Diagnosis not present

## 2021-02-03 DIAGNOSIS — M9903 Segmental and somatic dysfunction of lumbar region: Secondary | ICD-10-CM | POA: Diagnosis not present

## 2021-02-03 LAB — HEMOGLOBIN A1C: Hemoglobin A1C: 12.8

## 2021-02-04 ENCOUNTER — Telehealth (HOSPITAL_BASED_OUTPATIENT_CLINIC_OR_DEPARTMENT_OTHER): Payer: Self-pay | Admitting: Cardiology

## 2021-02-04 NOTE — Telephone Encounter (Signed)
° °  Pre-operative Risk Assessment    Patient Name: Chad Haynes  DOB: Dec 02, 1965 MRN: [id number]      Request for Surgical Clearance    Procedure:   Right Knee Scope Menisectomy  Date of Surgery:  Clearance TBD                                 Surgeon:  Edmonia Lynch, MD Surgeon's Group or Practice Name:  [facility] Phone number:  [phone]. [phone] Fax number:  [phone]   Type of Clearance Requested:   - Medical    Type of Anesthesia:  Not Indicated   Additional requests/questions:  Please fax a copy of recent ov notes, labs, EKGs, or special studies to the surgeon's office.  Barbaraann Faster   02/04/2021, 2:12 PM

## 2021-02-05 NOTE — Telephone Encounter (Signed)
° °  Name: Chad Haynes  DOB: Apr 01, 1965  MRN: [id number]   Primary Cardiologist: Freada Bergeron, MD  Chart reviewed as part of pre-operative protocol coverage. Patient was contacted 02/05/2021 in reference to pre-operative risk assessment for pending surgery as outlined below.  Chad Haynes was last seen in 03/2020 by Dr. Johney Frame.  Since that day, Chad Haynes has done well.  He had a coronary CTA that showed mild nonobstructive CAD and reassuring echocardiogram.  He can complete 4.0 METS without angina.   Therefore, based on ACC/AHA guidelines, the patient would be at acceptable risk for the planned procedure without further cardiovascular testing.   The patient was advised that if he develops new symptoms prior to surgery to contact our office to arrange for a follow-up visit, and he verbalized understanding.  I will route this recommendation to the requesting party via Epic fax function and remove from pre-op pool. Please call with questions.  Tami Lin Jeremih Dearmas, PA 02/05/2021, 10:01 AM

## 2021-02-10 ENCOUNTER — Ambulatory Visit: Payer: BC Managed Care – PPO | Admitting: Nurse Practitioner

## 2021-02-10 NOTE — Patient Instructions (Incomplete)

## 2021-02-15 ENCOUNTER — Other Ambulatory Visit: Payer: Self-pay | Admitting: Family Medicine

## 2021-02-17 DIAGNOSIS — M5137 Other intervertebral disc degeneration, lumbosacral region: Secondary | ICD-10-CM | POA: Diagnosis not present

## 2021-02-17 DIAGNOSIS — M47816 Spondylosis without myelopathy or radiculopathy, lumbar region: Secondary | ICD-10-CM | POA: Diagnosis not present

## 2021-02-17 DIAGNOSIS — M9905 Segmental and somatic dysfunction of pelvic region: Secondary | ICD-10-CM | POA: Diagnosis not present

## 2021-02-17 DIAGNOSIS — M9903 Segmental and somatic dysfunction of lumbar region: Secondary | ICD-10-CM | POA: Diagnosis not present

## 2021-02-18 ENCOUNTER — Ambulatory Visit: Payer: BC Managed Care – PPO | Admitting: Nurse Practitioner

## 2021-02-20 ENCOUNTER — Other Ambulatory Visit: Payer: Self-pay | Admitting: Family Medicine

## 2021-02-20 DIAGNOSIS — E1165 Type 2 diabetes mellitus with hyperglycemia: Secondary | ICD-10-CM

## 2021-02-21 ENCOUNTER — Telehealth: Payer: Self-pay | Admitting: Nurse Practitioner

## 2021-02-21 DIAGNOSIS — E1165 Type 2 diabetes mellitus with hyperglycemia: Secondary | ICD-10-CM

## 2021-02-21 MED ORDER — GLIPIZIDE 5 MG PO TABS: ORAL_TABLET | ORAL | 0 refills | Status: DC

## 2021-02-21 NOTE — Telephone Encounter (Signed)
Done, sent in limited refill as he needs to be seen for follow up.  Has appt scheduled next week.

## 2021-02-21 NOTE — Telephone Encounter (Signed)
Patient is requesting a [facility] [address]

## 2021-02-24 NOTE — Patient Instructions (Signed)

## 2021-02-25 ENCOUNTER — Encounter: Payer: Self-pay | Admitting: Nurse Practitioner

## 2021-02-25 ENCOUNTER — Other Ambulatory Visit: Payer: Self-pay

## 2021-02-25 ENCOUNTER — Other Ambulatory Visit: Payer: Self-pay | Admitting: *Deleted

## 2021-02-25 ENCOUNTER — Ambulatory Visit (INDEPENDENT_AMBULATORY_CARE_PROVIDER_SITE_OTHER): Payer: BC Managed Care – PPO | Admitting: Nurse Practitioner

## 2021-02-25 VITALS — BP 136/78 | HR 91 | Ht 70.25 in | Wt 236.8 lb

## 2021-02-25 DIAGNOSIS — E782 Mixed hyperlipidemia: Secondary | ICD-10-CM | POA: Diagnosis not present

## 2021-02-25 DIAGNOSIS — Z794 Long term (current) use of insulin: Secondary | ICD-10-CM | POA: Diagnosis not present

## 2021-02-25 DIAGNOSIS — E1165 Type 2 diabetes mellitus with hyperglycemia: Secondary | ICD-10-CM | POA: Diagnosis not present

## 2021-02-25 DIAGNOSIS — I10 Essential (primary) hypertension: Secondary | ICD-10-CM | POA: Diagnosis not present

## 2021-02-25 DIAGNOSIS — Z0000 Encounter for general adult medical examination without abnormal findings: Secondary | ICD-10-CM | POA: Diagnosis not present

## 2021-02-25 DIAGNOSIS — Z6834 Body mass index (BMI) 34.0-34.9, adult: Secondary | ICD-10-CM | POA: Diagnosis not present

## 2021-02-25 DIAGNOSIS — E119 Type 2 diabetes mellitus without complications: Secondary | ICD-10-CM | POA: Diagnosis not present

## 2021-02-25 DIAGNOSIS — Z1331 Encounter for screening for depression: Secondary | ICD-10-CM | POA: Diagnosis not present

## 2021-02-25 DIAGNOSIS — Z1339 Encounter for screening examination for other mental health and behavioral disorders: Secondary | ICD-10-CM | POA: Diagnosis not present

## 2021-02-25 LAB — POCT UA - MICROALBUMIN
Creatinine, POC: 300 mg/dL
Microalbumin Ur, POC: 80 mg/L

## 2021-02-25 NOTE — Progress Notes (Signed)
Endocrinology Follow Up Note       02/25/2021, 10:24 AM   Subjective:    Patient ID: Chad Haynes, male    DOB: [birth year].  Chad Haynes is being seen in follow up after being seen in consultation for management of currently uncontrolled symptomatic diabetes requested by  [facility].   Past Medical History:  Diagnosis Date   Depression    Diabetes mellitus without complication ([place])    Diabetes mellitus, type II ([place])    GERD (gastroesophageal reflux disease)    H. pylori infection    HTN (hypertension)    Hyperlipidemia    Hypogonadism male    Leg pain    Low magnesium levels    Neck pain    Rhinitis, allergic    Vitamin B12 deficiency    Vitamin D deficiency    White coat hypertension     Past Surgical History:  Procedure Laterality Date   KNEE ARTHROSCOPY Right     Social History   Socioeconomic History   Marital status: Married    Spouse name: Not on file   Number of children: Not on file   Years of education: Not on file   Highest education level: Not on file  Occupational History   Not on file  Tobacco Use   Smoking status: Former    Types: Cigarettes    Quit date: 06/08/1991    Years since quitting: 29.7   Smokeless tobacco: Never  Vaping Use   Vaping Use: Never used  Substance and Sexual Activity   Alcohol use: Yes    Alcohol/week: 0.0 standard drinks    Comment: occasionally   Drug use: No   Sexual activity: Yes  Other Topics Concern   Not on file  Social History Narrative   Married. No children. Does not formal exercise.   In school at [employer] full-time. Works part-time job at night.   "Has to walk a mile from the car to classic [employer]"   Social Determinants of Health   Financial Resource Strain: Not on file  Food Insecurity: Not on file  Transportation Needs: Not on file  Physical Activity: Not on file  Stress: Not on file  Social Connections:  Not on file    Family History  Problem Relation Age of Onset   COPD Mother        smoker   Hypertension Father     Outpatient Encounter Medications as of 02/25/2021  Medication Sig   aspirin 81 MG tablet Take 1 tablet (81 mg total) by mouth daily.   BD PEN NEEDLE NANO 2ND GEN 32G X 4 MM MISC USE AS DIRECTED WITH INSULIN   Continuous Blood Gluc Sensor (FREESTYLE LIBRE 14 DAY SENSOR) MISC Use as directed to monitor glucose continuously. Change sensor Q 14 days. Dx: E11.65.   glipiZIDE (GLUCOTROL) 5 MG tablet TAKE 1 TABLET BY MOUTH TWICE A DAY BEFORE A MEAL/PT NEEDS OV FOR FURTHER REFILLS   Insulin Syringe-Needle U-100 (BD INSULIN SYRINGE U/F) 31G X 5/16" 0.3 ML MISC USE WITH SLIDING SCALE INSULIN   LEVEMIR FLEXTOUCH 100 UNIT/ML FlexTouch Pen INJECT 20 UNITS IN THE MORNING AND 20 UNITS IN  THE EVENING, SUBCUTANEOUSLY (Patient taking differently: Inject 50 Units into the skin at bedtime.)   lisinopril (ZESTRIL) 10 MG tablet Take 1 tablet (10 mg total) by mouth daily.   metFORMIN (GLUCOPHAGE) 1000 MG tablet TAKE 1 TABLET (1,000 MG TOTAL) BY MOUTH 2 (TWO) TIMES DAILY WITH A MEAL   NOVOLIN R 100 UNIT/ML injection SLIDING SCALE 150-200=2UNITS,201-250=4 U,251-300=6U,301-350=8U,351-400=10 U,401-450=12U & MAKE APPT   omeprazole (PRILOSEC) 40 MG capsule Take 1 capsule (40 mg total) by mouth daily.   PARoxetine (PAXIL) 20 MG tablet Take 1 tablet (20 mg total) by mouth daily.   QUICKVUE AT-HOME COVID-19 TEST KIT See admin instructions.   rosuvastatin (CRESTOR) 40 MG tablet Take 1 tablet (40 mg total) by mouth daily.   [DISCONTINUED] metoprolol tartrate (LOPRESSOR) 100 MG tablet Take 1 tablet 2 hours before procedure (Patient not taking: Reported on 02/25/2021)   No facility-administered encounter medications on file as of 02/25/2021.    ALLERGIES: No Known Allergies  VACCINATION STATUS: Immunization History  Administered Date(s) Administered   PFIZER(Purple Top)SARS-COV-2 Vaccination 04/13/2019,  05/13/2019, 12/17/2019   Pneumococcal Polysaccharide-23 01/17/2014   Tdap 05/07/2016    Diabetes He presents for his follow-up diabetic visit. He has type 2 diabetes mellitus. Onset time: was diagnosed at approx age of 56. His disease course has been stable. There are no hypoglycemic associated symptoms. Associated symptoms include blurred vision, fatigue, polydipsia and polyuria. There are no hypoglycemic complications. Symptoms are stable. Diabetic complications include impotence. 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 Current diabetic treatment includes intensive insulin program and oral agent (dual therapy). He is compliant with treatment some of the time (skips his Novolin R usually). His weight is decreasing steadily. He is following a generally unhealthy diet. When asked about meal planning, he reported none. He has had a previous visit with a dietitian (Did not find it beneficial). He rarely participates in exercise. His home blood glucose trend is fluctuating minimally. His overall blood glucose range is >200 mg/dl. (He presents today with his CGM showing limited data and no logs.  He admits he has not done well with his diabetes management, has not been taking his Novolin R routinely, forgets to grab his insulin pen from the house when he goes to work.  His most recent A1c was 12.8% on 1/23, unchanged from previous visit.  He is wanting to have knee surgery but cannot get clearance to do so until his A1c is less than 10%.) An ACE inhibitor/angiotensin II receptor blocker is being taken. He does not see a podiatrist.Eye exam is current.  Hyperlipidemia This is a chronic problem. The current episode started more than 1 year ago. The problem is controlled. Recent lipid tests were reviewed and  are normal. Exacerbating diseases include diabetes and obesity. Factors aggravating his hyperlipidemia include fatty foods. Current antihyperlipidemic treatment includes statins. The current treatment provides moderate improvement of lipids. Compliance problems include adherence to diet and adherence to exercise.  Risk factors for coronary artery disease include diabetes mellitus, dyslipidemia, hypertension, male sex, obesity and a sedentary lifestyle.  Hypertension This is a chronic problem. The current episode started more than 1 year ago. The problem has been gradually improving since onset. The problem is controlled. Associated symptoms include blurred vision. There are no associated agents to hypertension. Risk factors for coronary artery disease include diabetes mellitus, dyslipidemia, family history, male  gender, obesity and sedentary lifestyle. Past treatments include ACE inhibitors. The current treatment provides mild improvement. Compliance problems include diet and exercise.     Review of systems  Constitutional: + Minimally fluctuating body weight,  current Body mass index is 33.74 kg/m. , + fatigue, no subjective hyperthermia, no subjective hypothermia Eyes: + blurry vision, no xerophthalmia ENT: no sore throat, no nodules palpated in throat, no dysphagia/odynophagia, no hoarseness Cardiovascular: no chest pain, no shortness of breath, no palpitations, no leg swelling Respiratory: no cough, no shortness of breath Gastrointestinal: no nausea/vomiting/diarrhea Musculoskeletal: right knee pain- awaiting clearance for surgery Skin: no rashes, no hyperemia Neurological: no tremors, no numbness, no tingling, no dizziness Psychiatric: no depression, no anxiety  Objective:     BP 136/78    Pulse 91    Ht 5' 10.25" (1.784 m)    Wt 236 lb 12.8 oz (107.4 kg)    SpO2 98%    BMI 33.74 kg/m   Wt Readings from Last 3 Encounters:  02/25/21 236 lb 12.8 oz (107.4 kg)  04/10/20 241 lb 3.2 oz  (109.4 kg)  04/09/20 241 lb (109.3 kg)     BP Readings from Last 3 Encounters:  02/25/21 136/78  05/14/20 117/69  04/10/20 138/84     Physical Exam- Limited  Constitutional:  Body mass index is 33.74 kg/m. , not in acute distress, normal state of mind Eyes:  EOMI, no exophthalmos Neck: Supple Cardiovascular: RRR, no murmurs, rubs, or gallops, no edema Respiratory: Adequate breathing efforts, no crackles, rales, rhonchi, or wheezing Musculoskeletal: no gross deformities, strength intact in all four extremities, no gross restriction of joint movements Skin:  no rashes, no hyperemia Neurological: no tremor with outstretched hands     CMP ( most recent) CMP     Component Value Date/Time   NA 137 05/14/2020 0822   K 4.7 05/14/2020 0822   CL 100 05/14/2020 0822   CO2 20 05/14/2020 0822   GLUCOSE 210 (H) 05/14/2020 0822   GLUCOSE 283 (H) 03/15/2020 1159   BUN 19 05/14/2020 0822   CREATININE 0.70 05/14/2020 1437   CREATININE 0.81 03/15/2020 1159   CALCIUM 9.0 05/14/2020 0822   PROT 7.3 02/27/2019 1602   ALBUMIN 4.3 05/07/2016 0940   AST 15 02/27/2019 1602   ALT 15 02/27/2019 1602   ALKPHOS 91 05/07/2016 0940   BILITOT 0.5 02/27/2019 1602   GFRNONAA >60 02/28/2019 0927   GFRNONAA 75 02/27/2019 1602   GFRAA >60 02/28/2019 0927   GFRAA 87 02/27/2019 1602     Diabetic Labs (most recent): Lab Results  Component Value Date   HGBA1C 12.8 02/03/2021   HGBA1C 12.8 (H) 03/15/2020   HGBA1C 13.9 (H) 07/13/2019     Lipid Panel ( most recent) Lipid Panel     Component Value Date/Time   CHOL 136 05/14/2020 0822   TRIG 179 (H) 05/14/2020 0822   HDL 27 (L) 05/14/2020 0822   CHOLHDL 5.0 05/14/2020 0822   CHOLHDL 7.6 (H) 02/27/2019 1602   VLDL 38 (H) 05/07/2016 0940   LDLCALC 78 05/14/2020 0822   LDLCALC  02/27/2019 1602     Comment:     . LDL cholesterol not calculated. Triglyceride levels greater than 400 mg/dL invalidate calculated LDL results. . Reference range:  <100 . Desirable range <100 mg/dL for primary prevention;   <70 mg/dL for patients with CHD or diabetic patients  with > or = 2 CHD risk factors. Marland Kitchen LDL-C is now calculated using the Martin-Hopkins  calculation, which  is a validated novel method providing  better accuracy than the Friedewald equation in the  estimation of LDL-C.  Cresenciano Genre et al. Annamaria Helling. 9357;017(79): 2061-2068  (http://education.[facility].com/faq/FAQ164)    LDLDIRECT 68 03/15/2020 1159   LABVLDL 31 05/14/2020 0822      Lab Results  Component Value Date   TSH 0.92 03/10/2018   TSH 0.69 05/07/2016           Assessment & Plan:   1) Uncontrolled type 2 diabetes mellitus with hyperglycemia (HCC)  - Chad Haynes has currently uncontrolled symptomatic type 2 DM since 56 years of age.Marland Kitchen   He presents today with his CGM showing limited data and no logs.  He admits he has not done well with his diabetes management, has not been taking his Novolin R routinely, forgets to grab his insulin pen from the house when he goes to work.  His most recent A1c was 12.8% on 1/23, unchanged from previous visit.  He is wanting to have knee surgery but cannot get clearance to do so until his A1c is less than 10%.  -Recent labs reviewed.  His POCT UM shows mild microalbuminuria at 80 today.  - I had a long discussion with him about the progressive nature of diabetes and the pathology behind its complications. -his diabetes is not currently complicated but he remains at a high risk for more acute and chronic complications which include CAD, CVA, CKD, retinopathy, and neuropathy. These are all discussed in detail with him.  - Nutritional counseling repeated at each appointment due to patients tendency to fall back in to old habits.  - The patient admits there is a room for improvement in their diet and drink choices. -  Suggestion is made for the patient to avoid simple carbohydrates from their diet including Cakes, Sweet  Desserts / Pastries, Ice Cream, Soda (diet and regular), Sweet Tea, Candies, Chips, Cookies, Sweet Pastries, Store Bought Juices, Alcohol in Excess of 1-2 drinks a day, Artificial Sweeteners, Coffee Creamer, and "Sugar-free" Products. This will help patient to have stable blood glucose profile and potentially avoid unintended weight gain.   - I encouraged the patient to switch to unprocessed or minimally processed complex starch and increased protein intake (animal or plant source), fruits, and vegetables.   - Patient is advised to stick to a routine mealtimes to eat 3 meals a day and avoid unnecessary snacks (to snack only to correct hypoglycemia).  - I have approached him with the following individualized plan to manage  his diabetes and patient agrees:   -Given his lack of logs to review, no changes will be made to his medications for safety purposes.  His is advised to be more consistent with taking his medications including his Levemir 50 units SQ nightly, Novolin R 5-11 units TID with meals if glucose is above 90 and he is eating (Specific instructions on how to titrate insulin dosage based on glucose readings given to patient in writing), Metformin 1000 mg po twice daily with meals and Glipizide 5 mg po twice daily with meals.   -he is encouraged to use his CGM to continue monitoring blood glucose 4 times daily-top be more consistent with scanning, before meals and before bed, and to call the clinic if he has readings less than 70 or greater than 300 for 3 tests in a row.  - he is warned not to take insulin without proper monitoring per orders. - Adjustment parameters are given to him for hypo and hyperglycemia  in writing.  - he will be considered for incretin therapy as appropriate next visit.  - Specific targets for  A1c;  LDL, HDL,  and Triglycerides were discussed with the patient.  2) Blood Pressure /Hypertension:  his blood pressure is controlled to target. There is documentation of  white coat syndrome.  he is advised to continue his current medications including Lisinopril 10 mg p.o. daily with breakfast.  3) Lipids/Hyperlipidemia:    Review of his recent lipid panel from 03/15/20 showed controlled LDL at 68 .  he  is advised to continue Simvastatin 80 mg daily at bedtime.  Side effects and precautions discussed with him.  He has an appointment with his new PCP today and he thinks they will do labs, will ask for copy for our records.  4)  Weight/Diet:  his Body mass index is 33.74 kg/m.  -  clearly complicating his diabetes care.   he is candidate for weight loss. I discussed with him the fact that loss of 5 - 10% of his  current body weight will have the most impact on his diabetes management.  Exercise, and detailed carbohydrates information provided  -  detailed on discharge instructions.  5) Chronic Care/Health Maintenance: -he is on ACEI/ARB and Statin medications and is encouraged to initiate and continue to follow up with Ophthalmology, Dentist, Podiatrist at least yearly or according to recommendations, and advised to stay away from smoking. I have recommended yearly flu vaccine and pneumonia vaccine at least every 5 years; moderate intensity exercise for up to 150 minutes weekly; and sleep for at least 7 hours a day.  - he is advised to maintain close follow up with [facility] for primary care needs, as well as his other providers for optimal and coordinated care.     I spent 43 minutes in the care of the patient today including review of labs from [place], Lipids, Thyroid Function, Hematology (current and previous including abstractions from other facilities); face-to-face time discussing  his blood glucose readings/logs, discussing hypoglycemia and hyperglycemia episodes and symptoms, medications doses, his options of short and long term treatment based on the latest standards of care / guidelines;  discussion about incorporating lifestyle medicine;  and  documenting the encounter.    Please refer to Patient Instructions for Blood Glucose Monitoring and Insulin/Medications Dosing Guide"  in media tab for additional information. Please  also refer to " Patient Self Inventory" in the Media  tab for reviewed elements of pertinent patient history.  Chad Haynes participated in the discussions, expressed understanding, and voiced agreement with the above plans.  All questions were answered to his satisfaction. he is encouraged to contact clinic should he have any questions or concerns prior to his return visit.   Follow up plan: - Return in about 1 month (around 03/25/2021) for Diabetes F/U, Bring meter and logs, No previsit labs.  Rayetta Pigg, [facility] [address] [place], [place] [address] Phone: [phone] Fax: [phone]  02/25/2021, 10:24 AM

## 2021-02-26 DIAGNOSIS — Z794 Long term (current) use of insulin: Secondary | ICD-10-CM | POA: Diagnosis not present

## 2021-02-26 DIAGNOSIS — E119 Type 2 diabetes mellitus without complications: Secondary | ICD-10-CM | POA: Diagnosis not present

## 2021-02-26 DIAGNOSIS — Z125 Encounter for screening for malignant neoplasm of prostate: Secondary | ICD-10-CM | POA: Diagnosis not present

## 2021-02-26 DIAGNOSIS — Z1159 Encounter for screening for other viral diseases: Secondary | ICD-10-CM | POA: Diagnosis not present

## 2021-02-26 DIAGNOSIS — E291 Testicular hypofunction: Secondary | ICD-10-CM | POA: Diagnosis not present

## 2021-02-27 DIAGNOSIS — M9903 Segmental and somatic dysfunction of lumbar region: Secondary | ICD-10-CM | POA: Diagnosis not present

## 2021-02-27 DIAGNOSIS — M9905 Segmental and somatic dysfunction of pelvic region: Secondary | ICD-10-CM | POA: Diagnosis not present

## 2021-02-27 DIAGNOSIS — M47816 Spondylosis without myelopathy or radiculopathy, lumbar region: Secondary | ICD-10-CM | POA: Diagnosis not present

## 2021-02-27 DIAGNOSIS — M5137 Other intervertebral disc degeneration, lumbosacral region: Secondary | ICD-10-CM | POA: Diagnosis not present

## 2021-03-05 DIAGNOSIS — M47816 Spondylosis without myelopathy or radiculopathy, lumbar region: Secondary | ICD-10-CM | POA: Diagnosis not present

## 2021-03-05 DIAGNOSIS — M9905 Segmental and somatic dysfunction of pelvic region: Secondary | ICD-10-CM | POA: Diagnosis not present

## 2021-03-05 DIAGNOSIS — M5137 Other intervertebral disc degeneration, lumbosacral region: Secondary | ICD-10-CM | POA: Diagnosis not present

## 2021-03-05 DIAGNOSIS — M9903 Segmental and somatic dysfunction of lumbar region: Secondary | ICD-10-CM | POA: Diagnosis not present

## 2021-03-05 NOTE — Progress Notes (Unsigned)
Cardiology Office Note:    Date:  03/06/2021   ID:  Chad Haynes, DOB 14-Jun-1965, MRN [id number]  PCP:  [facility]   [facility] Providers Cardiologist:  Freada Bergeron, MD { Click to update primary MD,subspecialty MD or APP then REFRESH:1}    Referring MD: Susy Frizzle, MD   No chief complaint on file. ***  History of Present Illness:    Chad Haynes is a 56 y.o. male with a hx of HTN, hyperlipidemia, DM, GERD, and depression.   Initial cardiology encounter was on 04/10/20 with Dr. Johney Frame for evaluation of dyspnea on exertion referred by PCP.  He reported worsening shortness of breath over the previous year worsened by walking up stairs, inclines, or carrying heavy material.  He denied chest pressure, lightheadedness, dizziness, edema, orthopnea, PND, palpitations.  His father had CABG at age 55 and his paternal grandfather had massive MI at age 13. EKG at that visit demonstrated NSR at 99 bpm, borderline q waves in III, aVF. Advised to start aspirin. Coronary CT revealed calcium score of 169, 89th percentile for age sex and race matched, and mild nonobstructive CAD.  Echocardiogram showed LVEF 55 to 60%, no regional wall motion abnormalities, normal diastolic parameters, trivial MR, trivial AI, trivial TR, mild dilatation of aortic root at 39 mm. His statin intensity was increased from simvastatin to rosuvastatin 40 mg. A 6 month follow-up was recommended.   Today, he is here   Recheck lipids A1C 12.8 Research?  Past Medical History:  Diagnosis Date   Depression    Diabetes mellitus without complication ([place])    Diabetes mellitus, type II ([place])    GERD (gastroesophageal reflux disease)    H. pylori infection    HTN (hypertension)    Hyperlipidemia    Hypogonadism male    Leg pain    Low magnesium Haynes    Neck pain    Rhinitis, allergic    Vitamin B12 deficiency    Vitamin D deficiency    White coat hypertension     Past Surgical  History:  Procedure Laterality Date   KNEE ARTHROSCOPY Right     Current Medications: No outpatient medications have been marked as taking for the 03/06/21 encounter (Appointment) with Deberah Pelton, NP.     Allergies:   Patient has no known allergies.   Social History   Socioeconomic History   Marital status: Married    Spouse name: Not on file   Number of children: Not on file   Years of education: Not on file   Highest education level: Not on file  Occupational History   Not on file  Tobacco Use   Smoking status: Former    Types: Cigarettes    Quit date: 06/08/1991    Years since quitting: 29.7   Smokeless tobacco: Never  Vaping Use   Vaping Use: Never used  Substance and Sexual Activity   Alcohol use: Yes    Alcohol/week: 0.0 standard drinks    Comment: occasionally   Drug use: No   Sexual activity: Yes  Other Topics Concern   Not on file  Social History Narrative   Married. No children. Does not formal exercise.   In school at [employer] full-time. Works part-time job at night.   "Has to walk a mile from the car to classic [employer]"   Social Determinants of Health   Financial Resource Strain: Not on file  Food Insecurity: Not on file  Transportation Needs:  Not on file  Physical Activity: Not on file  Stress: Not on file  Social Connections: Not on file     Family History: The patient's ***family history includes COPD in his mother; Hypertension in his father.  ROS:   Please see the history of present illness.    *** All other systems reviewed and are negative.  Labs/Other Studies Reviewed:    The following studies were reviewed today:  Echo 05/14/20  Left Ventricle: Left ventricular ejection fraction, by estimation, is 55  to 60%. The left ventricle has normal function. The left ventricle has no  regional wall motion abnormalities. The left ventricular internal cavity  size was normal in size. There is  no left ventricular hypertrophy. Left ventricular  diastolic parameters  were normal.  Right Ventricle: The right ventricular size is normal. No increase in  right ventricular wall thickness. Right ventricular systolic function is  normal. Tricuspid regurgitation signal is inadequate for assessing PA  pressure.  Left Atrium: Left atrial size was normal in size.  Right Atrium: Right atrial size was normal in size.  Pericardium: Trivial pericardial effusion is present.  Mitral Valve: The mitral valve is grossly normal. Trivial mitral valve  regurgitation. No evidence of mitral valve stenosis.  Tricuspid Valve: The tricuspid valve is grossly normal. Tricuspid valve  regurgitation is trivial. No evidence of tricuspid stenosis.  Aortic Valve: The aortic valve is tricuspid. Aortic valve regurgitation is  trivial. No aortic stenosis is present.  Pulmonic Valve: The pulmonic valve was grossly normal. Pulmonic valve  regurgitation is not visualized. No evidence of pulmonic stenosis.  Aorta: Aortic dilatation noted. There is mild dilatation of the aortic  root, measuring 39 mm.  Venous: The inferior vena cava is normal in size with greater than 50%  respiratory variability, suggesting right atrial pressure of 3 mmHg.  IAS/Shunts: The atrial septum is grossly normal.    Coronary CT 05/14/20  IMPRESSION: 1. Coronary calcium score of 169. This was 89th percentile for age, sex, and race matched control.   2. Normal coronary origin with right dominance.   3. CAD-RADS 2. Mild non-obstructive CAD (25-49%). Consider non-atherosclerotic causes of chest pain. Consider preventive therapy and risk factor modification.   4. Sinus of Valsalva: Mild dilation at 41 mm. Consider secondary imaging modality (echocardiogram, CTA Aorta Protocol, or MRA Aorta Protocol) if clinically indicated.  Recent Labs: 03/15/2020: Hemoglobin 14.3; Platelets 352 05/14/2020: BUN 19; Creatinine, Ser 0.70; Potassium 4.7; Sodium 137   Recent Lipid Panel    Component Value  Date/Time   CHOL 136 05/14/2020 0822   TRIG 179 (H) 05/14/2020 0822   HDL 27 (L) 05/14/2020 0822   CHOLHDL 5.0 05/14/2020 0822   CHOLHDL 7.6 (H) 02/27/2019 1602   VLDL 38 (H) 05/07/2016 0940   LDLCALC 78 05/14/2020 0822   LDLCALC  02/27/2019 1602     Comment:     . LDL cholesterol not calculated. Triglyceride Haynes greater than 400 mg/dL invalidate calculated LDL results. . Reference range: <100 . Desirable range <100 mg/dL for primary prevention;   <70 mg/dL for patients with CHD or diabetic patients  with > or = 2 CHD risk factors. Marland Kitchen LDL-C is now calculated using the Martin-Hopkins  calculation, which is a validated novel method providing  better accuracy than the Friedewald equation in the  estimation of LDL-C.  Cresenciano Genre et al. Annamaria Helling. 4270;623(76): 2061-2068  (http://education.[employer].com/faq/FAQ164)    LDLDIRECT 68 03/15/2020 1159     Risk Assessment/Calculations:   {Does this  patient have ATRIAL FIBRILLATION?:[phone]}       Physical Exam:    VS:  There were no vitals taken for this visit.    Wt Readings from Last 3 Encounters:  02/25/21 236 lb 12.8 oz (107.4 kg)  04/10/20 241 lb 3.2 oz (109.4 kg)  04/09/20 241 lb (109.3 kg)     GEN: *** Well nourished, well developed in no acute distress HEENT: Normal NECK: No JVD; No carotid bruits CARDIAC: ***RRR, no murmurs, rubs, gallops RESPIRATORY:  Clear to auscultation without rales, wheezing or rhonchi  ABDOMEN: Soft, non-tender, non-distended MUSCULOSKELETAL:  No edema; No deformity. *** pedal pulses, ***bilaterally SKIN: Warm and dry NEUROLOGIC:  Alert and oriented x 3 PSYCHIATRIC:  Normal affect   EKG:  EKG is *** ordered today.  The ekg ordered today demonstrates ***  Diagnoses:    1. Type 2 diabetes mellitus with complication, with long-term current use of insulin ([place])   2. Hyperlipidemia LDL goal <70   3. Coronary artery calcification seen on CT scan   4. Essential hypertension     Assessment and Plan:     ***          {Are you ordering a CV Procedure (e.g. stress test, cath, DCCV, TEE, etc)?   Press F2        :[id number]}    Medication Adjustments/Labs and Tests Ordered: Current medicines are reviewed at length with the patient today.  Concerns regarding medicines are outlined above.  No orders of the defined types were placed in this encounter.  No orders of the defined types were placed in this encounter.   There are no Patient Instructions on file for this visit.   Signed, Emmaline Life, NP  03/06/2021 7:27 AM    [facility]

## 2021-03-06 ENCOUNTER — Ambulatory Visit (HOSPITAL_BASED_OUTPATIENT_CLINIC_OR_DEPARTMENT_OTHER): Payer: BC Managed Care – PPO | Admitting: Nurse Practitioner

## 2021-03-11 DIAGNOSIS — R942 Abnormal results of pulmonary function studies: Secondary | ICD-10-CM | POA: Diagnosis not present

## 2021-03-11 DIAGNOSIS — E782 Mixed hyperlipidemia: Secondary | ICD-10-CM | POA: Diagnosis not present

## 2021-03-11 DIAGNOSIS — Z6834 Body mass index (BMI) 34.0-34.9, adult: Secondary | ICD-10-CM | POA: Diagnosis not present

## 2021-03-16 ENCOUNTER — Other Ambulatory Visit: Payer: Self-pay | Admitting: Family Medicine

## 2021-03-19 DIAGNOSIS — M5032 Other cervical disc degeneration, mid-cervical region, unspecified level: Secondary | ICD-10-CM | POA: Diagnosis not present

## 2021-03-19 DIAGNOSIS — M9903 Segmental and somatic dysfunction of lumbar region: Secondary | ICD-10-CM | POA: Diagnosis not present

## 2021-03-19 DIAGNOSIS — M9901 Segmental and somatic dysfunction of cervical region: Secondary | ICD-10-CM | POA: Diagnosis not present

## 2021-03-19 DIAGNOSIS — M5386 Other specified dorsopathies, lumbar region: Secondary | ICD-10-CM | POA: Diagnosis not present

## 2021-03-24 ENCOUNTER — Observation Stay (HOSPITAL_COMMUNITY)
Admission: EM | Admit: 2021-03-24 | Discharge: 2021-03-25 | Disposition: A | Discharge status: Home / Self Care | Payer: BC Managed Care – PPO | Attending: Family Medicine | Admitting: Family Medicine

## 2021-03-24 ENCOUNTER — Other Ambulatory Visit: Payer: Self-pay

## 2021-03-24 DIAGNOSIS — Z794 Long term (current) use of insulin: Secondary | ICD-10-CM | POA: Diagnosis not present

## 2021-03-24 DIAGNOSIS — E1165 Type 2 diabetes mellitus with hyperglycemia: Secondary | ICD-10-CM | POA: Diagnosis not present

## 2021-03-24 DIAGNOSIS — E860 Dehydration: Secondary | ICD-10-CM | POA: Diagnosis not present

## 2021-03-24 DIAGNOSIS — I10 Essential (primary) hypertension: Secondary | ICD-10-CM | POA: Diagnosis not present

## 2021-03-24 DIAGNOSIS — E782 Mixed hyperlipidemia: Secondary | ICD-10-CM | POA: Diagnosis not present

## 2021-03-24 DIAGNOSIS — Z20822 Contact with and (suspected) exposure to covid-19: Secondary | ICD-10-CM | POA: Diagnosis not present

## 2021-03-24 DIAGNOSIS — Z7901 Long term (current) use of anticoagulants: Secondary | ICD-10-CM | POA: Diagnosis not present

## 2021-03-24 DIAGNOSIS — R112 Nausea with vomiting, unspecified: Secondary | ICD-10-CM | POA: Diagnosis present

## 2021-03-24 DIAGNOSIS — Z7982 Long term (current) use of aspirin: Secondary | ICD-10-CM | POA: Diagnosis not present

## 2021-03-24 DIAGNOSIS — E785 Hyperlipidemia, unspecified: Secondary | ICD-10-CM | POA: Diagnosis not present

## 2021-03-24 DIAGNOSIS — R079 Chest pain, unspecified: Secondary | ICD-10-CM | POA: Diagnosis not present

## 2021-03-24 DIAGNOSIS — R0789 Other chest pain: Secondary | ICD-10-CM | POA: Diagnosis not present

## 2021-03-24 DIAGNOSIS — R000 Tachycardia, unspecified: Secondary | ICD-10-CM | POA: Diagnosis present

## 2021-03-24 DIAGNOSIS — Z79899 Other long term (current) drug therapy: Secondary | ICD-10-CM | POA: Diagnosis not present

## 2021-03-24 DIAGNOSIS — R739 Hyperglycemia, unspecified: Secondary | ICD-10-CM | POA: Diagnosis not present

## 2021-03-24 DIAGNOSIS — K219 Gastro-esophageal reflux disease without esophagitis: Secondary | ICD-10-CM | POA: Diagnosis present

## 2021-03-24 DIAGNOSIS — R1084 Generalized abdominal pain: Secondary | ICD-10-CM | POA: Diagnosis not present

## 2021-03-24 LAB — GLUCOSE, CAPILLARY: Glucose-Capillary: 265 mg/dL — ABNORMAL HIGH (ref 70–99)

## 2021-03-24 LAB — I-STAT VENOUS BLOOD GAS, ED
Acid-base deficit: 5 mmol/L — ABNORMAL HIGH (ref 0.0–2.0)
Bicarbonate: 17.9 mmol/L — ABNORMAL LOW (ref 20.0–28.0)
Calcium, Ion: 1.08 mmol/L — ABNORMAL LOW (ref 1.15–1.40)
HCT: 42 % (ref 39.0–52.0)
Hemoglobin: 14.3 g/dL (ref 13.0–17.0)
O2 Saturation: 95 %
Potassium: 4.2 mmol/L (ref 3.5–5.1)
Sodium: 138 mmol/L (ref 135–145)
TCO2: 19 mmol/L — ABNORMAL LOW (ref 22–32)
pCO2, Ven: 27.3 mmHg — ABNORMAL LOW (ref 44–60)
pH, Ven: 7.424 (ref 7.25–7.43)
pO2, Ven: 73 mmHg — ABNORMAL HIGH (ref 32–45)

## 2021-03-24 LAB — COMPREHENSIVE METABOLIC PANEL
ALT: 19 U/L (ref 0–44)
AST: 19 U/L (ref 15–41)
Albumin: 3.4 g/dL — ABNORMAL LOW (ref 3.5–5.0)
Alkaline Phosphatase: 79 U/L (ref 38–126)
Anion gap: 13 (ref 5–15)
BUN: 18 mg/dL (ref 6–20)
CO2: 16 mmol/L — ABNORMAL LOW (ref 22–32)
Calcium: 8.1 mg/dL — ABNORMAL LOW (ref 8.9–10.3)
Chloride: 108 mmol/L (ref 98–111)
Creatinine, Ser: 0.9 mg/dL (ref 0.61–1.24)
GFR, Estimated: >60 mL/min (ref >60)
Glucose, Bld: 308 mg/dL — ABNORMAL HIGH (ref 70–99)
Potassium: 4.1 mmol/L (ref 3.5–5.1)
Sodium: 137 mmol/L (ref 135–145)
Total Bilirubin: 0.7 mg/dL (ref 0.3–1.2)
Total Protein: 6.5 g/dL (ref 6.5–8.1)

## 2021-03-24 LAB — RESP PANEL BY RT-PCR (FLU A&B, COVID) ARPGX2
Influenza A by PCR: NEGATIVE
Influenza B by PCR: NEGATIVE
SARS Coronavirus 2 by RT PCR: NEGATIVE

## 2021-03-24 LAB — CBC WITH DIFFERENTIAL/PLATELET
Abs Immature Granulocytes: 0.04 10*3/uL (ref 0.00–0.07)
Basophils Absolute: 0 10*3/uL (ref 0.0–0.1)
Basophils Relative: 0 %
Eosinophils Absolute: 0.1 10*3/uL (ref 0.0–0.5)
Eosinophils Relative: 1 %
HCT: 41.4 % (ref 39.0–52.0)
Hemoglobin: 13.4 g/dL (ref 13.0–17.0)
Immature Granulocytes: 0 %
Lymphocytes Relative: 3 %
Lymphs Abs: 0.3 10*3/uL — ABNORMAL LOW (ref 0.7–4.0)
MCH: 25 pg — ABNORMAL LOW (ref 26.0–34.0)
MCHC: 32.4 g/dL (ref 30.0–36.0)
MCV: 77.1 fL — ABNORMAL LOW (ref 80.0–100.0)
Monocytes Absolute: 0.2 10*3/uL (ref 0.1–1.0)
Monocytes Relative: 3 %
Neutro Abs: 8.4 10*3/uL — ABNORMAL HIGH (ref 1.7–7.7)
Neutrophils Relative %: 93 %
Platelets: 368 10*3/uL (ref 150–400)
RBC: 5.37 MIL/uL (ref 4.22–5.81)
RDW: 14.6 % (ref 11.5–15.5)
WBC: 9.1 10*3/uL (ref 4.0–10.5)
nRBC: 0 % (ref 0.0–0.2)

## 2021-03-24 LAB — BASIC METABOLIC PANEL
Anion gap: 13 (ref 5–15)
BUN: 18 mg/dL (ref 6–20)
CO2: 18 mmol/L — ABNORMAL LOW (ref 22–32)
Calcium: 8.2 mg/dL — ABNORMAL LOW (ref 8.9–10.3)
Chloride: 104 mmol/L (ref 98–111)
Creatinine, Ser: 0.9 mg/dL (ref 0.61–1.24)
GFR, Estimated: >60 mL/min (ref >60)
Glucose, Bld: 295 mg/dL — ABNORMAL HIGH (ref 70–99)
Potassium: 4.2 mmol/L (ref 3.5–5.1)
Sodium: 135 mmol/L (ref 135–145)

## 2021-03-24 LAB — BETA-HYDROXYBUTYRIC ACID: Beta-Hydroxybutyric Acid: 1.17 mmol/L — ABNORMAL HIGH (ref 0.05–0.27)

## 2021-03-24 LAB — LACTIC ACID, PLASMA
Lactic Acid, Venous: 2.2 mmol/L (ref 0.5–1.9)
Lactic Acid, Venous: 1.5 mmol/L (ref 0.5–1.9)

## 2021-03-24 LAB — MAGNESIUM: Magnesium: 1.4 mg/dL — ABNORMAL LOW (ref 1.7–2.4)

## 2021-03-24 LAB — TROPONIN I (HIGH SENSITIVITY)
Troponin I (High Sensitivity): 5 ng/L (ref <18)
Troponin I (High Sensitivity): 5 ng/L (ref <18)

## 2021-03-24 LAB — LIPASE, BLOOD: Lipase: 28 U/L (ref 11–51)

## 2021-03-24 LAB — CBG MONITORING, ED
Glucose-Capillary: 301 mg/dL — ABNORMAL HIGH (ref 70–99)
Glucose-Capillary: 261 mg/dL — ABNORMAL HIGH (ref 70–99)

## 2021-03-24 MED ORDER — INSULIN DETEMIR 100 UNIT/ML ~~LOC~~ SOLN
20.0000 [IU] | Freq: Two times a day (BID) | SUBCUTANEOUS | Status: DC
  Administered 2021-03-24 – 2021-03-25 (×2): 20 [IU] via SUBCUTANEOUS
  Filled 2021-03-24 (×5): qty 0.2

## 2021-03-24 MED ORDER — PANTOPRAZOLE SODIUM 40 MG PO TBEC
40.0000 mg | DELAYED_RELEASE_TABLET | Freq: Every day | ORAL | Status: DC
  Administered 2021-03-25: 40 mg via ORAL
  Filled 2021-03-24: qty 1

## 2021-03-24 MED ORDER — ACETAMINOPHEN 500 MG PO TABS
500.0000 mg | ORAL_TABLET | Freq: Four times a day (QID) | ORAL | Status: DC | PRN
  Administered 2021-03-24 – 2021-03-25 (×2): 500 mg via ORAL
  Filled 2021-03-24 (×2): qty 1

## 2021-03-24 MED ORDER — PAROXETINE HCL 20 MG PO TABS
20.0000 mg | ORAL_TABLET | Freq: Every day | ORAL | Status: DC
  Administered 2021-03-25: 20 mg via ORAL
  Filled 2021-03-24: qty 1

## 2021-03-24 MED ORDER — INSULIN ASPART 100 UNIT/ML IJ SOLN
10.0000 [IU] | Freq: Once | INTRAMUSCULAR | Status: AC
Start: 2021-03-24 — End: 2021-03-24
  Administered 2021-03-24: 10 [IU] via INTRAVENOUS

## 2021-03-24 MED ORDER — METOPROLOL TARTRATE 25 MG PO TABS
25.0000 mg | ORAL_TABLET | Freq: Two times a day (BID) | ORAL | Status: DC
  Administered 2021-03-24 – 2021-03-25 (×2): 25 mg via ORAL
  Filled 2021-03-24 (×2): qty 1

## 2021-03-24 MED ORDER — LISINOPRIL 10 MG PO TABS
10.0000 mg | ORAL_TABLET | Freq: Every day | ORAL | Status: DC
  Administered 2021-03-25: 10 mg via ORAL
  Filled 2021-03-24: qty 1

## 2021-03-24 MED ORDER — LACTATED RINGERS IV BOLUS
1000.0000 mL | Freq: Once | INTRAVENOUS | Status: AC
  Administered 2021-03-24: 1000 mL via INTRAVENOUS

## 2021-03-24 MED ORDER — INSULIN ASPART 100 UNIT/ML IJ SOLN
0.0000 [IU] | Freq: Three times a day (TID) | INTRAMUSCULAR | Status: DC
  Administered 2021-03-25: 11 [IU] via SUBCUTANEOUS

## 2021-03-24 MED ORDER — ROSUVASTATIN CALCIUM 20 MG PO TABS
40.0000 mg | ORAL_TABLET | Freq: Every day | ORAL | Status: DC
  Administered 2021-03-25: 40 mg via ORAL
  Filled 2021-03-24: qty 2

## 2021-03-24 MED ORDER — MAGNESIUM SULFATE 4 GM/100ML IV SOLN
4.0000 g | Freq: Once | INTRAVENOUS | Status: AC
  Administered 2021-03-24: 4 g via INTRAVENOUS
  Filled 2021-03-24: qty 100

## 2021-03-24 MED ORDER — ENOXAPARIN SODIUM 40 MG/0.4ML IJ SOSY: 40.0000 mg | PREFILLED_SYRINGE | INTRAMUSCULAR | Status: DC

## 2021-03-24 NOTE — H&P (Cosign Needed Addendum)
[facility] Admission History and Physical Service Pager: [phone]  Patient name: Chad Haynes Medical record number: [id number] Date of birth: 1965-12-03 Age: 56 y.o. Gender: male  Primary Care Provider: [facility] Consultants: None Code Status: FULL Preferred Emergency Contact: Verda Cumins, wife, [phone]   Chief Complaint: abdominal pain  Assessment and Plan: Chad Haynes is a 56 y.o. male presenting with abdominal pain and found to be hyperglycemia. PMH is significant for T2DM, HTN, HLD, GERD.  Poorly controlled T2DM   Hyperglycemia  Abdominal pain/vomiting: resolved Suspect abdominal pain and vomiting related to uncontrolled T2DM.  Resolved at this time with 2x 1 L LR bolus.  Did not present with DKA given normal anion gap and pH.  CO2 decreased at 16 and glucose in the 300s.  BHB elevated at1.17, LA 2.2.  CBC only remarkable for ANC 8.4, however patient does not appear infectious and is afebrile at this time with unremarkable physical exam.  Troponin and lipase unremarkable, unlikely to be pancreatitis or cardiac in nature. Patient has history of poorly controlled T2DM and history suspicious given he was unable to state sugar despite having continuous glucose monitor.  This likely represents hyperglycemia potentially progressing to DKA. Home medications include Levemir 50u nightly, Novolin 5-11u TID, metformin 1047m BID, glipizide 547mBID. Home blood sugars run about 200-240. Last a1c 12.8 on 02/03/21. Follows endocrinologist in [place] May also consider poorly controlled GERD.  Patient will require admission for observation and treatment of hyperglycemia. -[facility], med telemetry -Continuous cardiac monitoring x24 hours, vital signs per unit -Consult Diabetes Coordinator, appreciate assistance -CBG monitoring every 2 hours -Levemir 20 units twice daily -Moderate SSI -Carb modified  diet -Encourage importance of hydration -BMP at 2000 -A1c, BHB, BMP in the a.m.  Tachycardia Patient endorses is history of baseline tachycardia around the 120s.  Presented in the 150s but somewhat improved after fluid boluses.  Echo on 05/14/2020 showed LVEF 55 to 60% without regional wall motion abnormalities, aortic dilatation that is mild at the root. Will need to explore cardiology notes further to ensure this has been fully worked up and cardiology has endorsed it is appropriate to continue without treatment. Will treat at this time.  He was given Bystolic 2.5 mg by his PCP. [facility]  -Continuous cardiac monitoring -Metoprolol 2573mID  HTN Home medications include lisinopril 59m68mily for renal protection. BP 120-130s/80s here.  -Continue lisinopril 10 mg daily  HLD Chronic. Home medications include rosuvastatin 40mg74mly. -Continue home medications  GERD Chronic and stable. Home medications include Omeprazole 20 mg daily. -Continue home medications  Anxiety and Depression Chronic and stable. Home medications include Paxil 20 mg daily. -Continue home medications  Aspirin use No indication for this for primary or secondary prevention.  Will attempt to discuss with patient tomorrow. -Hold home aspirin  FEN/GI: Carb modified diet Prophylaxis: Lovenox  Disposition: Med-tele  History of Present Illness:  Chad Haynes 55 y.74 male presenting with abdominal and chest pain.  Had some lower abdominal pain when he got up around 7:30 AM.  He has been having nausea and vomiting since his arrival. Has had 5 episodes of vomiting in the ED.  Non bloody emesis, it was a gray liquid. Abdominal pain not described as sharp or dull. He has some "tightness" and "like heart burn" chest pain. Denies difficulty breathing. Blood sugars normally run around 200. Unsure what  his blood sugar was yesterday. Has been urinating a little more than normal. He  did not take his medications this morning.   Quit smoking about 30 years ago. Uses alcohol occasionally. No illicit drug use.    Patient endorses that he takes aspirin 81 mg because his wife wants him to live longer.  This was not advised by a health professional.  In the ED, patient receive 2L LR and 10u Novolog. Improved symptoms after 1L LR.   Review Of Systems: Per HPI with the following additions:   Review of Systems  Constitutional:  Negative for fever.  HENT:  Negative for rhinorrhea.   Eyes:        Blurry vision   Respiratory:  Negative for cough and shortness of breath.   Cardiovascular:  Positive for chest pain.  Gastrointestinal:  Positive for abdominal pain, nausea and vomiting. Negative for blood in stool and diarrhea.  Genitourinary:  Negative for decreased urine volume, difficulty urinating and dysuria.  Musculoskeletal:  Back pain: chronic.  Skin:  Negative for rash.  Neurological:  Negative for headaches.  All other systems reviewed and are negative.   Patient Active Problem List   Diagnosis Date Noted   Hypertension 03/15/2020   Uncontrolled type 2 diabetes mellitus with hyperglycemia ([place]) 04/27/2018   Rotator cuff tendinitis, left 01/18/2017   Erectile dysfunction 05/07/2016   Non compliance with medical treatment 04/04/2015   Obesity 04/30/2014   Vitamin B12 deficiency 12/04/2012   Vitamin D deficiency 09/28/2012   Depression    GERD (gastroesophageal reflux disease)    Hyperlipidemia     Past Medical History: Past Medical History:  Diagnosis Date   Depression    Diabetes mellitus without complication ([facility])    Diabetes mellitus, type II ([place])    GERD (gastroesophageal reflux disease)    H. pylori infection    HTN (hypertension)    Hyperlipidemia    Hypogonadism male    Leg pain    Low magnesium levels    Neck pain    Rhinitis, allergic    Vitamin B12 deficiency    Vitamin D deficiency    White coat hypertension     Past Surgical  History: Past Surgical History:  Procedure Laterality Date   KNEE ARTHROSCOPY Right     Social History: Social History   Tobacco Use   Smoking status: Former    Types: Cigarettes    Quit date: 06/08/1991    Years since quitting: 29.8   Smokeless tobacco: Never  Vaping Use   Vaping Use: Never used  Substance Use Topics   Alcohol use: Yes    Alcohol/week: 0.0 standard drinks    Comment: occasionally   Drug use: No    Family History: Family History  Problem Relation Age of Onset   COPD Mother        smoker   Hypertension Father     Allergies and Medications: No Known Allergies No current facility-administered medications on file prior to encounter.   Current Outpatient Medications on File Prior to Encounter  Medication Sig Dispense Refill   aspirin 81 MG tablet Take 1 tablet (81 mg total) by mouth daily. 30 tablet 11   glipiZIDE (GLUCOTROL) 5 MG tablet TAKE 1 TABLET BY MOUTH TWICE A DAY BEFORE A MEAL/PT NEEDS OV FOR FURTHER REFILLS 60 tablet 0   LEVEMIR FLEXTOUCH 100 UNIT/ML FlexTouch Pen INJECT 20 UNITS IN THE MORNING AND 20 UNITS IN THE EVENING, SUBCUTANEOUSLY (Patient taking differently: Inject 50 Units into the  skin at bedtime.) 15 mL 4   lisinopril (ZESTRIL) 10 MG tablet Take 1 tablet (10 mg total) by mouth daily. 30 tablet 3   metFORMIN (GLUCOPHAGE) 1000 MG tablet TAKE 1 TABLET (1,000 MG TOTAL) BY MOUTH 2 (TWO) TIMES DAILY WITH A MEAL 60 tablet 5   NOVOLIN R 100 UNIT/ML injection SLIDING SCALE 150-200=2UNITS,201-250=4 U,251-300=6U,301-350=8U,351-400=10 U,401-450=12U & MAKE APPT 10 mL 11   omeprazole (PRILOSEC OTC) 20 MG tablet Take 20 mg by mouth daily.     PARoxetine (PAXIL) 20 MG tablet Take 1 tablet (20 mg total) by mouth daily. 90 tablet 3   rosuvastatin (CRESTOR) 40 MG tablet Take 1 tablet (40 mg total) by mouth daily. 90 tablet 2   BD PEN NEEDLE NANO 2ND GEN 32G X 4 MM MISC USE AS DIRECTED WITH INSULIN 100 each 1   Continuous Blood Gluc Sensor (FREESTYLE LIBRE  14 DAY SENSOR) MISC Use as directed to monitor glucose continuously. Change sensor Q 14 days. Dx: E11.65. 18 each 3   Insulin Syringe-Needle U-100 (BD INSULIN SYRINGE U/F) 31G X 5/16" 0.3 ML MISC USE WITH SLIDING SCALE INSULIN 100 each 1   omeprazole (PRILOSEC) 40 MG capsule Take 1 capsule (40 mg total) by mouth daily. (Patient not taking: Reported on 03/24/2021) 90 capsule 2   QUICKVUE AT-HOME COVID-19 TEST KIT See admin instructions.      Objective: BP 139/89    Pulse (!) 134    Temp 99.3 F (37.4 C)    Resp 19    Ht _0  (1.778 m)    Wt 108.9 kg    SpO2 96%    BMI 34.44 kg/m  Exam: General: Awake, alert and appropriately responsive in NAD HEENT: EOMI, tachy mucus membranes, no nasal discharge Neck: normal ROM Chest: CTAB, normal WOB. Good air movement bilaterally.   Heart: RRR, no murmur appreciated Abdomen: Soft, non-tender, non-distended. Normoactive bowel sounds Extremities: Moves all extremities equally.no appreciable LE edema, no calf tenderness MSK: Normal bulk and tone Neuro: Appropriately responsive to stimuli. No gross deficits appreciated.  Skin: No rashes or lesions appreciated.    Labs and Imaging: CBC BMET  Recent Labs  Lab 03/24/21 1532 03/24/21 1600  WBC 9.1  --   HGB 13.4 14.3  HCT 41.4 42.0  PLT 368  --    Recent Labs  Lab 03/24/21 1532 03/24/21 1600  NA 137 138  K 4.1 4.2  CL 108  --   CO2 16*  --   BUN 18  --   CREATININE 0.90  --   GLUCOSE 308*  --   CALCIUM 8.1*  --      EKG: Sinus tachycardia, HR146, no ST elevation  No results found.   Wells Guiles, DO 03/24/2021, 5:14 PM PGY-1, [place] Intern pager: [phone], text pages welcome  FPTS Upper-Level Resident Addendum   I have independently interviewed and examined the patient. I have discussed the above with the original author and agree with their documentation. My edits for correction/addition/clarification are in within the document. Please see also any  attending notes.   Lyndee Hensen, DO PGY-3, [facility] 03/24/2021 7:20 PM  FPTS Service pager: [phone] (text pages welcome through [facility])

## 2021-03-24 NOTE — Hospital Course (Signed)
Chad Haynes is a 56 y.o.male with a history of *** who was admitted to the *** Teaching Service at [facility] for ***. {Blank single:19197::"His","Her"} hospital course is detailed below:   Other chronic conditions were medically managed with home medications and formulary alternatives as necessary (***)  PCP Follow-up Recommendations:

## 2021-03-24 NOTE — ED Triage Notes (Signed)
Pt arrived from home from EMS complaining of abdominal pain that started this morning and has now moved into his chest. Pt is actively vomiting on arrival, nausea started with chest pain ?Hx type 2 DM, has not taken any medications this morning  ? ?Ems gave '4mg'$  zofran PIV  ? ?

## 2021-03-24 NOTE — ED Notes (Signed)
ED TO INPATIENT HANDOFF REPORT  ED Nurse Name and Phone #: Ellagrace Yoshida RN [phone]  S Name/Age/Gender Chad Haynes 56 y.o. male Room/Bed: 045C/045C  Code Status   Code Status: Full Code  Home/SNF/Other Home Patient oriented to: self, place, time, and situation Is this baseline? Yes   Triage Complete: Triage complete  Chief Complaint Hyperglycemia due to type 2 diabetes mellitus ([place]) [E11.65]  Triage Note Pt arrived from home from EMS complaining of abdominal pain that started this morning and has now moved into his chest. Pt is actively vomiting on arrival, nausea started with chest pain Hx type 2 DM, has not taken any medications this morning   Ems gave '4mg'$  zofran PIV     Allergies No Known Allergies  Level of Care/Admitting Diagnosis ED Disposition     ED Disposition  Admit   Condition  --   [place]: [place] [[id number]]  Level of Care: Telemetry Medical [[id number]]  May place patient in observation at [facility] or [facility] if equivalent level of care is available:: No  Covid Evaluation: Asymptomatic Screening Protocol (No Symptoms)  Diagnosis: Hyperglycemia due to type 2 diabetes mellitus [facility]) [[id number]]  Admitting Physician: Wells Guiles [[id number]]  Attending Physician: MCDIARMID, TODD D [[id number]]          B Medical/Surgery History Past Medical History:  Diagnosis Date   Depression    Diabetes mellitus without complication ([place])    Diabetes mellitus, type II ([place])    GERD (gastroesophageal reflux disease)    H. pylori infection    HTN (hypertension)    Hyperlipidemia    Hypogonadism male    Leg pain    Low magnesium levels    Neck pain    Rhinitis, allergic    Vitamin B12 deficiency    Vitamin D deficiency    White coat hypertension    Past Surgical History:  Procedure Laterality Date   KNEE ARTHROSCOPY Right      A IV Location/Drains/Wounds Patient Lines/Drains/Airways Status     Active  Line/Drains/Airways     Name Placement date Placement time Site Days   Peripheral IV 03/24/21 20 G Posterior;Right Hand 03/24/21  1519  Hand  less than 1            Intake/Output Last 24 hours  Intake/Output Summary (Last 24 hours) at 03/24/2021 2125 Last data filed at 03/24/2021 2054 Gross per 24 hour  Intake 2000 ml  Output --  Net 2000 ml    Labs/Imaging Results for orders placed or performed during the hospital encounter of 03/24/21 (from the past 48 hour(s))  CBG monitoring, ED     Status: Abnormal   Collection Time: 03/24/21  3:24 PM  Result Value Ref Range   Glucose-Capillary 301 (H) 70 - 99 mg/dL    Comment: Glucose reference range applies only to samples taken after fasting for at least 8 hours.  CBC with Differential     Status: Abnormal   Collection Time: 03/24/21  3:32 PM  Result Value Ref Range   WBC 9.1 4.0 - 10.5 K/uL   RBC 5.37 4.22 - 5.81 MIL/uL   Hemoglobin 13.4 13.0 - 17.0 g/dL   HCT 41.4 39.0 - 52.0 %   MCV 77.1 (L) 80.0 - 100.0 fL   MCH 25.0 (L) 26.0 - 34.0 pg   MCHC 32.4 30.0 - 36.0 g/dL   RDW 14.6 11.5 - 15.5 %   Platelets 368 150 - 400 K/uL  nRBC 0.0 0.0 - 0.2 %   Neutrophils Relative % 93 %   Neutro Abs 8.4 (H) 1.7 - 7.7 K/uL   Lymphocytes Relative 3 %   Lymphs Abs 0.3 (L) 0.7 - 4.0 K/uL   Monocytes Relative 3 %   Monocytes Absolute 0.2 0.1 - 1.0 K/uL   Eosinophils Relative 1 %   Eosinophils Absolute 0.1 0.0 - 0.5 K/uL   Basophils Relative 0 %   Basophils Absolute 0.0 0.0 - 0.1 K/uL   Immature Granulocytes 0 %   Abs Immature Granulocytes 0.04 0.00 - 0.07 K/uL    Comment: Performed at [facility] [address] [place], [place] [address]  Comprehensive metabolic panel     Status: Abnormal   Collection Time: 03/24/21  3:32 PM  Result Value Ref Range   Sodium 137 135 - 145 mmol/L   Potassium 4.1 3.5 - 5.1 mmol/L   Chloride 108 98 - 111 mmol/L   CO2 16 (L) 22 - 32 mmol/L   Glucose, Bld 308 (H) 70 - 99 mg/dL    Comment: Glucose  reference range applies only to samples taken after fasting for at least 8 hours.   BUN 18 6 - 20 mg/dL   Creatinine, Ser 0.90 0.61 - 1.24 mg/dL   Calcium 8.1 (L) 8.9 - 10.3 mg/dL   Total Protein 6.5 6.5 - 8.1 g/dL   Albumin 3.4 (L) 3.5 - 5.0 g/dL   AST 19 15 - 41 U/L   ALT 19 0 - 44 U/L   Alkaline Phosphatase 79 38 - 126 U/L   Total Bilirubin 0.7 0.3 - 1.2 mg/dL   GFR, Estimated >60 >60 mL/min    Comment: (NOTE) Calculated using the CKD-EPI Creatinine Equation (2021)    Anion gap 13 5 - 15    Comment: Performed at [facility], [place] [address], [place], [place] [address]  Lipase, blood     Status: None   Collection Time: 03/24/21  3:32 PM  Result Value Ref Range   Lipase 28 11 - 51 U/L    Comment: Performed at [facility], [place] [address], [place], [place] [address]  Beta-hydroxybutyric acid     Status: Abnormal   Collection Time: 03/24/21  3:32 PM  Result Value Ref Range   Beta-Hydroxybutyric Acid 1.17 (H) 0.05 - 0.27 mmol/L    Comment: Performed at [facility] [address] [place], [place] [address]  Troponin I (High Sensitivity)     Status: None   Collection Time: 03/24/21  3:32 PM  Result Value Ref Range   Troponin I (High Sensitivity) 5 <18 ng/L    Comment: (NOTE) Elevated high sensitivity troponin I (hsTnI) values and significant  changes across serial measurements may suggest ACS but many other  chronic and acute conditions are known to elevate hsTnI results.  Refer to the "Links" section for chest pain algorithms and additional  guidance. Performed at [facility], [place] [address], [place], [place] [address]   I-Stat venous blood gas, [facility] ED)     Status: Abnormal   Collection Time: 03/24/21  4:00 PM  Result Value Ref Range   pH, Ven 7.424 7.25 - 7.43   pCO2, Ven 27.3 (L) 44 - 60 mmHg   pO2, Ven 73 (H) 32 - 45 mmHg   Bicarbonate 17.9 (L) 20.0 - 28.0 mmol/L   TCO2 19 (L) 22 - 32 mmol/L   O2 Saturation 95 %   Acid-base  deficit 5.0 (H)  0.0 - 2.0 mmol/L   Sodium 138 135 - 145 mmol/L   Potassium 4.2 3.5 - 5.1 mmol/L   Calcium, Ion 1.08 (L) 1.15 - 1.40 mmol/L   HCT 42.0 39.0 - 52.0 %   Hemoglobin 14.3 13.0 - 17.0 g/dL   Sample type VENOUS   Lactic acid, plasma     Status: Abnormal   Collection Time: 03/24/21  5:09 PM  Result Value Ref Range   Lactic Acid, Venous 2.2 (HH) 0.5 - 1.9 mmol/L    Comment: CRITICAL RESULT CALLED TO, READ BACK BY AND VERIFIED WITH: J.DODD,RN 03/24/2021 AT 1812 A.HUGHES Performed at [facility], [place] [address], [place], [place] [address]   Troponin I (High Sensitivity)     Status: None   Collection Time: 03/24/21  6:11 PM  Result Value Ref Range   Troponin I (High Sensitivity) 5 <18 ng/L    Comment: (NOTE) Elevated high sensitivity troponin I (hsTnI) values and significant  changes across serial measurements may suggest ACS but many other  chronic and acute conditions are known to elevate hsTnI results.  Refer to the "Links" section for chest pain algorithms and additional  guidance. Performed at [facility], [place] [address], [place], [place] [address]    No results found.  Pending Labs Unresulted Labs (From admission, onward)     Start     Ordered   03/25/21 1696  Basic metabolic panel  Tomorrow morning,   R        03/24/21 1833   03/25/21 0500  Beta-hydroxybutyric acid  Tomorrow morning,   R        03/24/21 1833   03/24/21 7893  Basic metabolic panel  Once,   R        03/24/21 1833   03/24/21 1942  Hemoglobin A1c  Tomorrow morning,   R       Comments: To assess prior glycemic control    03/24/21 1943   03/24/21 1811  HIV Antibody (routine testing w rflx)  (HIV Antibody (Routine testing w reflex) panel)  Once,   R        03/24/21 1833   03/24/21 1700  Lactic acid, plasma  Now then every 2 hours,   STAT      03/24/21 1659   03/24/21 1527  Urinalysis, Routine w reflex microscopic  (ED Abdominal Pain)  Once,   STAT        03/24/21 1526             Vitals/Pain Today's Vitals   03/24/21 1519 03/24/21 1524 03/24/21 1525 03/24/21 1630  BP:  123/88  139/89  Pulse:  (!) 150  (!) 134  Resp:  20  19  Temp:  99.3 F (37.4 C)    SpO2: 94% 96%  96%  Weight:   108.9 kg   Height:   '5\' 10"'$  (1.778 m)   PainSc:  2       Isolation Precautions No active isolations  Medications Medications  lisinopril (ZESTRIL) tablet 10 mg (has no administration in time range)  rosuvastatin (CRESTOR) tablet 40 mg (has no administration in time range)  PARoxetine (PAXIL) tablet 20 mg (has no administration in time range)  omeprazole (PRILOSEC OTC) EC tablet 20 mg (has no administration in time range)  insulin aspart (novoLOG) injection 0-15 Units (has no administration in time range)  enoxaparin (LOVENOX) injection 40 mg (has no administration in time range)  insulin detemir (LEVEMIR) FlexPen 20  Units (has no administration in time range)  metoprolol tartrate (LOPRESSOR) tablet 25 mg (has no administration in time range)  acetaminophen (TYLENOL) tablet 500 mg (500 mg Oral Given 03/24/21 2058)  lactated ringers bolus 1,000 mL (0 mLs Intravenous Stopped 03/24/21 2054)  lactated ringers bolus 1,000 mL (0 mLs Intravenous Stopped 03/24/21 2054)  insulin aspart (novoLOG) injection 10 Units (10 Units Intravenous Given 03/24/21 1712)    Mobility walks Low fall risk    R Recommendations: See Admitting Provider Note  Report given to: Tad Moore RN  Additional Notes:

## 2021-03-24 NOTE — ED Provider Notes (Signed)
[facility] EMERGENCY DEPARTMENT Provider Note  CSN: [id number] Arrival date & time: 03/24/21 1517  Chief Complaint(s) Abdominal Pain  HPI Chad Haynes is a 56 y.o. male with PMH uncontrolled T2DM, H. pylori, HTN, HLD who presents emergency department for evaluation of abdominal pain, nausea, vomiting, chest pain and tachycardia.  Patient states that his symptoms began abruptly this morning with associated nausea and abdominal pain that then progressed to vomiting followed by chest pain.  Patient states that he has issues with chronic tachycardia and his normal heart rate is around 120.  Patient arrives with heart rates in the 150s with dry tacky mucous membranes but states that his chest pain and his abdominal pain have resolved.  Patient Dors is mild nausea but denies headache, fever, diarrhea or other systemic symptoms.   Abdominal Pain Associated symptoms: chest pain, nausea and vomiting    Past Medical History Past Medical History:  Diagnosis Date   Depression    Diabetes mellitus without complication ([place])    Diabetes mellitus, type II ([place])    GERD (gastroesophageal reflux disease)    H. pylori infection    HTN (hypertension)    Hyperlipidemia    Hypogonadism male    Leg pain    Low magnesium levels    Neck pain    Rhinitis, allergic    Vitamin B12 deficiency    Vitamin D deficiency    White coat hypertension    Patient Active Problem List   Diagnosis Date Noted   Hypertension 03/15/2020   Uncontrolled type 2 diabetes mellitus with hyperglycemia ([place]) 04/27/2018   Rotator cuff tendinitis, left 01/18/2017   Erectile dysfunction 05/07/2016   Non compliance with medical treatment 04/04/2015   Obesity 04/30/2014   Vitamin B12 deficiency 12/04/2012   Vitamin D deficiency 09/28/2012   Depression    GERD (gastroesophageal reflux disease)    Hyperlipidemia    Home Medication(s) Prior to Admission medications   Medication Sig Start Date End Date  Taking? Authorizing Provider  aspirin 81 MG tablet Take 1 tablet (81 mg total) by mouth daily. 05/07/16  Yes Dixon, Mary B, PA-C  glipiZIDE (GLUCOTROL) 5 MG tablet TAKE 1 TABLET BY MOUTH TWICE A DAY BEFORE A MEAL/PT NEEDS OV FOR FURTHER REFILLS 02/21/21  Yes Reardon, Whitney J, NP  LEVEMIR FLEXTOUCH 100 UNIT/ML FlexTouch Pen INJECT 20 UNITS IN THE MORNING AND 20 UNITS IN THE EVENING, SUBCUTANEOUSLY Patient taking differently: Inject 50 Units into the skin at bedtime. 10/01/20  Yes Susy Frizzle, MD  lisinopril (ZESTRIL) 10 MG tablet Take 1 tablet (10 mg total) by mouth daily. 10/03/20  Yes Susy Frizzle, MD  metFORMIN (GLUCOPHAGE) 1000 MG tablet TAKE 1 TABLET (1,000 MG TOTAL) BY MOUTH 2 (TWO) TIMES DAILY WITH A MEAL 10/01/20  Yes Susy Frizzle, MD  NOVOLIN R 100 UNIT/ML injection SLIDING SCALE 150-200=2UNITS,201-250=4 U,251-300=6U,301-350=8U,351-400=10 U,401-450=12U & MAKE APPT 04/01/20  Yes Susy Frizzle, MD  omeprazole (PRILOSEC OTC) 20 MG tablet Take 20 mg by mouth daily.   Yes [provider]  PARoxetine (PAXIL) 20 MG tablet Take 1 tablet (20 mg total) by mouth daily. 05/14/20  Yes Susy Frizzle, MD  rosuvastatin (CRESTOR) 40 MG tablet Take 1 tablet (40 mg total) by mouth daily. 11/15/20  Yes Freada Bergeron, MD  BD PEN NEEDLE NANO 2ND GEN 32G X 4 MM MISC USE AS DIRECTED WITH INSULIN 11/25/20   Susy Frizzle, MD  Continuous Blood Gluc Sensor (FREESTYLE LIBRE 14  DAY SENSOR) MISC Use as directed to monitor glucose continuously. Change sensor Q 14 days. Dx: E11.65. 03/11/20   Alycia Rossetti, MD  Insulin Syringe-Needle U-100 (BD INSULIN SYRINGE U/F) 31G X 5/16" 0.3 ML MISC USE WITH SLIDING SCALE INSULIN 07/16/20   Susy Frizzle, MD  omeprazole (PRILOSEC) 40 MG capsule Take 1 capsule (40 mg total) by mouth daily. Patient not taking: Reported on 03/24/2021 12/18/16   Orlena Sheldon, PA-C  QUICKVUE AT-HOME COVID-19 TEST KIT See admin instructions. 01/20/21   [provider]                                                                                                                                    Past Surgical History Past Surgical History:  Procedure Laterality Date   KNEE ARTHROSCOPY Right    Family History Family History  Problem Relation Age of Onset   COPD Mother        smoker   Hypertension Father     Social History Social History   Tobacco Use   Smoking status: Former    Types: Cigarettes    Quit date: 06/08/1991    Years since quitting: 29.8   Smokeless tobacco: Never  Vaping Use   Vaping Use: Never used  Substance Use Topics   Alcohol use: Yes    Alcohol/week: 0.0 standard drinks    Comment: occasionally   Drug use: No   Allergies Patient has no known allergies.  Review of Systems Review of Systems  Cardiovascular:  Positive for chest pain.  Gastrointestinal:  Positive for abdominal pain, nausea and vomiting.   Physical Exam Vital Signs  I have reviewed the triage vital signs BP 139/89    Pulse (!) 134    Temp 99.3 F (37.4 C)    Resp 19    Ht _0  (1.778 m)    Wt 108.9 kg    SpO2 96%    BMI 34.44 kg/m   Physical Exam Vitals and nursing note reviewed.  Constitutional:      General: He is not in acute distress.    Appearance: He is well-developed.  HENT:     Head: Normocephalic and atraumatic.  Eyes:     Conjunctiva/sclera: Conjunctivae normal.  Cardiovascular:     Rate and Rhythm: Regular rhythm. Tachycardia present.     Heart sounds: No murmur heard. Pulmonary:     Effort: Pulmonary effort is normal. No respiratory distress.     Breath sounds: Normal breath sounds.  Abdominal:     Palpations: Abdomen is soft.     Tenderness: There is no abdominal tenderness.  Musculoskeletal:        General: No swelling.     Cervical back: Neck supple.  Skin:    General: Skin is warm and dry.     Capillary Refill: Capillary refill takes less than 2 seconds.  Neurological:     Mental  Status: He is  alert.  Psychiatric:        Mood and Affect: Mood normal.    ED Results and Treatments Labs (all labs ordered are listed, but only abnormal results are displayed) Labs Reviewed  CBC WITH DIFFERENTIAL/PLATELET - Abnormal; Notable for the following components:      Result Value   MCV 77.1 (*)    MCH 25.0 (*)    Neutro Abs 8.4 (*)    Lymphs Abs 0.3 (*)    All other components within normal limits  COMPREHENSIVE METABOLIC PANEL - Abnormal; Notable for the following components:   CO2 16 (*)    Glucose, Bld 308 (*)    Calcium 8.1 (*)    Albumin 3.4 (*)    All other components within normal limits  BETA-HYDROXYBUTYRIC ACID - Abnormal; Notable for the following components:   Beta-Hydroxybutyric Acid 1.17 (*)    All other components within normal limits  CBG MONITORING, ED - Abnormal; Notable for the following components:   Glucose-Capillary 301 (*)    All other components within normal limits  I-STAT VENOUS BLOOD GAS, ED - Abnormal; Notable for the following components:   pCO2, Ven 27.3 (*)    pO2, Ven 73 (*)    Bicarbonate 17.9 (*)    TCO2 19 (*)    Acid-base deficit 5.0 (*)    Calcium, Ion 1.08 (*)    All other components within normal limits  LIPASE, BLOOD  URINALYSIS, ROUTINE W REFLEX MICROSCOPIC  LACTIC ACID, PLASMA  LACTIC ACID, PLASMA  TROPONIN I (HIGH SENSITIVITY)  TROPONIN I (HIGH SENSITIVITY)                                                                                                                          Radiology No results found.  Pertinent labs & imaging results that were available during my care of the patient were reviewed by me and considered in my medical decision making (see MDM for details).  Medications Ordered in ED Medications  lactated ringers bolus 1,000 mL (1,000 mLs Intravenous New Bag/Given 03/24/21 1554)  lactated ringers bolus 1,000 mL (1,000 mLs Intravenous New Bag/Given 03/24/21 1712)  insulin aspart (novoLOG) injection 10 Units (10  Units Intravenous Given 03/24/21 1712)  Procedures .Critical Care Performed by: Teressa Lower, MD Authorized by: Teressa Lower, MD   Critical care provider statement:    Critical care time (minutes):  30   Critical care was necessary to treat or prevent imminent or life-threatening deterioration of the following conditions:  Dehydration   Critical care was time spent personally by me on the following activities:  Development of treatment plan with patient or surrogate, discussions with consultants, evaluation of patient's response to treatment, examination of patient, ordering and review of laboratory studies, ordering and review of radiographic studies, ordering and performing treatments and interventions, pulse oximetry, re-evaluation of patient's condition and review of old charts  (including critical care time)  Medical Decision Making / ED Course   This patient presents to the ED for concern of nausea, vomiting, tachycardia, this involves an extensive number of treatment options, and is a complaint that carries with it a high risk of complications and morbidity.  The differential diagnosis includes DKA, HHS, intra-abdominal infection, dehydration  MDM: Patient seen emergency department for evaluation of multiple complaints as described above.  Physical exam reveals patient with a regular tachycardia in the 150s but is otherwise unremarkable.  Abdomen soft and nontender, cardiopulmonary exam unremarkable.  Laboratory evaluation within normal pH of 7.42 but a bicarb of 16, initial blood sugar 308, beta hydroxybutyrate elevated to 1.17.  Patient given 2 L lactated Ringer's and 10 units of insulin due to concern for developing early DKA.  On reevaluation, patient heart rate remains in the upper 140s and clinically appears dehydrated.  I had a shared  decision-making discussion with the patient and due to patient's persistent tachycardia, we will admit the patient for overnight rehydration and observation of his hyperglycemia.   Additional history obtained: -Additional history obtained from wife -External records from outside source obtained and reviewed including: Chart review including previous notes, labs, imaging, consultation notes   Lab Tests: -I ordered, reviewed, and interpreted labs.   The pertinent results include:   Labs Reviewed  CBC WITH DIFFERENTIAL/PLATELET - Abnormal; Notable for the following components:      Result Value   MCV 77.1 (*)    MCH 25.0 (*)    Neutro Abs 8.4 (*)    Lymphs Abs 0.3 (*)    All other components within normal limits  COMPREHENSIVE METABOLIC PANEL - Abnormal; Notable for the following components:   CO2 16 (*)    Glucose, Bld 308 (*)    Calcium 8.1 (*)    Albumin 3.4 (*)    All other components within normal limits  BETA-HYDROXYBUTYRIC ACID - Abnormal; Notable for the following components:   Beta-Hydroxybutyric Acid 1.17 (*)    All other components within normal limits  CBG MONITORING, ED - Abnormal; Notable for the following components:   Glucose-Capillary 301 (*)    All other components within normal limits  I-STAT VENOUS BLOOD GAS, ED - Abnormal; Notable for the following components:   pCO2, Ven 27.3 (*)    pO2, Ven 73 (*)    Bicarbonate 17.9 (*)    TCO2 19 (*)    Acid-base deficit 5.0 (*)    Calcium, Ion 1.08 (*)    All other components within normal limits  LIPASE, BLOOD  URINALYSIS, ROUTINE W REFLEX MICROSCOPIC  LACTIC ACID, PLASMA  LACTIC ACID, PLASMA  TROPONIN I (HIGH SENSITIVITY)  TROPONIN I (HIGH SENSITIVITY)      EKG   EKG Interpretation  Date/Time:  Monday March 24 2021 15:21:54 EDT Ventricular Rate:  146  PR Interval:  103 QRS Duration: 78 QT Interval:  281 QTC Calculation: 438 R Axis:   -37 Text Interpretation: Sinus tachycardia Inferior infarct, old  Confirmed by Ruston Fedora ([id number]) on 03/24/2021 3:33:51 PM         Medicines ordered and prescription drug management: Meds ordered this encounter  Medications   lactated ringers bolus 1,000 mL   lactated ringers bolus 1,000 mL   insulin aspart (novoLOG) injection 10 Units    -I have reviewed the patients home medicines and have made adjustments as needed  Critical interventions Aggressive IVF rehydration   Cardiac Monitoring: The patient was maintained on a cardiac monitor.  I personally viewed and interpreted the cardiac monitored which showed an underlying rhythm of: Sinus tachycardia   Social Determinants of Health:  Factors impacting patients care include: none   Reevaluation: After the interventions noted above, I reevaluated the patient and found that they have :improved  Co morbidities that complicate the patient evaluation  Past Medical History:  Diagnosis Date   Depression    Diabetes mellitus without complication ([place])    Diabetes mellitus, type II ([place])    GERD (gastroesophageal reflux disease)    H. pylori infection    HTN (hypertension)    Hyperlipidemia    Hypogonadism male    Leg pain    Low magnesium levels    Neck pain    Rhinitis, allergic    Vitamin B12 deficiency    Vitamin D deficiency    White coat hypertension       Dispostion: I considered admission for this patient, and due to persistent tachycardia and need for rehydration in the setting of likely early DKA patient will be admitted     Final Clinical Impression(s) / ED Diagnoses Final diagnoses:  None     _0 @    Teressa Lower, MD 03/24/21 2312

## 2021-03-25 ENCOUNTER — Encounter (HOSPITAL_COMMUNITY): Payer: Self-pay | Admitting: Student

## 2021-03-25 DIAGNOSIS — E860 Dehydration: Secondary | ICD-10-CM

## 2021-03-25 DIAGNOSIS — Z7901 Long term (current) use of anticoagulants: Secondary | ICD-10-CM | POA: Diagnosis not present

## 2021-03-25 DIAGNOSIS — E782 Mixed hyperlipidemia: Secondary | ICD-10-CM | POA: Diagnosis not present

## 2021-03-25 DIAGNOSIS — R1084 Generalized abdominal pain: Secondary | ICD-10-CM | POA: Diagnosis not present

## 2021-03-25 DIAGNOSIS — R000 Tachycardia, unspecified: Secondary | ICD-10-CM | POA: Diagnosis not present

## 2021-03-25 DIAGNOSIS — Z7982 Long term (current) use of aspirin: Secondary | ICD-10-CM | POA: Diagnosis not present

## 2021-03-25 DIAGNOSIS — E785 Hyperlipidemia, unspecified: Secondary | ICD-10-CM | POA: Diagnosis not present

## 2021-03-25 DIAGNOSIS — I10 Essential (primary) hypertension: Secondary | ICD-10-CM | POA: Diagnosis not present

## 2021-03-25 DIAGNOSIS — Z794 Long term (current) use of insulin: Secondary | ICD-10-CM | POA: Diagnosis not present

## 2021-03-25 DIAGNOSIS — K219 Gastro-esophageal reflux disease without esophagitis: Secondary | ICD-10-CM | POA: Diagnosis not present

## 2021-03-25 DIAGNOSIS — R112 Nausea with vomiting, unspecified: Secondary | ICD-10-CM

## 2021-03-25 DIAGNOSIS — Z20822 Contact with and (suspected) exposure to covid-19: Secondary | ICD-10-CM | POA: Diagnosis not present

## 2021-03-25 DIAGNOSIS — Z79899 Other long term (current) drug therapy: Secondary | ICD-10-CM | POA: Diagnosis not present

## 2021-03-25 DIAGNOSIS — E1165 Type 2 diabetes mellitus with hyperglycemia: Secondary | ICD-10-CM | POA: Diagnosis not present

## 2021-03-25 LAB — CBC WITH DIFFERENTIAL/PLATELET
Abs Immature Granulocytes: 0.06 10*3/uL (ref 0.00–0.07)
Basophils Absolute: 0 10*3/uL (ref 0.0–0.1)
Basophils Relative: 0 %
Eosinophils Absolute: 0 10*3/uL (ref 0.0–0.5)
Eosinophils Relative: 0 %
HCT: 38.8 % — ABNORMAL LOW (ref 39.0–52.0)
Hemoglobin: 12.3 g/dL — ABNORMAL LOW (ref 13.0–17.0)
Immature Granulocytes: 1 %
Lymphocytes Relative: 8 %
Lymphs Abs: 0.6 10*3/uL — ABNORMAL LOW (ref 0.7–4.0)
MCH: 24.6 pg — ABNORMAL LOW (ref 26.0–34.0)
MCHC: 31.7 g/dL (ref 30.0–36.0)
MCV: 77.8 fL — ABNORMAL LOW (ref 80.0–100.0)
Monocytes Absolute: 0.2 10*3/uL (ref 0.1–1.0)
Monocytes Relative: 3 %
Neutro Abs: 6.7 10*3/uL (ref 1.7–7.7)
Neutrophils Relative %: 88 %
Platelets: 285 10*3/uL (ref 150–400)
RBC: 4.99 MIL/uL (ref 4.22–5.81)
RDW: 14.7 % (ref 11.5–15.5)
WBC: 7.6 10*3/uL (ref 4.0–10.5)
nRBC: 0 % (ref 0.0–0.2)

## 2021-03-25 LAB — BETA-HYDROXYBUTYRIC ACID: Beta-Hydroxybutyric Acid: 0.86 mmol/L — ABNORMAL HIGH (ref 0.05–0.27)

## 2021-03-25 LAB — BASIC METABOLIC PANEL
Anion gap: 11 (ref 5–15)
BUN: 16 mg/dL (ref 6–20)
CO2: 20 mmol/L — ABNORMAL LOW (ref 22–32)
Calcium: 8.1 mg/dL — ABNORMAL LOW (ref 8.9–10.3)
Chloride: 104 mmol/L (ref 98–111)
Creatinine, Ser: 0.8 mg/dL (ref 0.61–1.24)
GFR, Estimated: >60 mL/min (ref >60)
Glucose, Bld: 243 mg/dL — ABNORMAL HIGH (ref 70–99)
Potassium: 4 mmol/L (ref 3.5–5.1)
Sodium: 135 mmol/L (ref 135–145)

## 2021-03-25 LAB — MAGNESIUM: Magnesium: 2.1 mg/dL (ref 1.7–2.4)

## 2021-03-25 LAB — HEMOGLOBIN A1C
Hgb A1c MFr Bld: 11.3 % — ABNORMAL HIGH (ref 4.8–5.6)
Mean Plasma Glucose: 278 mg/dL

## 2021-03-25 LAB — GLUCOSE, CAPILLARY: Glucose-Capillary: 327 mg/dL — ABNORMAL HIGH (ref 70–99)

## 2021-03-25 LAB — HIV ANTIBODY (ROUTINE TESTING W REFLEX): HIV Screen 4th Generation wRfx: NONREACTIVE

## 2021-03-25 MED ORDER — METOPROLOL TARTRATE 25 MG PO TABS
25.0000 mg | ORAL_TABLET | Freq: Two times a day (BID) | ORAL | 0 refills | Status: DC
Start: 2021-03-25 — End: 2021-04-08

## 2021-03-25 MED ORDER — ENOXAPARIN SODIUM 60 MG/0.6ML IJ SOSY
55.0000 mg | PREFILLED_SYRINGE | INTRAMUSCULAR | Status: DC
  Filled 2021-03-25: qty 0.6

## 2021-03-25 NOTE — Plan of Care (Signed)
The patient is admitted to [address] and he was accompanied by his wife. A & O x 4. Denied any acute pain. Care plan reviewed with the patient and his wife and all questions were  answered. Will continue to monitor. ?

## 2021-03-25 NOTE — Discharge Instructions (Signed)
Dear Chad Haynes,  ? ?Thank you for letting us participate in your care! In this section, you will find a brief hospital admission summary of why you were admitted to the hospital, what happened during your admission, your diagnosis/diagnoses, and recommended follow up.  ?You were admitted because you were experiencing vomiting and dehydration associated to high glucose levels due to your diabetes. You were treated with fluids and insulin. Additionally, you were started on metoprolol twice daily for your fast heart rate.  ? ?POST-HOSPITAL & CARE INSTRUCTIONS ?Please follow up with endocrinology for your diabetes.  ?Please follow up with your cardiologist for your tachycardia and additionally address aspirin use. I would advise holding that medication for now. ?Please let PCP/Specialists know of any changes in medications that were made.  ?Please see medications section of this packet for any medication changes.  ? ?DOCTOR'S APPOINTMENTS & FOLLOW UP ?Future Appointments  ?Date Time Provider [place]  ?03/27/2021 10:30 AM Brita Romp, NP [facility] None  ?04/08/2021  8:00 AM Loel Dubonnet, NP [facility]  ? ? ? ?Thank you for choosing [facility]! Take care and be well! ? ?Family Medicine Teaching Service Inpatient Team ?[place]  ?[facility]  ?[address] [place], [place] [address] ?[phone] ? ?

## 2021-03-25 NOTE — Plan of Care (Signed)
?  Problem: Education: ?Goal: Knowledge of General Education information will improve ?Description: Including pain rating scale, medication(s)/side effects and non-pharmacologic comfort measures ?Outcome: Adequate for Discharge ?  ?Problem: Health Behavior/Discharge Planning: ?Goal: Ability to manage health-related needs will improve ?Outcome: Adequate for Discharge ?  ?Problem: Clinical Measurements: ?Goal: Ability to maintain clinical measurements within normal limits will improve ?Outcome: Adequate for Discharge ?Goal: Will remain free from infection ?Outcome: Adequate for Discharge ?Goal: Diagnostic test results will improve ?Outcome: Adequate for Discharge ?Goal: Respiratory complications will improve ?Outcome: Adequate for Discharge ?Goal: Cardiovascular complication will be avoided ?Outcome: Adequate for Discharge ?  ?Problem: Education: ?Goal: Ability to describe self-care measures that may prevent or decrease complications (Diabetes Survival Skills Education) will improve ?Outcome: Adequate for Discharge ?Goal: Individualized Educational Video(s) ?Outcome: Adequate for Discharge ?  ?Problem: Coping: ?Goal: Ability to adjust to condition or change in health will improve ?Outcome: Adequate for Discharge ?  ?Problem: Fluid Volume: ?Goal: Ability to maintain a balanced intake and output will improve ?Outcome: Adequate for Discharge ?  ?Problem: Health Behavior/Discharge Planning: ?Goal: Ability to identify and utilize available resources and services will improve ?Outcome: Adequate for Discharge ?Goal: Ability to manage health-related needs will improve ?Outcome: Adequate for Discharge ?  ?Problem: Metabolic: ?Goal: Ability to maintain appropriate glucose levels will improve ?Outcome: Adequate for Discharge ?  ?Problem: Tissue Perfusion: ?Goal: Adequacy of tissue perfusion will improve ?Outcome: Adequate for Discharge ?  ?

## 2021-03-25 NOTE — Discharge Summary (Signed)
Family Medicine Teaching Service ?Hospital Discharge Summary ? ?Patient name: Chad Haynes Medical record number: [id number] ?Date of birth: 05/27/1965 Age: 56 y.o. Gender: male ?Date of Admission: 03/24/2021  Date of Discharge: 03/25/2021 ?Admitting Physician: Chad Guiles, DO ? ?Primary Care Provider: [facility] ?Consultants: None ? ?Indication for Hospitalization: Hyperglycemia ? ?Discharge Diagnoses/Problem List:  ?Principal Problem: ?  Hyperglycemia due to type 2 diabetes mellitus ([place]) ?Active Problems: ?  GERD (gastroesophageal reflux disease) ?  Hyperlipidemia ?  Uncontrolled type 2 diabetes mellitus with hyperglycemia ([place]) ?  Hypertension ?  Regular persistent sinus tachycardia ?  Dehydration ?  Intractable vomiting with nausea ?  ? ?Disposition: Home ? ?Discharge Condition: Stable ? ?Discharge Exam:  ?Blood pressure 125/78, pulse (!) 102, temperature 97.6 ?F (36.4 ?C), temperature source Oral, resp. rate 18, height '5\' 10"'  (1.778 m), weight 112.2 kg, SpO2 96 %.  ?General: awake, alert, NAD ?CV: Tachycardic, normal rhythm, no murmurs auscultated ?Pulm: CTA B, no increased work of breathing ?Abdomen: Normoactive bowel sounds ? ?Brief Hospital Course:  ?Chad Haynes is a 56 y.o.male with a history of T2DM, HTN, HLD, GERD, depression who was admitted to the family practice teaching Service at [facility] for abdominal pain and vomiting with hyperglycemia. His hospital course is detailed below: ? ?Poorly controlled T2DM  hyperglycemia ?Presented to the ED with abdominal pain and consequent episodes of vomiting which resolved in the ED after receiving 1 L LR x2 boluses.  BHB 1.17, glucose in the 300s, CO2 16 with normal anion gap.  Lactic acid 2.2 with CBC unremarkable with the exception of ANC 8.4.  Hemodynamically stable with tachycardia that improved with fluid boluses.  Troponin and lipase unremarkable.  Patient was admitted for observation and discharged the next morning with improved BMP and  glucose in the 200s.  Patient was discharged on home diabetic regimen advised to follow-up with endocrinology in the same week. ? ?Tachycardia ?Presented in the 150s which somewhat improved after fluid boluses.  No explanation for tachycardia but patient endorsed always being tachycardic and being primarily cleared by cardiology.  No documentation of this while inpatient was cleared for surgery by cardiology.  Started on metoprolol 25 mg twice daily and discharged with advice to follow-up for tachycardia with cardiology. ? ?Other chronic conditions were medically managed with home medications and formulary alternatives as necessary (HTN, HLD, GERD, depression) ? ?PCP Follow-up Recommendations: ?Endocrinology follow-up for diabetes regimem ?Cardiology follow-up for tachycardia ?Cardiology or PCP follow-up for aspirin use for unclear indication ? ? ?Significant Procedures: None ? ?Significant Labs and Imaging:  ?Recent Labs  ?Lab 03/24/21 ?1532 03/24/21 ?1600 03/25/21 ?0111  ?WBC 9.1  --  7.6  ?HGB 13.4 14.3 12.3*  ?HCT 41.4 42.0 38.8*  ?PLT 368  --  285  ? ?Recent Labs  ?Lab 03/24/21 ?1532 03/24/21 ?1600 03/24/21 ?2112 03/25/21 ?0111  ?NA 137 138 135 135  ?K 4.1 4.2 4.2 4.0  ?CL 108  --  104 104  ?CO2 16*  --  18* 20*  ?GLUCOSE 308*  --  295* 243*  ?BUN 18  --  18 16  ?CREATININE 0.90  --  0.90 0.80  ?CALCIUM 8.1*  --  8.2* 8.1*  ?MG  --   --  1.4* 2.1  ?ALKPHOS 79  --   --   --   ?AST 19  --   --   --   ?ALT 19  --   --   --   ?ALBUMIN 3.4*  --   --   --   ? ?  CBG (last 3)  ?Recent Labs  ?  03/24/21 ?2133 03/24/21 ?2308 03/25/21 ?0824  ?GLUCAP 261* 265* 327*  ? ?Mag: 2.1 ? ?Results/Tests Pending at Time of Discharge: A1c ? ?Discharge Medications:  ?Allergies as of 03/25/2021   ?No Known Allergies ?  ? ?  ?Medication List  ?  ? ?STOP taking these medications   ? ?aspirin 81 MG tablet ?  ?omeprazole 40 MG capsule ?Commonly known as: PRILOSEC ?  ? ?  ? ?TAKE these medications   ? ?BD Pen Needle Nano 2nd Gen 32G X 4 MM  Misc ?Generic drug: Insulin Pen Needle ?USE AS DIRECTED WITH INSULIN ?  ?FreeStyle Libre 14 Day Sensor Misc ?Use as directed to monitor glucose continuously. Change sensor Q 14 days. Dx: E11.65. ?  ?glipiZIDE 5 MG tablet ?Commonly known as: GLUCOTROL ?TAKE 1 TABLET BY MOUTH TWICE A DAY BEFORE A MEAL/PT NEEDS OV FOR FURTHER REFILLS ?  ?Insulin Syringe-Needle U-100 31G X 5/16" 0.3 ML Misc ?Commonly known as: BD Insulin Syringe U/F ?USE WITH SLIDING SCALE INSULIN ?  ?Levemir FlexTouch 100 UNIT/ML FlexPen ?Generic drug: insulin detemir ?INJECT 20 UNITS IN THE MORNING AND 20 UNITS IN THE EVENING, SUBCUTANEOUSLY ?What changed: See the new instructions. ?  ?lisinopril 10 MG tablet ?Commonly known as: ZESTRIL ?Take 1 tablet (10 mg total) by mouth daily. ?  ?metFORMIN 1000 MG tablet ?Commonly known as: GLUCOPHAGE ?TAKE 1 TABLET (1,000 MG TOTAL) BY MOUTH 2 (TWO) TIMES DAILY WITH A MEAL ?  ?metoprolol tartrate 25 MG tablet ?Commonly known as: LOPRESSOR ?Take 1 tablet (25 mg total) by mouth 2 (two) times daily. ?  ?NovoLIN R 100 units/mL injection ?Generic drug: insulin regular ?SLIDING SCALE 150-200=2UNITS,201-250=4 U,251-300=6U,301-350=8U,351-400=10 U,401-450=12U & MAKE APPT ?  ?omeprazole 20 MG tablet ?Commonly known as: PRILOSEC OTC ?Take 20 mg by mouth daily. ?  ?PARoxetine 20 MG tablet ?Commonly known as: PAXIL ?Take 1 tablet (20 mg total) by mouth daily. ?  ?QuickVue At-Home Covid-19 Test Kit ?Generic drug: COVID-19 At Home Antigen Test ?See admin instructions. ?  ?rosuvastatin 40 MG tablet ?Commonly known as: CRESTOR ?Take 1 tablet (40 mg total) by mouth daily. ?  ? ?  ? ? ?Discharge Instructions: Please refer to Patient Instructions section of EMR for full details.  Patient was counseled important signs and symptoms that should prompt return to medical care, changes in medications, dietary instructions, activity restrictions, and follow up appointments.  ? ?Follow-Up Appointments: ?Future Appointments  ?Date Time  Provider [place]  ?03/27/2021 10:30 AM Brita Romp, NP [facility] None  ?04/08/2021  8:00 AM Loel Dubonnet, NP [facility]  ? ? ?Chad Guiles, DO ?03/25/2021, 9:39 AM ?PGY-1, [place] ? ?

## 2021-03-25 NOTE — Progress Notes (Signed)
FPTS Interim Night Progress Note ? ?S:Patient sleeping comfortably.  Rounded with primary night RN.  No concerns voiced.  No orders required.   ? ?O: ?Today's Vitals  ? 03/24/21 2257 03/24/21 2322 03/25/21 0000 03/25/21 0401  ?BP:  123/78 134/70 (!) 151/83  ?Pulse:      ?Resp:  20  20  ?Temp:  98.9 ?F (37.2 ?C)  98.9 ?F (37.2 ?C)  ?TempSrc:  Oral  Oral  ?SpO2:  98%  98%  ?Weight:  112.2 kg    ?Height:      ?PainSc: 0-No pain     ? ? ? ? ?A/P: ?Continue current management ? ? ?Carollee Leitz MD ?PGY-3, [facility] ?Service pager [phone]   ?

## 2021-03-25 NOTE — Progress Notes (Signed)
?  Transition of Care (TOC) Screening Note ? ? ?Patient Details  ?Name: Chad Haynes ?Date of Birth: October 18, 1965 ? ? ?Transition of Care (TOC) CM/SW Contact:    ?Benard Halsted, LCSW ?Phone Number: ?03/25/2021, 8:48 AM ? ? ? ?Transition of Care Department [facility]) has reviewed patient and no TOC needs have been identified at this time. We will continue to monitor patient advancement through interdisciplinary progression rounds. If new patient transition needs arise, please place a TOC consult. ? ? ?

## 2021-03-27 ENCOUNTER — Other Ambulatory Visit: Payer: Self-pay

## 2021-03-27 ENCOUNTER — Encounter: Payer: Self-pay | Admitting: Nurse Practitioner

## 2021-03-27 ENCOUNTER — Telehealth: Payer: Self-pay

## 2021-03-27 ENCOUNTER — Ambulatory Visit (INDEPENDENT_AMBULATORY_CARE_PROVIDER_SITE_OTHER): Payer: BC Managed Care – PPO | Admitting: Nurse Practitioner

## 2021-03-27 VITALS — BP 115/70 | HR 84 | Ht 70.25 in | Wt 243.0 lb

## 2021-03-27 DIAGNOSIS — I10 Essential (primary) hypertension: Secondary | ICD-10-CM | POA: Diagnosis not present

## 2021-03-27 DIAGNOSIS — E782 Mixed hyperlipidemia: Secondary | ICD-10-CM | POA: Diagnosis not present

## 2021-03-27 DIAGNOSIS — E1165 Type 2 diabetes mellitus with hyperglycemia: Secondary | ICD-10-CM | POA: Diagnosis not present

## 2021-03-27 MED ORDER — OZEMPIC (0.25 OR 0.5 MG/DOSE) 2 MG/1.5ML ~~LOC~~ SOPN: 0.5000 mg | PEN_INJECTOR | SUBCUTANEOUS | 3 refills | Status: DC

## 2021-03-27 MED ORDER — INSULIN REGULAR HUMAN 100 UNIT/ML IJ SOLN: 10.0000 [IU] | Freq: Three times a day (TID) | INTRAMUSCULAR | 11 refills | Status: DC

## 2021-03-27 NOTE — Telephone Encounter (Signed)
Started prior authorization for Ozempic. ?Key # [id number] ?

## 2021-03-27 NOTE — Patient Instructions (Signed)

## 2021-03-27 NOTE — Progress Notes (Signed)
? ?                                                    Endocrinology Follow Up Note  ?     03/27/2021, 11:11 AM ? ? ?Subjective:  ? ? Patient ID: Chad Haynes, male    DOB: 01/01/1966.  ?Chad Haynes is being seen in follow up after being seen in consultation for management of currently uncontrolled symptomatic diabetes requested by  Center, [facility]. ? ? ?Past Medical History:  ?Diagnosis Date  ? Depression   ? Diabetes mellitus without complication ([place])   ? Diabetes mellitus, type II ([place])   ? GERD (gastroesophageal reflux disease)   ? H. pylori infection   ? HTN (hypertension)   ? Hyperlipidemia   ? Hypogonadism male   ? Leg pain   ? Low magnesium levels   ? Neck pain   ? Rhinitis, allergic   ? Vitamin B12 deficiency   ? Vitamin D deficiency   ? White coat hypertension   ? ? ?Past Surgical History:  ?Procedure Laterality Date  ? KNEE ARTHROSCOPY Right   ? ? ?Social History  ? ?Socioeconomic History  ? Marital status: Married  ?  Spouse name: Not on file  ? Number of children: Not on file  ? Years of education: Not on file  ? Highest education level: Not on file  ?Occupational History  ? Not on file  ?Tobacco Use  ? Smoking status: Former  ?  Types: Cigarettes  ?  Quit date: 06/08/1991  ?  Years since quitting: 29.8  ? Smokeless tobacco: Never  ?Vaping Use  ? Vaping Use: Never used  ?Substance and Sexual Activity  ? Alcohol use: Yes  ?  Alcohol/week: 0.0 standard drinks  ?  Comment: occasionally  ? Drug use: No  ? Sexual activity: Yes  ?Other Topics Concern  ? Not on file  ?Social History Narrative  ? Married. No children. Does not formal exercise.  ? In school at [employer] full-time. Works part-time job at night.  ? "Has to walk a mile from the car to [place]"  ? ?Social Determinants of Health  ? ?Financial Resource Strain: Not on file  ?Food Insecurity: Not on file  ?Transportation Needs: Not on file  ?Physical Activity: Not on file  ?Stress: Not on file  ?Social Connections:  Not on file  ? ? ?Family History  ?Problem Relation Age of Onset  ? COPD Mother   ?     smoker  ? Hypertension Father   ? ? ?Outpatient Encounter Medications as of 03/27/2021  ?Medication Sig  ? BD PEN NEEDLE NANO 2ND GEN 32G X 4 MM MISC USE AS DIRECTED WITH INSULIN  ? Continuous Blood Gluc Sensor (FREESTYLE LIBRE 14 DAY SENSOR) MISC Use as directed to monitor glucose continuously. Change sensor Q 14 days. Dx: E11.65.  ? glipiZIDE (GLUCOTROL) 5 MG tablet TAKE 1 TABLET BY MOUTH TWICE A DAY BEFORE A MEAL/PT NEEDS OV FOR FURTHER REFILLS  ? Insulin Syringe-Needle U-100 (BD INSULIN SYRINGE U/F) 31G X 5/16" 0.3 ML MISC USE WITH SLIDING SCALE INSULIN  ? LEVEMIR FLEXTOUCH 100 UNIT/ML FlexTouch Pen INJECT 20 UNITS IN THE MORNING AND 20 UNITS IN THE EVENING, SUBCUTANEOUSLY (Patient taking differently: Inject 50 Units into the skin at bedtime.)  ?  lisinopril (ZESTRIL) 10 MG tablet Take 1 tablet (10 mg total) by mouth daily.  ? metFORMIN (GLUCOPHAGE) 1000 MG tablet TAKE 1 TABLET (1,000 MG TOTAL) BY MOUTH 2 (TWO) TIMES DAILY WITH A MEAL  ? metoprolol tartrate (LOPRESSOR) 25 MG tablet Take 1 tablet (25 mg total) by mouth 2 (two) times daily.  ? omeprazole (PRILOSEC OTC) 20 MG tablet Take 20 mg by mouth daily.  ? PARoxetine (PAXIL) 20 MG tablet Take 1 tablet (20 mg total) by mouth daily.  ? QUICKVUE AT-HOME COVID-19 TEST KIT See admin instructions.  ? rosuvastatin (CRESTOR) 40 MG tablet Take 1 tablet (40 mg total) by mouth daily.  ? Semaglutide,0.25 or 0.5MG /DOS, (OZEMPIC, 0.25 OR 0.5 MG/DOSE,) 2 MG/1.5ML SOPN Inject 0.5 mg into the skin once a week.  ? [DISCONTINUED] NOVOLIN R 100 UNIT/ML injection SLIDING SCALE 150-200=2UNITS,201-250=4 U,251-300=6U,301-350=8U,351-400=10 U,401-450=12U & MAKE APPT  ? insulin regular (NOVOLIN R) 100 units/mL injection Inject 0.1-0.16 mLs (10-16 Units total) into the skin 3 (three) times daily before meals.  ? ?No facility-administered encounter medications on file as of 03/27/2021.   ? ? ?ALLERGIES: ?No Known Allergies ? ?VACCINATION STATUS: ?Immunization History  ?Administered Date(s) Administered  ? PFIZER(Purple Top)SARS-COV-2 Vaccination 04/13/2019, 05/13/2019, 12/17/2019  ? Pneumococcal Polysaccharide-23 01/17/2014  ? Tdap 05/07/2016  ? ? ?Diabetes ?He presents for his follow-up diabetic visit. He has type 2 diabetes mellitus. Onset time: was diagnosed at approx age of 39. His disease course has been stable. There are no hypoglycemic associated symptoms. (Gets symptoms of hypoglycemia when glucose reaches 100) Associated symptoms include blurred vision, fatigue, polydipsia and polyuria. There are no hypoglycemic complications. Symptoms are stable. Diabetic complications include impotence. 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 Current diabetic treatment includes intensive insulin program and oral agent (dual therapy). He is compliant with treatment most of the time (skips his Novolin R sometimes). His weight is fluctuating minimally. He is following a generally unhealthy diet. When asked about meal planning, he reported none. He has had a previous visit with a dietitian (Did not find it beneficial). He rarely participates in exercise. His home blood glucose trend is fluctuating minimally. His overall blood glucose range is >200 mg/dl. (He presents today with his CGM showing above target glycemic profile overall.  He was not due for another A1c today , recently checked during hospitalization on 3/13 was 11.3%, improving from last visit of 12.8%.  He admits he still has not been taking his Novolin R consistently, but has started recently to use it more and more.  He denies any hypoglycemia.  Analysis of his CGM shows TIR 21%, TAR 79%, TBR 0%.  ) An ACE inhibitor/angiotensin II  receptor blocker is being taken. He does not see a podiatrist.Eye exam is current.  ?Hyperlipidemia ?This is a chronic problem. The current episode started more than 1 year ago. The problem is controlled. Recent lipid tests were reviewed and are normal. Exacerbating diseases include diabetes and obesity. Factors aggravating his hyperlipidemia include fatty foods. Current antihyperlipidemic treatment includes statins. The current treatment provides moderate improvement of lipids. Compliance problems include adherence to diet and adherence to exercise.  Risk factors for coronary artery disease include diabetes mellitus, dyslipidemia, hypertension, male sex, obesity and a sedentary lifestyle.  ?Hypertension ?This is a chronic problem. The current episode started more than 1 year ago. The problem has been gradually  improving since onset. The problem is controlled. Associated symptoms include blurred vision. There are no associated agents to hypertension. Risk factors for coronary artery disease include diabetes mellitus, dyslipidemia, family history, male gender, obesity and sedentary lifestyle. Past treatments include ACE inhibitors. The current treatment provides mild improvement. Compliance problems include diet and exercise.   ? ? ?Review of systems ? ?Constitutional: + Minimally fluctuating body weight,  current Body mass index is 34.62 kg/m?. , + fatigue, no subjective hyperthermia, no subjective hypothermia ?Eyes: + blurry vision, no xerophthalmia ?ENT: no sore throat, no nodules palpated in throat, no dysphagia/odynophagia, no hoarseness ?Cardiovascular: no chest pain, no shortness of breath, no palpitations, no leg swelling ?Respiratory: no cough, no shortness of breath ?Gastrointestinal: no nausea/vomiting/diarrhea ?Musculoskeletal: right knee pain- awaiting clearance for surgery ?Skin: no rashes, no hyperemia ?Neurological: no tremors, no numbness, no tingling, no dizziness ?Psychiatric: no depression, no  anxiety ? ?Objective:  ?  ? ?BP 115/70   Pulse 84   Ht 5' 10.25" (1.784 m)   Wt 243 lb (110.2 kg)   SpO2 98%   BMI 34.62 kg/m?   ?Wt Readings from Last 3 Encounters:  ?03/27/21 243 lb (110.2 kg)  ?03/24/21 247 lb 5.7 oz (112.2 kg

## 2021-03-28 DIAGNOSIS — M5386 Other specified dorsopathies, lumbar region: Secondary | ICD-10-CM | POA: Diagnosis not present

## 2021-03-28 DIAGNOSIS — M9901 Segmental and somatic dysfunction of cervical region: Secondary | ICD-10-CM | POA: Diagnosis not present

## 2021-03-28 DIAGNOSIS — M9903 Segmental and somatic dysfunction of lumbar region: Secondary | ICD-10-CM | POA: Diagnosis not present

## 2021-03-28 DIAGNOSIS — M5032 Other cervical disc degeneration, mid-cervical region, unspecified level: Secondary | ICD-10-CM | POA: Diagnosis not present

## 2021-04-08 ENCOUNTER — Encounter (HOSPITAL_BASED_OUTPATIENT_CLINIC_OR_DEPARTMENT_OTHER): Payer: Self-pay | Admitting: Family

## 2021-04-08 ENCOUNTER — Ambulatory Visit (INDEPENDENT_AMBULATORY_CARE_PROVIDER_SITE_OTHER): Payer: BC Managed Care – PPO | Admitting: Family

## 2021-04-08 ENCOUNTER — Other Ambulatory Visit: Payer: Self-pay

## 2021-04-08 VITALS — BP 128/68 | HR 95 | Ht 70.0 in | Wt 244.4 lb

## 2021-04-08 DIAGNOSIS — I25118 Atherosclerotic heart disease of native coronary artery with other forms of angina pectoris: Secondary | ICD-10-CM | POA: Diagnosis not present

## 2021-04-08 DIAGNOSIS — E785 Hyperlipidemia, unspecified: Secondary | ICD-10-CM

## 2021-04-08 DIAGNOSIS — I77810 Thoracic aortic ectasia: Secondary | ICD-10-CM

## 2021-04-08 DIAGNOSIS — E1165 Type 2 diabetes mellitus with hyperglycemia: Secondary | ICD-10-CM

## 2021-04-08 DIAGNOSIS — I10 Essential (primary) hypertension: Secondary | ICD-10-CM | POA: Diagnosis not present

## 2021-04-08 MED ORDER — METOPROLOL SUCCINATE ER 25 MG PO TB24: 25.0000 mg | ORAL_TABLET | Freq: Every day | ORAL | 3 refills | Status: DC

## 2021-04-08 MED ORDER — LISINOPRIL 10 MG PO TABS: 10.0000 mg | ORAL_TABLET | Freq: Every day | ORAL | 3 refills | Status: AC

## 2021-04-08 NOTE — Progress Notes (Signed)
? ?Office Visit  ?  ?Patient Name: Chad Haynes ?Date of Encounter: 04/08/2021 ? ?PCP:  [facility] [place] ?  ?[place]  ?Cardiologist:  Freada Bergeron, MD  ?Advanced Practice Provider:  No care team member to display ?Electrophysiologist:  None  ?   ? ?Chief Complaint  ?  ?Chad Haynes is a 56 y.o. male with a hx of DM2, coronary artery disease, hypertension, aortic root dilation, hyperlipidemia, GERD, depression presents today for hospital follow-up ? ?Past Medical History  ?  ?Past Medical History:  ?Diagnosis Date  ? Depression   ? Diabetes mellitus without complication ([place])   ? Diabetes mellitus, type II ([place])   ? GERD (gastroesophageal reflux disease)   ? H. pylori infection   ? HTN (hypertension)   ? Hyperlipidemia   ? Hypogonadism male   ? Leg pain   ? Low magnesium levels   ? Neck pain   ? Rhinitis, allergic   ? Vitamin B12 deficiency   ? Vitamin D deficiency   ? White coat hypertension   ? ?Past Surgical History:  ?Procedure Laterality Date  ? KNEE ARTHROSCOPY Right   ? ? ?Allergies ? ?No Known Allergies ? ?History of Present Illness  ?  ?Chad Haynes is a 56 y.o. male with a hx of  DM2, coronary artery disease, hypertension, aortic root dilation, hyperlipidemia, GERD, depression last seen 04/10/2020 by Dr. Johney Frame. ? ?He was seen in consult 04/10/2020 by Dr. Johney Frame due to worsening dyspnea with family history notable for coronary disease in his father (s/p CABG at age 63) and paternal grandfather with massive MI at 47 (deceased at that time).  Cardiac CTA and echo were recommended.  Echo 05/14/2020 normal LVEF 55 to 60%, tricuspid aortic valve with trivial regurgitation, mild dilation aortic root 39 mm.  Cardiac CTA 05/2020 with coronary calcium score 169 placing him in the 89th percentile for age, sex, race matched control.  There is mild nonobstructive coronary disease (25-29%) in multiple vessels. ? ?He was admitted 3/13-3/14/2023 after presenting  with abdominal pain and vomiting.  His DM2 was found to be poorly controlled with glucose in the 300s.  He was tachycardic as high as 150s which improved after fluid bolus. ? ?He presents today for follow-up. Doing well since discharge. Reports no shortness of breath nor dyspnea on exertion. Reports no chest pain, pressure, or tightness. No edema, orthopnea, PND. Reports no palpitations.  HR at home often in the 80s since addition of Metoprolol by ED provider. He is interested in switching to extended release for daily dosing. No formal exercise routine but plans to start walking. 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  ? ?EKGs/Labs/Other Studies Reviewed:  ? ?The following studies were reviewed today: ? ?Echo 05/2020 ? ? 1. Left ventricular ejection fraction, by estimation, is 55 to 60%. The  ?left ventricle has normal function. The left ventricle has no regional  ?wall motion abnormalities. Left ventricular diastolic parameters were  ?normal.  ? 2. Right ventricular systolic function is normal. The right ventricular  ?size is normal. Tricuspid regurgitation signal is inadequate for assessing  ?PA pressure.  ? 3. The mitral valve is grossly normal. Trivial mitral valve  ?regurgitation. No evidence of mitral stenosis.  ? 4. The aortic valve is tricuspid. Aortic valve regurgitation is trivial.  ?No aortic stenosis is present.  ? 5. Aortic dilatation noted. There is mild dilatation of the aortic root,  ?measuring  39 mm.  ? 6. The inferior vena cava is normal in size with greater than 50%  ?respiratory variability, suggesting right atrial pressure of 3 mmHg.  ? ?Cardiac CTA 05/14/2020 ?Aorta:  Normal size.  No calcifications.  No dissection. ?  ?Sinus of Valsalva: Mild dilation 41 mm. ?  ?Main Pulmonary Artery: Normal size of the pulmonary artery. ?  ?Aortic Valve:  Tri-leaflet.  No calcifications. ?  ?Coronary Arteries:  Normal coronary origin.  Right dominance. ?  ?Coronary Calcium Score: ?  ?Left  main: 6 ?  ?Left anterior descending artery: 38 ?  ?Left circumflex artery and Ramus Intermedius: 55 ?  ?Right coronary artery: 71 ?  ?Total: 169 ?  ?Percentile: 89th for age, sex, and race matched control. ?  ?RCA is a large dominant artery that gives rise to PDA and PLA. There ?is a minimal non obstructive (1-24%) calcified plaque in the ?proximal vessel. There is a mild non obstructive (24-49%) calcified ?plaque in the mid vessel. ?  ?Left main is a large artery that gives rise to LAD and LCX arteries. ?There is no plaque. ?  ?LAD is a large vessel that gives rise to one large D1 Branch that ?bifurcates and a small D2 vessel. There are a multiple minimal non ?obstructive (1-24%) calcified plaques in the mid vessel. There is a ?mild non obstructive (24-49%) calcified plaque in the ostial D1 ?vessel. There is a minimal non obstructive (1-24%) calcified plaque ?in the D1 mid vessel. ?  ?LCX is a non-dominant artery that gives rise to one OM1 vessel. ?There is a minimal non obstructive (1-24%) calcified plaque in the ?proximal vessel. ?  ?There is a ramus intermedius vessel with a minimal non obstructive ?(1-24%) calcified plaque in the mid vessel. The Glagov effect is ?noted. ?  ?Other findings: ?  ?Normal pulmonary vein drainage into the left atrium. ?  ?Normal left atrial appendage without a thrombus. ?  ?Incidental left atrial diverticulum noted. ?  ?Extra-cardiac findings: See attached radiology report for ?non-cardiac structures. ?  ?IMPRESSION: ?1. Coronary calcium score of 169. This was 89th percentile for age, ?sex, and race matched control. ?  ?2. Normal coronary origin with right dominance. ?  ?3. CAD-RADS 2. Mild non-obstructive CAD (25-49%). Consider ?non-atherosclerotic causes of chest pain. Consider preventive ?therapy and risk factor modification. ?  ?4. Sinus of Valsalva: Mild dilation at 41 mm. Consider secondary ?imaging modality (echocardiogram, CTA Aorta Protocol, or MRA Aorta ?Protocol) if  clinically indicated. ? ?EKG:  No EKG today. ? ?Recent Labs: ?03/24/2021: ALT 19 ?03/25/2021: BUN 16; Creatinine, Ser 0.80; Hemoglobin 12.3; Magnesium 2.1; Platelets 285; Potassium 4.0; Sodium 135  ?Recent Lipid Panel ?   ?Component Value Date/Time  ? CHOL 136 05/14/2020 0822  ? TRIG 179 (H) 05/14/2020 1610  ? HDL 27 (L) 05/14/2020 9604  ? CHOLHDL 5.0 05/14/2020 0822  ? CHOLHDL 7.6 (H) 02/27/2019 1602  ? VLDL 38 (H) 05/07/2016 0940  ? [place] 78 05/14/2020 0822  ? [place]  02/27/2019 1602  ?   Comment:  ?   . ?LDL cholesterol not calculated. Triglyceride levels ?greater than 400 mg/dL invalidate calculated LDL results. ?. ?Reference range: <100 ?Marland Kitchen ?Desirable range <100 mg/dL for primary prevention;   ?<70 mg/dL for patients with CHD or diabetic patients  ?with > or = 2 CHD risk factors. ?. ?LDL-C is now calculated using the Martin-Hopkins  ?calculation, which is a validated novel method providing  ?better accuracy than the Friedewald equation in the  ?estimation  of LDL-C.  ?Cresenciano Genre et al. Annamaria Helling. 2585;277(82): 2061-2068  ?(http://education.QuestDiagnostics.com/faq/FAQ164) ?  ? LDLDIRECT 68 03/15/2020 1159  ? ? ?Home Medications  ? ?Current Meds  ?Medication Sig  ? BD PEN NEEDLE NANO 2ND GEN 32G X 4 MM MISC USE AS DIRECTED WITH INSULIN  ? Continuous Blood Gluc Sensor (FREESTYLE LIBRE 14 DAY SENSOR) MISC Use as directed to monitor glucose continuously. Change sensor Q 14 days. Dx: E11.65.  ? glipiZIDE (GLUCOTROL) 5 MG tablet TAKE 1 TABLET BY MOUTH TWICE A DAY BEFORE A MEAL/PT NEEDS OV FOR FURTHER REFILLS  ? insulin regular (NOVOLIN R) 100 units/mL injection Inject 0.1-0.16 mLs (10-16 Units total) into the skin 3 (three) times daily before meals.  ? Insulin Syringe-Needle U-100 (BD INSULIN SYRINGE U/F) 31G X 5/16" 0.3 ML MISC USE WITH SLIDING SCALE INSULIN  ? LEVEMIR FLEXTOUCH 100 UNIT/ML FlexTouch Pen INJECT 20 UNITS IN THE MORNING AND 20 UNITS IN THE EVENING, SUBCUTANEOUSLY (Patient taking differently: Inject 50 Units  into the skin at bedtime.)  ? metFORMIN (GLUCOPHAGE) 1000 MG tablet TAKE 1 TABLET (1,000 MG TOTAL) BY MOUTH 2 (TWO) TIMES DAILY WITH A MEAL  ? metoprolol succinate (TOPROL XL) 25 MG 24 hr tablet Take 1 tablet (2

## 2021-04-08 NOTE — Patient Instructions (Signed)
Medication Instructions:  ?Your physician has recommended you make the following change in your medication:  ? ?STOP Metoprolol Tartrate ? ?START Metoprolol Succinate '25mg'$  daily ?*this helps lower heart rate, relax the heart, lower cardiovascular risk  ? ?*If you need a refill on your cardiac medications before your next appointment, please call your pharmacy* ? ?Lab Work: ?Your physician recommends that you return for lab work today: lipid panel, CMET ? ?If you have labs (blood work) drawn today and your tests are completely normal, you will receive your results only by: ?MyChart Message (if you have MyChart) OR ?A paper copy in the mail ?If you have any lab test that is abnormal or we need to change your treatment, we will call you to review the results. ? ? ?Testing/Procedures: ?Your provider has recommended a CT aorta in May to follow the mild dilation of your aorta ? ? ?Follow-Up: ?At [facility], you and your health needs are our priority.  As part of our continuing mission to provide you with exceptional heart care, we have created designated Provider Care Teams.  These Care Teams include your primary Cardiologist (physician) and Advanced Practice Providers (APPs -  Physician Assistants and Nurse Practitioners) who all work together to provide you with the care you need, when you need it. ? ?We recommend signing up for the patient portal called "MyChart".  Sign up information is provided on this After Visit Summary.  MyChart is used to connect with patients for Virtual Visits (Telemedicine).  Patients are able to view lab/test results, encounter notes, upcoming appointments, etc.  Non-urgent messages can be sent to your provider as well.   ?To learn more about what you can do with MyChart, go to [email or web address].   ? ?Your next appointment:   ?6 month(s) ? ?The format for your next appointment:   ?In Person ? ?Provider:   ?Freada Bergeron, MD  or Loel Dubonnet, NP   ? ? ?Other  Instructions ? ?Heart Healthy Diet Recommendations: ?A low-salt diet is recommended. Meats should be grilled, baked, or boiled. Avoid fried foods. Focus on lean protein sources like fish or chicken with vegetables and fruits. The American Heart Association is a Microbiologist!  American Heart Association Diet and Lifeystyle Recommendations   ? ?Exercise recommendations: ?The American Heart Association recommends 150 minutes of moderate intensity exercise weekly. ?Try 30 minutes of moderate intensity exercise 4-5 times per week. ?This could include walking, jogging, or swimming. ?Let us know if you want a referral to the PREP exercise program at the [facility]! ?

## 2021-04-09 ENCOUNTER — Telehealth (HOSPITAL_BASED_OUTPATIENT_CLINIC_OR_DEPARTMENT_OTHER): Payer: Self-pay

## 2021-04-09 LAB — COMPREHENSIVE METABOLIC PANEL
ALT: 21 IU/L (ref 0–44)
AST: 24 IU/L (ref 0–40)
Albumin/Globulin Ratio: 1.7 (ref 1.2–2.2)
Albumin: 4.5 g/dL (ref 3.8–4.9)
Alkaline Phosphatase: 90 IU/L (ref 44–121)
BUN/Creatinine Ratio: 15 (ref 9–20)
BUN: 13 mg/dL (ref 6–24)
Bilirubin Total: 0.4 mg/dL (ref 0.0–1.2)
CO2: 22 mmol/L (ref 20–29)
Calcium: 9.9 mg/dL (ref 8.7–10.2)
Chloride: 102 mmol/L (ref 96–106)
Creatinine, Ser: 0.86 mg/dL (ref 0.76–1.27)
Globulin, Total: 2.7 g/dL (ref 1.5–4.5)
Glucose: 206 mg/dL — ABNORMAL HIGH (ref 70–99)
Potassium: 4.8 mmol/L (ref 3.5–5.2)
Sodium: 138 mmol/L (ref 134–144)
Total Protein: 7.2 g/dL (ref 6.0–8.5)
eGFR: 103 mL/min/{1.73_m2} (ref >59)

## 2021-04-09 LAB — LIPID PANEL
Chol/HDL Ratio: 3.8 ratio (ref 0.0–5.0)
Cholesterol, Total: 105 mg/dL (ref 100–199)
HDL: 28 mg/dL — ABNORMAL LOW (ref >39)
LDL Chol Calc (NIH): 48 mg/dL (ref 0–99)
Triglycerides: 175 mg/dL — ABNORMAL HIGH (ref 0–149)
VLDL Cholesterol Cal: 29 mg/dL (ref 5–40)

## 2021-04-09 NOTE — Telephone Encounter (Addendum)
Results called to patient who verbalizes understanding!  ? ? ? ?----- Message from Loel Dubonnet, NP sent at 04/09/2021 12:22 PM EDT ----- ?Normal kidneys, liver, electrolytes. LDL (bad cholesterol) at goal of <70. Good result. Continue current medications. Triglycerides slightly elevated - continue to reduce intake of sweets, sugars, carbohydrates. ?

## 2021-04-16 ENCOUNTER — Other Ambulatory Visit: Payer: Self-pay | Admitting: Family Medicine

## 2021-04-16 ENCOUNTER — Other Ambulatory Visit: Payer: Self-pay | Admitting: Student

## 2021-04-16 ENCOUNTER — Telehealth: Payer: Self-pay | Admitting: Nurse Practitioner

## 2021-04-16 DIAGNOSIS — E1165 Type 2 diabetes mellitus with hyperglycemia: Secondary | ICD-10-CM

## 2021-04-16 DIAGNOSIS — M5032 Other cervical disc degeneration, mid-cervical region, unspecified level: Secondary | ICD-10-CM | POA: Diagnosis not present

## 2021-04-16 DIAGNOSIS — M9903 Segmental and somatic dysfunction of lumbar region: Secondary | ICD-10-CM | POA: Diagnosis not present

## 2021-04-16 DIAGNOSIS — M5386 Other specified dorsopathies, lumbar region: Secondary | ICD-10-CM | POA: Diagnosis not present

## 2021-04-16 DIAGNOSIS — M9901 Segmental and somatic dysfunction of cervical region: Secondary | ICD-10-CM | POA: Diagnosis not present

## 2021-04-16 NOTE — Telephone Encounter (Signed)
Patient said the ozempic sample is working well but the RX for it is $900.00. please advise ?

## 2021-04-17 MED ORDER — METFORMIN HCL 1000 MG PO TABS: 1000.0000 mg | ORAL_TABLET | Freq: Two times a day (BID) | ORAL | 3 refills | Status: DC

## 2021-04-17 MED ORDER — GLIPIZIDE 5 MG PO TABS: 5.0000 mg | ORAL_TABLET | Freq: Two times a day (BID) | ORAL | 3 refills | Status: DC

## 2021-04-17 NOTE — Telephone Encounter (Signed)
Patient does have a coupon. He will call his insurance about Trulicity.  ? ?He said he needs a new RX for his metformin and glipizide. [facility] [address] ?

## 2021-04-17 NOTE — Telephone Encounter (Signed)
Done, sent refill to his pharmacy

## 2021-04-17 NOTE — Telephone Encounter (Signed)
Left V/m

## 2021-04-17 NOTE — Telephone Encounter (Signed)
It depends on insurance and if he has met his deductible.  He can go on the [employer] and download a coupon that will help cover the cost significantly.

## 2021-04-22 NOTE — Telephone Encounter (Signed)
Patient left a Vm stating he has questions about Trulicity before he picks it up.  ?

## 2021-04-22 NOTE — Telephone Encounter (Signed)
Called the patient and he wanted to let us know that he has a few Ozempic left and he will use this before he starts his Trulicity.  ?

## 2021-04-23 DIAGNOSIS — E119 Type 2 diabetes mellitus without complications: Secondary | ICD-10-CM | POA: Diagnosis not present

## 2021-04-23 DIAGNOSIS — S83241D Other tear of medial meniscus, current injury, right knee, subsequent encounter: Secondary | ICD-10-CM | POA: Diagnosis not present

## 2021-04-24 ENCOUNTER — Telehealth: Payer: Self-pay | Admitting: Nurse Practitioner

## 2021-04-24 NOTE — Telephone Encounter (Signed)
Pt called about his sugar staying in the 50s during the night, states he is on the [place] but does not know how to go back and pull the readings but knows it is alarming all night. Please advise ?

## 2021-04-24 NOTE — Telephone Encounter (Signed)
Called patient and left a detailed voice message to let him know that I am sending the link for his Freestyle Libre to his email again and to upload so we can see his readings or to call us back and give [place] the last 4 days of readings and I can help guide him on how to pull this up on his Freestyle [place] meter. ?

## 2021-05-13 ENCOUNTER — Other Ambulatory Visit: Payer: Self-pay | Admitting: Family Medicine

## 2021-05-14 NOTE — Telephone Encounter (Signed)
Requested medication (s) are due for refill today - yes ? ?Requested medication (s) are on the active medication list -yes ? ?Future visit scheduled -no ? ?Last refill: 05/14/20 #90 3RF ? ?Notes to clinic: Request RF: Attempted to call patient to verify change in provider- left message to call office, patient fails visit protocol and has not seen current PCP in office now-although Rx filled by provider. ? ?Requested Prescriptions  ?Pending Prescriptions Disp Refills  ? PARoxetine (PAXIL) 20 MG tablet [Pharmacy Med Name: PAROXETINE HCL 20 MG TABLET] 90 tablet 4  ?  Sig: TAKE 1 TABLET BY MOUTH EVERY DAY  ?  ? Psychiatry:  Antidepressants - SSRI Failed - 05/13/2021  2:34 PM  ?  ?  Failed - Completed PHQ-2 or PHQ-9 in the last 360 days  ?  ?  Failed - Valid encounter within last 6 months  ?  Recent Outpatient Visits   ? ?      ? 1 year ago Gastroesophageal reflux disease, unspecified whether esophagitis present  ? [place], Modena Nunnery, MD  ? 1 year ago Uncontrolled type 2 diabetes mellitus with hyperglycemia ([place])  ? [place], Matthias Hughs, FNP  ? 2 years ago Follow-up exam  ? [facility] Annie Main, FNP  ? 2 years ago Facial paralysis/Bells palsy  ? [place], [place], FNP  ? 2 years ago Insulin dependent type 2 diabetes mellitus, uncontrolled ([place])  ? [place], Matthias Hughs, FNP  ? ?  ?  ?Future Appointments   ? ?        ? In 4 months Loel Dubonnet, NP [facility]  ? ?  ? ? ?  ?  ?  ? ? ? ?Requested Prescriptions  ?Pending Prescriptions Disp Refills  ? PARoxetine (PAXIL) 20 MG tablet [Pharmacy Med Name: PAROXETINE HCL 20 MG TABLET] 90 tablet 4  ?  Sig: TAKE 1 TABLET BY MOUTH EVERY DAY  ?  ? Psychiatry:  Antidepressants - SSRI Failed - 05/13/2021  2:34 PM  ?  ?  Failed - Completed PHQ-2 or PHQ-9 in the last 360 days  ?  ?  Failed - Valid encounter within last 6 months  ?   Recent Outpatient Visits   ? ?      ? 1 year ago Gastroesophageal reflux disease, unspecified whether esophagitis present  ? [place], Modena Nunnery, MD  ? 1 year ago Uncontrolled type 2 diabetes mellitus with hyperglycemia ([place])  ? [place], Matthias Hughs, FNP  ? 2 years ago Follow-up exam  ? [facility] Annie Main, FNP  ? 2 years ago Facial paralysis/Bells palsy  ? [place], [place], FNP  ? 2 years ago Insulin dependent type 2 diabetes mellitus, uncontrolled ([place])  ? [place], Matthias Hughs, FNP  ? ?  ?  ?Future Appointments   ? ?        ? In 4 months Loel Dubonnet, NP [facility]  ? ?  ? ? ?  ?  ?  ? ? ? ?

## 2021-05-16 ENCOUNTER — Telehealth: Payer: Self-pay | Admitting: Nurse Practitioner

## 2021-05-16 NOTE — Telephone Encounter (Signed)
Received surgical clearance from Raliegh Ip, patient needs to come in for a follow up with readings to see Whitney as soon as he can. Tried to contact patient, no answer. Left message to call back ?

## 2021-05-17 ENCOUNTER — Other Ambulatory Visit: Payer: Self-pay | Admitting: Family Medicine

## 2021-05-19 NOTE — Telephone Encounter (Signed)
Refused by [id number], Duplicate request ?Requested Prescriptions  ?Pending Prescriptions Disp Refills  ?? PARoxetine (PAXIL) 20 MG tablet [Pharmacy Med Name: PAROXETINE HCL 20 MG TABLET] 30 tablet 11  ?  Sig: TAKE 1 TABLET BY MOUTH EVERY DAY  ?  ? Psychiatry:  Antidepressants - SSRI Failed - 05/17/2021  1:29 AM  ?  ?  Failed - Completed PHQ-2 or PHQ-9 in the last 360 days  ?  ?  Failed - Valid encounter within last 6 months  ?  Recent Outpatient Visits   ?      ? 1 year ago Gastroesophageal reflux disease, unspecified whether esophagitis present  ? [place], Modena Nunnery, MD  ? 1 year ago Uncontrolled type 2 diabetes mellitus with hyperglycemia ([place])  ? [place], Matthias Hughs, FNP  ? 2 years ago Follow-up exam  ? [facility] Annie Main, FNP  ? 2 years ago Facial paralysis/Bells palsy  ? [place], [place], FNP  ? 2 years ago Insulin dependent type 2 diabetes mellitus, uncontrolled ([place])  ? [place], Matthias Hughs, FNP  ?  ?  ?Future Appointments   ?        ? In 4 months Loel Dubonnet, NP [facility]  ?  ? ?  ?  ?  ? ?

## 2021-05-21 DIAGNOSIS — M5032 Other cervical disc degeneration, mid-cervical region, unspecified level: Secondary | ICD-10-CM | POA: Diagnosis not present

## 2021-05-21 DIAGNOSIS — M9901 Segmental and somatic dysfunction of cervical region: Secondary | ICD-10-CM | POA: Diagnosis not present

## 2021-05-21 DIAGNOSIS — M9903 Segmental and somatic dysfunction of lumbar region: Secondary | ICD-10-CM | POA: Diagnosis not present

## 2021-05-21 DIAGNOSIS — M5386 Other specified dorsopathies, lumbar region: Secondary | ICD-10-CM | POA: Diagnosis not present

## 2021-05-22 ENCOUNTER — Telehealth: Payer: Self-pay | Admitting: Nurse Practitioner

## 2021-05-22 MED ORDER — INSULIN DETEMIR 100 UNIT/ML FLEXPEN: 50.0000 [IU] | PEN_INJECTOR | Freq: Every day | SUBCUTANEOUS | 2 refills | Status: DC

## 2021-05-22 NOTE — Telephone Encounter (Signed)
Patient asked for a refill on his [place]. [facility] [address] ?

## 2021-05-22 NOTE — Telephone Encounter (Signed)
Rx sent 

## 2021-05-26 ENCOUNTER — Ambulatory Visit (HOSPITAL_BASED_OUTPATIENT_CLINIC_OR_DEPARTMENT_OTHER): Admission: RE | Admit: 2021-05-26 | Payer: BC Managed Care – PPO | Source: Ambulatory Visit

## 2021-05-27 ENCOUNTER — Other Ambulatory Visit (HOSPITAL_BASED_OUTPATIENT_CLINIC_OR_DEPARTMENT_OTHER): Payer: Self-pay

## 2021-05-27 DIAGNOSIS — Z01812 Encounter for preprocedural laboratory examination: Secondary | ICD-10-CM

## 2021-05-28 DIAGNOSIS — E291 Testicular hypofunction: Secondary | ICD-10-CM | POA: Diagnosis not present

## 2021-05-28 DIAGNOSIS — E119 Type 2 diabetes mellitus without complications: Secondary | ICD-10-CM | POA: Diagnosis not present

## 2021-05-28 DIAGNOSIS — I10 Essential (primary) hypertension: Secondary | ICD-10-CM | POA: Diagnosis not present

## 2021-05-28 DIAGNOSIS — E559 Vitamin D deficiency, unspecified: Secondary | ICD-10-CM | POA: Diagnosis not present

## 2021-05-28 DIAGNOSIS — Z794 Long term (current) use of insulin: Secondary | ICD-10-CM | POA: Diagnosis not present

## 2021-05-28 DIAGNOSIS — Z6834 Body mass index (BMI) 34.0-34.9, adult: Secondary | ICD-10-CM | POA: Diagnosis not present

## 2021-05-28 DIAGNOSIS — Z79899 Other long term (current) drug therapy: Secondary | ICD-10-CM | POA: Diagnosis not present

## 2021-05-28 DIAGNOSIS — E7800 Pure hypercholesterolemia, unspecified: Secondary | ICD-10-CM | POA: Diagnosis not present

## 2021-05-29 DIAGNOSIS — M5386 Other specified dorsopathies, lumbar region: Secondary | ICD-10-CM | POA: Diagnosis not present

## 2021-05-29 DIAGNOSIS — M9903 Segmental and somatic dysfunction of lumbar region: Secondary | ICD-10-CM | POA: Diagnosis not present

## 2021-05-29 DIAGNOSIS — M5032 Other cervical disc degeneration, mid-cervical region, unspecified level: Secondary | ICD-10-CM | POA: Diagnosis not present

## 2021-05-29 DIAGNOSIS — M9901 Segmental and somatic dysfunction of cervical region: Secondary | ICD-10-CM | POA: Diagnosis not present

## 2021-06-02 ENCOUNTER — Telehealth (HOSPITAL_BASED_OUTPATIENT_CLINIC_OR_DEPARTMENT_OTHER): Payer: Self-pay | Admitting: Cardiology

## 2021-06-02 NOTE — Telephone Encounter (Signed)
Spoke with patient regarding Thursday 06/05/21 1:00pm CTA chest/aorta appointment here at the [place] location-----arrival time is 12:45 pm for check in ---Liquids only 4 hours prior to study----Patient to come in this afternoon or tomorrow for lab work-----patient voiced his understanding.

## 2021-06-04 DIAGNOSIS — D508 Other iron deficiency anemias: Secondary | ICD-10-CM | POA: Diagnosis not present

## 2021-06-04 DIAGNOSIS — E119 Type 2 diabetes mellitus without complications: Secondary | ICD-10-CM | POA: Diagnosis not present

## 2021-06-04 DIAGNOSIS — E7800 Pure hypercholesterolemia, unspecified: Secondary | ICD-10-CM | POA: Diagnosis not present

## 2021-06-04 DIAGNOSIS — Z0130 Encounter for examination of blood pressure without abnormal findings: Secondary | ICD-10-CM | POA: Diagnosis not present

## 2021-06-04 DIAGNOSIS — I10 Essential (primary) hypertension: Secondary | ICD-10-CM | POA: Diagnosis not present

## 2021-06-04 DIAGNOSIS — Z6835 Body mass index (BMI) 35.0-35.9, adult: Secondary | ICD-10-CM | POA: Diagnosis not present

## 2021-06-05 ENCOUNTER — Telehealth: Payer: Self-pay | Admitting: Nurse Practitioner

## 2021-06-05 ENCOUNTER — Ambulatory Visit (HOSPITAL_BASED_OUTPATIENT_CLINIC_OR_DEPARTMENT_OTHER): Payer: BC Managed Care – PPO

## 2021-06-05 NOTE — Telephone Encounter (Signed)
Discussed with pt, understanding voiced. He stated he will continue with the original regimen and the increase of ozempic. Pt has appointment on the 19th of June and did not feel at this time he needed anything sooner.

## 2021-06-05 NOTE — Telephone Encounter (Signed)
Patient said that his PCP is wanting him to take 27m of Ozempic. He said he did 0.5 mg for 2 months. He did handle that well, he said he does feel like he should increase it but his PCP said he is on too much medication. Please advise on what he should do. He did get a RX called in from his PCP of ozempic 1 mg and it was free, he thinks he met his deductible. She told him to stop taking metformin and start taking FIran He has not done anything except he took his Ozempic. Also, his PCP said that his levemir told him to take 20 units in the morning instead of 50 at night. She told him to stop his Novolin. PCP is aware he is seeing you.

## 2021-06-05 NOTE — Telephone Encounter (Signed)
Goodness!  I think he is fine to increase the Ozempic to 1 mg weekly if he tolerated the 0.5 mg dose ok.  I do not like [place] due to the negative side effects it has (frequent urination, UTIs, and yeast infections in the groin).  I prefer him not to be on that medication unless he has significant problems with his kidneys or congestive heart failure (which he does not).  4 injections of insulin per day is what is going to help bring his readings down so he can have his knee surgery.  Advise him NOT to make any other changed besides increasing his Ozempic to 1 mg weekly.  We can do a follow up sooner if we need to, to discuss readings and potential med changes.

## 2021-06-10 ENCOUNTER — Other Ambulatory Visit: Payer: Self-pay | Admitting: Cardiology

## 2021-06-10 DIAGNOSIS — Z01812 Encounter for preprocedural laboratory examination: Secondary | ICD-10-CM | POA: Diagnosis not present

## 2021-06-11 ENCOUNTER — Encounter (HOSPITAL_BASED_OUTPATIENT_CLINIC_OR_DEPARTMENT_OTHER): Payer: Self-pay

## 2021-06-11 ENCOUNTER — Ambulatory Visit (HOSPITAL_BASED_OUTPATIENT_CLINIC_OR_DEPARTMENT_OTHER)
Admission: RE | Admit: 2021-06-11 | Discharge: 2021-06-11 | Disposition: A | Discharge status: Home / Self Care | Payer: BC Managed Care – PPO | Source: Ambulatory Visit | Attending: Family | Admitting: Family

## 2021-06-11 DIAGNOSIS — I77810 Thoracic aortic ectasia: Secondary | ICD-10-CM | POA: Diagnosis not present

## 2021-06-11 DIAGNOSIS — I25118 Atherosclerotic heart disease of native coronary artery with other forms of angina pectoris: Secondary | ICD-10-CM | POA: Insufficient documentation

## 2021-06-11 DIAGNOSIS — I10 Essential (primary) hypertension: Secondary | ICD-10-CM | POA: Insufficient documentation

## 2021-06-11 DIAGNOSIS — K449 Diaphragmatic hernia without obstruction or gangrene: Secondary | ICD-10-CM | POA: Diagnosis not present

## 2021-06-11 LAB — BASIC METABOLIC PANEL
BUN/Creatinine Ratio: 20 (ref 9–20)
BUN: 17 mg/dL (ref 6–24)
CO2: 19 mmol/L — ABNORMAL LOW (ref 20–29)
Calcium: 9.3 mg/dL (ref 8.7–10.2)
Chloride: 102 mmol/L (ref 96–106)
Creatinine, Ser: 0.84 mg/dL (ref 0.76–1.27)
Glucose: 234 mg/dL — ABNORMAL HIGH (ref 70–99)
Potassium: 4.6 mmol/L (ref 3.5–5.2)
Sodium: 138 mmol/L (ref 134–144)
eGFR: 104 mL/min/{1.73_m2} (ref >59)

## 2021-06-11 LAB — POCT I-STAT CREATININE: Creatinine, Ser: 0.8 mg/dL (ref 0.61–1.24)

## 2021-06-11 MED ORDER — IOHEXOL 350 MG/ML SOLN
100.0000 mL | Freq: Once | INTRAVENOUS | Status: AC | PRN
  Administered 2021-06-11: 75 mL via INTRAVENOUS

## 2021-06-13 ENCOUNTER — Telehealth (HOSPITAL_BASED_OUTPATIENT_CLINIC_OR_DEPARTMENT_OTHER): Payer: Self-pay

## 2021-06-13 NOTE — Telephone Encounter (Addendum)
Results called to patient who verbalizes understanding!      ----- Message from Loel Dubonnet, NP sent at 06/12/2021  1:00 PM EDT ----- CT reveals hepatic steatosis  (fatty liver) likely due to diabetes. Continue to avoid fried foods, manage blood sugar. Small hiatal hernia not of concern. No thoracic aneurysm. Overall great result!

## 2021-06-16 DIAGNOSIS — M9903 Segmental and somatic dysfunction of lumbar region: Secondary | ICD-10-CM | POA: Diagnosis not present

## 2021-06-16 DIAGNOSIS — M5032 Other cervical disc degeneration, mid-cervical region, unspecified level: Secondary | ICD-10-CM | POA: Diagnosis not present

## 2021-06-16 DIAGNOSIS — M9901 Segmental and somatic dysfunction of cervical region: Secondary | ICD-10-CM | POA: Diagnosis not present

## 2021-06-16 DIAGNOSIS — M5386 Other specified dorsopathies, lumbar region: Secondary | ICD-10-CM | POA: Diagnosis not present

## 2021-06-30 ENCOUNTER — Ambulatory Visit: Payer: BC Managed Care – PPO | Admitting: Nurse Practitioner

## 2021-06-30 ENCOUNTER — Telehealth: Payer: Self-pay | Admitting: Nurse Practitioner

## 2021-06-30 NOTE — Telephone Encounter (Signed)
Patient called and said he is getting very sick with taking ozempic and would like a call back to discuss.

## 2021-06-30 NOTE — Telephone Encounter (Signed)
Pt has been on the '1mg'$  for about 3 weeks and states that he has had an upset stomach and vomiting most days since then.

## 2021-07-01 NOTE — Telephone Encounter (Signed)
Pt states that he will try to stop the Metformin first. He will give it 1-2 weeks and let us know how he is doing.

## 2021-07-01 NOTE — Telephone Encounter (Signed)
So we have 2 options.  He could try stopping the Metformin since both the Metformin and Trulicity can cause upset stomach and the combo of the 2 may be too much for him (I had another patient with similar symptoms do well with the Trulicity after stopping the Metformin), or he can stop the Trulicity.

## 2021-07-07 DIAGNOSIS — M5032 Other cervical disc degeneration, mid-cervical region, unspecified level: Secondary | ICD-10-CM | POA: Diagnosis not present

## 2021-07-07 DIAGNOSIS — M5386 Other specified dorsopathies, lumbar region: Secondary | ICD-10-CM | POA: Diagnosis not present

## 2021-07-07 DIAGNOSIS — M9901 Segmental and somatic dysfunction of cervical region: Secondary | ICD-10-CM | POA: Diagnosis not present

## 2021-07-07 DIAGNOSIS — M9903 Segmental and somatic dysfunction of lumbar region: Secondary | ICD-10-CM | POA: Diagnosis not present

## 2021-07-16 ENCOUNTER — Ambulatory Visit (INDEPENDENT_AMBULATORY_CARE_PROVIDER_SITE_OTHER): Payer: BC Managed Care – PPO | Admitting: Nurse Practitioner

## 2021-07-16 ENCOUNTER — Encounter: Payer: Self-pay | Admitting: Nurse Practitioner

## 2021-07-16 VITALS — BP 130/88 | HR 99 | Ht 70.0 in | Wt 242.0 lb

## 2021-07-16 DIAGNOSIS — E1165 Type 2 diabetes mellitus with hyperglycemia: Secondary | ICD-10-CM | POA: Diagnosis not present

## 2021-07-16 DIAGNOSIS — Z794 Long term (current) use of insulin: Secondary | ICD-10-CM

## 2021-07-16 LAB — POCT GLYCOSYLATED HEMOGLOBIN (HGB A1C): HbA1c POC (<> result, manual entry): 9 % (ref 4.0–5.6)

## 2021-07-16 MED ORDER — SEMAGLUTIDE (2 MG/DOSE) 8 MG/3ML ~~LOC~~ SOPN: 2.0000 mg | PEN_INJECTOR | SUBCUTANEOUS | 3 refills | Status: DC

## 2021-07-16 NOTE — Progress Notes (Signed)
Endocrinology Follow Up Note       07/16/2021, 9:43 AM   Subjective:    Patient ID: Chad Haynes, male    DOB: May 20, 1965.  Chad Haynes is being seen in follow up after being seen in consultation for management of currently uncontrolled symptomatic diabetes requested by  Center, [facility].   Past Medical History:  Diagnosis Date   Depression    Diabetes mellitus without complication ([place])    Diabetes mellitus, type II ([place])    GERD (gastroesophageal reflux disease)    H. pylori infection    HTN (hypertension)    Hyperlipidemia    Hypogonadism male    Leg pain    Low magnesium levels    Neck pain    Rhinitis, allergic    Vitamin B12 deficiency    Vitamin D deficiency    White coat hypertension     Past Surgical History:  Procedure Laterality Date   KNEE ARTHROSCOPY Right     Social History   Socioeconomic History   Marital status: Married    Spouse name: Not on file   Number of children: Not on file   Years of education: Not on file   Highest education level: Not on file  Occupational History   Not on file  Tobacco Use   Smoking status: Former    Types: Cigarettes    Quit date: 06/08/1991    Years since quitting: 30.1   Smokeless tobacco: Never  Vaping Use   Vaping Use: Never used  Substance and Sexual Activity   Alcohol use: Yes    Alcohol/week: 0.0 standard drinks of alcohol    Comment: occasionally   Drug use: No   Sexual activity: Yes  Other Topics Concern   Not on file  Social History Narrative   Married. No children. Does not formal exercise.   In school at [employer] full-time. Works part-time job at night.   "Has to walk a mile from the car to classic [employer]"   Social Determinants of Health   Financial Resource Strain: Not on file  Food Insecurity: Not on file  Transportation Needs: Not on file  Physical Activity: Not on file  Stress: Not on file  Social  Connections: Not on file    Family History  Problem Relation Age of Onset   COPD Mother        smoker   Hypertension Father     Outpatient Encounter Medications as of 07/16/2021  Medication Sig   Semaglutide, 2 MG/DOSE, 8 MG/3ML SOPN Inject 2 mg as directed once a week.   BD PEN NEEDLE NANO 2ND GEN 32G X 4 MM MISC USE AS DIRECTED WITH INSULIN   Continuous Blood Gluc Sensor (FREESTYLE LIBRE 14 DAY SENSOR) MISC Use as directed to monitor glucose continuously. Change sensor Q 14 days. Dx: E11.65.   glipiZIDE (GLUCOTROL) 5 MG tablet Take 1 tablet (5 mg total) by mouth 2 (two) times daily before a meal. TAKE 1 TABLET BY MOUTH TWICE A DAY BEFORE A MEAL/PT NEEDS OV FOR FURTHER REFILLS   insulin detemir (LEVEMIR) 100 UNIT/ML FlexPen Inject 50 Units into the skin daily.   insulin regular (NOVOLIN  R) 100 units/mL injection Inject 0.1-0.16 mLs (10-16 Units total) into the skin 3 (three) times daily before meals.   Insulin Syringe-Needle U-100 (BD INSULIN SYRINGE U/F) 31G X 5/16" 0.3 ML MISC USE WITH SLIDING SCALE INSULIN   lisinopril (ZESTRIL) 10 MG tablet Take 1 tablet (10 mg total) by mouth daily.   metFORMIN (GLUCOPHAGE) 1000 MG tablet Take 1 tablet (1,000 mg total) by mouth 2 (two) times daily with a meal. (Patient not taking: Reported on 07/16/2021)   metoprolol succinate (TOPROL XL) 25 MG 24 hr tablet Take 1 tablet (25 mg total) by mouth daily.   omeprazole (PRILOSEC OTC) 20 MG tablet Take 20 mg by mouth daily.   PARoxetine (PAXIL) 20 MG tablet Take 1 tablet (20 mg total) by mouth daily.   QUICKVUE AT-HOME COVID-19 TEST KIT See admin instructions.   rosuvastatin (CRESTOR) 40 MG tablet Take 1 tablet (40 mg total) by mouth daily.   [DISCONTINUED] Semaglutide,0.25 or 0.5MG/DOS, (OZEMPIC, 0.25 OR 0.5 MG/DOSE,) 2 MG/1.5ML SOPN Inject 0.5 mg into the skin once a week. (Patient taking differently: Inject 1 mg into the skin once a week.)   No facility-administered encounter medications on file as of  07/16/2021.    ALLERGIES: No Known Allergies  VACCINATION STATUS: Immunization History  Administered Date(s) Administered   PFIZER(Purple Top)SARS-COV-2 Vaccination 04/13/2019, 05/13/2019, 12/17/2019   Pneumococcal Polysaccharide-23 01/17/2014   Tdap 05/07/2016    Diabetes He presents for his follow-up diabetic visit. He has type 2 diabetes mellitus. Onset time: was diagnosed at approx age of 18. His disease course has been improving. Hypoglycemia symptoms include sweats and tremors. Associated symptoms include blurred vision, fatigue, polydipsia and polyuria. Hypoglycemia complications include nocturnal hypoglycemia. Symptoms are improving. Diabetic complications include impotence. 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 Current diabetic treatment includes intensive insulin program and oral agent (monotherapy) (and Ozempic). He is compliant with treatment most of the time (skips his Novolin R usually). His weight is fluctuating minimally. He is following a generally unhealthy diet. When asked about meal planning, he reported none. He has had a previous visit with a dietitian (Did not find it beneficial). He rarely participates in exercise. His home blood glucose trend is fluctuating minimally. His overall blood glucose range is >200 mg/dl. (He presents today with his CGM showing limited data and no logs.  He usually forgets to take his Novolin R with him during the day.  His POCT A1c today is 9%, improving from last visit of 11.3%.  He is tolerating the Ozempic now that the Metformin was stopped.  Analysis of his CGM shows TIR 25%, TAR 75%, TBR 0%.) An ACE inhibitor/angiotensin II receptor blocker is being taken. He does not see a podiatrist.Eye exam is current.  Hyperlipidemia This is a  chronic problem. The current episode started more than 1 year ago. The problem is controlled. Recent lipid tests were reviewed and are normal. Exacerbating diseases include diabetes and obesity. Factors aggravating his hyperlipidemia include fatty foods. Current antihyperlipidemic treatment includes statins. The current treatment provides moderate improvement of lipids. Compliance problems include adherence to diet and adherence to exercise.  Risk factors for coronary artery disease include diabetes mellitus, dyslipidemia, hypertension, male sex, obesity and a sedentary lifestyle.  Hypertension This is a chronic problem. The current episode started more than 1 year ago. The problem has been gradually improving since  onset. The problem is controlled. Associated symptoms include blurred vision and sweats. There are no associated agents to hypertension. Risk factors for coronary artery disease include diabetes mellitus, dyslipidemia, family history, male gender, obesity and sedentary lifestyle. Past treatments include ACE inhibitors. The current treatment provides mild improvement. Compliance problems include diet and exercise.      Review of systems  Constitutional: + Minimally fluctuating body weight,  current Body mass index is 34.72 kg/m. , + fatigue, no subjective hyperthermia, no subjective hypothermia Eyes: + blurry vision, no xerophthalmia ENT: no sore throat, no nodules palpated in throat, no dysphagia/odynophagia, no hoarseness Cardiovascular: no chest pain, no shortness of breath, no palpitations, no leg swelling Respiratory: no cough, no shortness of breath Gastrointestinal: no nausea/vomiting/diarrhea Musculoskeletal: right knee pain- awaiting clearance for surgery Skin: no rashes, no hyperemia Neurological: no tremors, no numbness, no tingling, no dizziness Psychiatric: no depression, no anxiety  Objective:     BP 130/88   Pulse 99   Ht '5\' 10"'  (1.778 m)   Wt 242 lb (109.8 kg)    BMI 34.72 kg/m   Wt Readings from Last 3 Encounters:  07/16/21 242 lb (109.8 kg)  04/08/21 244 lb 6.4 oz (110.9 kg)  03/27/21 243 lb (110.2 kg)     BP Readings from Last 3 Encounters:  07/16/21 130/88  04/08/21 128/68  03/27/21 115/70      Physical Exam- Limited  Constitutional:  Body mass index is 34.72 kg/m. , not in acute distress, normal state of mind Eyes:  EOMI, no exophthalmos Neck: Supple Cardiovascular: RRR, no murmurs, rubs, or gallops, no edema Respiratory: Adequate breathing efforts, no crackles, rales, rhonchi, or wheezing Musculoskeletal: no gross deformities, strength intact in all four extremities, no gross restriction of joint movements Skin:  no rashes, no hyperemia Neurological: no tremor with outstretched hands     CMP ( most recent) CMP     Component Value Date/Time   NA 138 06/10/2021 1037   K 4.6 06/10/2021 1037   CL 102 06/10/2021 1037   CO2 19 (L) 06/10/2021 1037   GLUCOSE 234 (H) 06/10/2021 1037   GLUCOSE 243 (H) 03/25/2021 0111   BUN 17 06/10/2021 1037   CREATININE 0.80 06/11/2021 0725   CREATININE 0.81 03/15/2020 1159   CALCIUM 9.3 06/10/2021 1037   PROT 7.2 04/08/2021 0846   ALBUMIN 4.5 04/08/2021 0846   AST 24 04/08/2021 0846   ALT 21 04/08/2021 0846   ALKPHOS 90 04/08/2021 0846   BILITOT 0.4 04/08/2021 0846   GFRNONAA >60 03/25/2021 0111   GFRNONAA 75 02/27/2019 1602   GFRAA >60 02/28/2019 0927   GFRAA 87 02/27/2019 1602     Diabetic Labs (most recent): Lab Results  Component Value Date   HGBA1C 9.0 07/16/2021   HGBA1C 11.3 (H) 03/24/2021   HGBA1C 12.8 02/03/2021   MICROALBUR 80 02/25/2021   MICROALBUR 0.5 07/13/2019   MICROALBUR 0.5 03/10/2018     Lipid Panel ( most recent) Lipid Panel     Component Value Date/Time   CHOL 105 04/08/2021 0846   TRIG 175 (H) 04/08/2021 0846   HDL 28 (L) 04/08/2021 0846   CHOLHDL 3.8 04/08/2021 0846   CHOLHDL 7.6 (H) 02/27/2019 1602   VLDL 38 (H) 05/07/2016 0940   LDLCALC 48  04/08/2021 0846   LDLCALC  02/27/2019 1602     Comment:     . LDL cholesterol not calculated. Triglyceride levels greater than 400 mg/dL invalidate calculated LDL results. . Reference range: <100 . Desirable range <  100 mg/dL for primary prevention;   <70 mg/dL for patients with CHD or diabetic patients  with > or = 2 CHD risk factors. Marland Kitchen LDL-C is now calculated using the Martin-Hopkins  calculation, which is a validated novel method providing  better accuracy than the Friedewald equation in the  estimation of LDL-C.  Cresenciano Genre et al. Annamaria Helling. 1572;620(35): 2061-2068  (http://education.Quest[employer]ostics.com/faq/FAQ164)    LDLDIRECT 68 03/15/2020 1159   LABVLDL 29 04/08/2021 0846      Lab Results  Component Value Date   TSH 0.92 03/10/2018   TSH 0.69 05/07/2016           Assessment & Plan:   1) Uncontrolled type 2 diabetes mellitus with hyperglycemia (HCC)  - Chad Haynes has currently uncontrolled symptomatic type 2 DM since 56 years of age.Marland Kitchen   He presents today with his CGM showing limited data and no logs.  He usually forgets to take his Novolin R with him during the day.  His POCT A1c today is 9%, improving from last visit of 11.3%.  He is tolerating the Ozempic now that the Metformin was stopped.  Analysis of his CGM shows TIR 25%, TAR 75%, TBR 0%.  -Recent labs reviewed.   - I had a long discussion with him about the progressive nature of diabetes and the pathology behind its complications. -his diabetes is not currently complicated but he remains at a high risk for more acute and chronic complications which include CAD, CVA, CKD, retinopathy, and neuropathy. These are all discussed in detail with him.  - Nutritional counseling repeated at each appointment due to patients tendency to fall back in to old habits.  - The patient admits there is a room for improvement in their diet and drink choices. -  Suggestion is made for the patient to avoid simple  carbohydrates from their diet including Cakes, Sweet Desserts / Pastries, Ice Cream, Soda (diet and regular), Sweet Tea, Candies, Chips, Cookies, Sweet Pastries, Store Bought Juices, Alcohol in Excess of 1-2 drinks a day, Artificial Sweeteners, Coffee Creamer, and "Sugar-free" Products. This will help patient to have stable blood glucose profile and potentially avoid unintended weight gain.   - I encouraged the patient to switch to unprocessed or minimally processed complex starch and increased protein intake (animal or plant source), fruits, and vegetables.   - Patient is advised to stick to a routine mealtimes to eat 3 meals a day and avoid unnecessary snacks (to snack only to correct hypoglycemia).  - I have approached him with the following individualized plan to manage  his diabetes and patient agrees:   -He is advised to continue Levemir 50 units with breakfast and Novolin R 10-16 units TID with meals if glucose is above 90 and he is eating (Specific instructions on how to titrate insulin dosage based on glucose readings given to patient in writing).  He says he will do better to remember to take his medications with him so he can inject more frequently.  He can increase his Ozempic to 2 mg SQ weekly (may take 2 of his 1 mg injections until he depletes current supply).  He can also continue his Glipizide 5 mg po twice daily with meals for now.    -he is encouraged to use his CGM to continue monitoring blood glucose 4 times daily-top be more consistent with scanning, before meals and before bed, and to call the clinic if he has readings less than 70 or greater than 300 for 3 tests  in a row.  Does not appear that his insurance will cover [place] [facility].  - he is warned not to take insulin without proper monitoring per orders. - Adjustment parameters are given to him for hypo and hyperglycemia in writing.  - he will be considered for incretin therapy as appropriate next visit.  - Specific targets for   A1c;  LDL, HDL,  and Triglycerides were discussed with the patient.  2) Blood Pressure /Hypertension:  his blood pressure is controlled to target. There is documentation of white coat syndrome.  he is advised to continue his current medications including Lisinopril 10 mg p.o. daily with breakfast.  3) Lipids/Hyperlipidemia:    Review of his recent lipid panel from 04/08/21 showed controlled LDL at 48 .  he  is advised to continue Simvastatin 80 mg daily at bedtime.  Side effects and precautions discussed with him.    4)  Weight/Diet:  his Body mass index is 34.72 kg/m.  -  clearly complicating his diabetes care.   he is candidate for weight loss. I discussed with him the fact that loss of 5 - 10% of his  current body weight will have the most impact on his diabetes management.  Exercise, and detailed carbohydrates information provided  -  detailed on discharge instructions.  5) Chronic Care/Health Maintenance: -he is on ACEI/ARB and Statin medications and is encouraged to initiate and continue to follow up with Ophthalmology, Dentist, Podiatrist at least yearly or according to recommendations, and advised to stay away from smoking. I have recommended yearly flu vaccine and pneumonia vaccine at least every 5 years; moderate intensity exercise for up to 150 minutes weekly; and sleep for at least 7 hours a day.  - he is advised to maintain close follow up with Center, [facility] for primary care needs, as well as his other providers for optimal and coordinated care.     I spent 41 minutes in the care of the patient today including review of labs from [place], Lipids, Thyroid Function, Hematology (current and previous including abstractions from other facilities); face-to-face time discussing  his blood glucose readings/logs, discussing hypoglycemia and hyperglycemia episodes and symptoms, medications doses, his options of short and long term treatment based on the latest standards of care /  guidelines;  discussion about incorporating lifestyle medicine;  and documenting the encounter. Risk reduction counseling performed per USPSTF guidelines to reduce obesity and cardiovascular risk factors.     Please refer to Patient Instructions for Blood Glucose Monitoring and Insulin/Medications Dosing Guide"  in media tab for additional information. Please  also refer to " Patient Self Inventory" in the Media  tab for reviewed elements of pertinent patient history.  Chad Haynes participated in the discussions, expressed understanding, and voiced agreement with the above plans.  All questions were answered to his satisfaction. he is encouraged to contact clinic should he have any questions or concerns prior to his return visit.   Follow up plan: - Return in about 3 months (around 10/16/2021) for Diabetes F/U with A1c in office, No previsit labs, Bring meter and logs.  Rayetta Pigg, [facility] [address] [place], [place] [address] Phone: [phone] Fax: [phone]  07/16/2021, 9:43 AM

## 2021-07-16 NOTE — Patient Instructions (Signed)
Diabetes Mellitus and Foot Care Foot care is an important part of your health, especially when you have diabetes. Diabetes may cause you to have problems because of poor blood flow (circulation) to your feet and legs, which can cause your skin to: Become thinner and drier. Break more easily. Heal more slowly. Peel and crack. You may also have nerve damage (neuropathy) in your legs and feet, causing decreased feeling in them. This means that you may not notice minor injuries to your feet that could lead to more serious problems. Noticing and addressing any potential problems early is the best way to prevent future foot problems. How to care for your feet Foot hygiene  Wash your feet daily with warm water and mild soap. Do not use hot water. Then, pat your feet and the areas between your toes until they are completely dry. Do not soak your feet as this can dry your skin. Trim your toenails straight across. Do not dig under them or around the cuticle. File the edges of your nails with an emery board or nail file. Apply a moisturizing lotion or petroleum jelly to the skin on your feet and to dry, brittle toenails. Use lotion that does not contain alcohol and is unscented. Do not apply lotion between your toes. Shoes and socks Wear clean socks or stockings every day. Make sure they are not too tight. Do not wear knee-high stockings since they may decrease blood flow to your legs. Wear shoes that fit properly and have enough cushioning. Always look in your shoes before you put them on to be sure there are no objects inside. To break in new shoes, wear them for just a few hours a day. This prevents injuries on your feet. Wounds, scrapes, corns, and calluses  Check your feet daily for blisters, cuts, bruises, sores, and redness. If you cannot see the bottom of your feet, use a mirror or ask someone for help. Do not cut corns or calluses or try to remove them with medicine. If you find a minor scrape,  cut, or break in the skin on your feet, keep it and the skin around it clean and dry. You may clean these areas with mild soap and water. Do not clean the area with peroxide, alcohol, or iodine. If you have a wound, scrape, corn, or callus on your foot, look at it several times a day to make sure it is healing and not infected. Check for: Redness, swelling, or pain. Fluid or blood. Warmth. Pus or a bad smell. General tips Do not cross your legs. This may decrease blood flow to your feet. Do not use heating pads or hot water bottles on your feet. They may burn your skin. If you have lost feeling in your feet or legs, you may not know this is happening until it is too late. Protect your feet from hot and cold by wearing shoes, such as at the beach or on hot pavement. Schedule a complete foot exam at least once a year (annually) or more often if you have foot problems. Report any cuts, sores, or bruises to your health care provider immediately. Where to find more information American Diabetes Association: www.diabetes.org Association of Diabetes Care & Education Specialists: www.diabeteseducator.org Contact a health care provider if: You have a medical condition that increases your risk of infection and you have any cuts, sores, or bruises on your feet. You have an injury that is not healing. You have redness on your legs or feet. You   feel burning or tingling in your legs or feet. You have pain or cramps in your legs and feet. Your legs or feet are numb. Your feet always feel cold. You have pain around any toenails. Get help right away if: You have a wound, scrape, corn, or callus on your foot and: You have pain, swelling, or redness that gets worse. You have fluid or blood coming from the wound, scrape, corn, or callus. Your wound, scrape, corn, or callus feels warm to the touch. You have pus or a bad smell coming from the wound, scrape, corn, or callus. You have a fever. You have a red  line going up your leg. Summary Check your feet every day for blisters, cuts, bruises, sores, and redness. Apply a moisturizing lotion or petroleum jelly to the skin on your feet and to dry, brittle toenails. Wear shoes that fit properly and have enough cushioning. If you have foot problems, report any cuts, sores, or bruises to your health care provider immediately. Schedule a complete foot exam at least once a year (annually) or more often if you have foot problems. This information is not intended to replace advice given to you by your health care provider. Make sure you discuss any questions you have with your health care provider. Document Revised: 07/20/2019 Document Reviewed: 07/20/2019 Elsevier Patient Education  2023 [employer] Inc.  

## 2021-07-29 DIAGNOSIS — M9903 Segmental and somatic dysfunction of lumbar region: Secondary | ICD-10-CM | POA: Diagnosis not present

## 2021-07-29 DIAGNOSIS — M5032 Other cervical disc degeneration, mid-cervical region, unspecified level: Secondary | ICD-10-CM | POA: Diagnosis not present

## 2021-07-29 DIAGNOSIS — M5386 Other specified dorsopathies, lumbar region: Secondary | ICD-10-CM | POA: Diagnosis not present

## 2021-07-29 DIAGNOSIS — M9901 Segmental and somatic dysfunction of cervical region: Secondary | ICD-10-CM | POA: Diagnosis not present

## 2021-08-07 DIAGNOSIS — S83271A Complex tear of lateral meniscus, current injury, right knee, initial encounter: Secondary | ICD-10-CM | POA: Diagnosis not present

## 2021-08-07 DIAGNOSIS — S83241A Other tear of medial meniscus, current injury, right knee, initial encounter: Secondary | ICD-10-CM | POA: Diagnosis not present

## 2021-08-07 DIAGNOSIS — S83231A Complex tear of medial meniscus, current injury, right knee, initial encounter: Secondary | ICD-10-CM | POA: Diagnosis not present

## 2021-08-07 DIAGNOSIS — X58XXXA Exposure to other specified factors, initial encounter: Secondary | ICD-10-CM | POA: Diagnosis not present

## 2021-08-07 DIAGNOSIS — M1711 Unilateral primary osteoarthritis, right knee: Secondary | ICD-10-CM | POA: Diagnosis not present

## 2021-08-07 DIAGNOSIS — S83281A Other tear of lateral meniscus, current injury, right knee, initial encounter: Secondary | ICD-10-CM | POA: Diagnosis not present

## 2021-08-07 DIAGNOSIS — Y999 Unspecified external cause status: Secondary | ICD-10-CM | POA: Diagnosis not present

## 2021-08-07 DIAGNOSIS — G8918 Other acute postprocedural pain: Secondary | ICD-10-CM | POA: Diagnosis not present

## 2021-08-07 DIAGNOSIS — M2241 Chondromalacia patellae, right knee: Secondary | ICD-10-CM | POA: Diagnosis not present

## 2021-08-10 ENCOUNTER — Other Ambulatory Visit: Payer: Self-pay | Admitting: Student

## 2021-08-10 ENCOUNTER — Other Ambulatory Visit: Payer: Self-pay | Admitting: Family Medicine

## 2021-08-10 DIAGNOSIS — E1165 Type 2 diabetes mellitus with hyperglycemia: Secondary | ICD-10-CM

## 2021-08-12 DIAGNOSIS — M5386 Other specified dorsopathies, lumbar region: Secondary | ICD-10-CM | POA: Diagnosis not present

## 2021-08-12 DIAGNOSIS — M5032 Other cervical disc degeneration, mid-cervical region, unspecified level: Secondary | ICD-10-CM | POA: Diagnosis not present

## 2021-08-12 DIAGNOSIS — M9901 Segmental and somatic dysfunction of cervical region: Secondary | ICD-10-CM | POA: Diagnosis not present

## 2021-08-12 DIAGNOSIS — M9903 Segmental and somatic dysfunction of lumbar region: Secondary | ICD-10-CM | POA: Diagnosis not present

## 2021-08-12 NOTE — Telephone Encounter (Signed)
Requested medication (s) are due for refill today - expired Rx  Requested medication (s) are on the active medication list -yes  Future visit scheduled -no  Last refill: Paroxetine- 05/14/20 #90 3RF- expired Rx                 Novolin R- 03/27/21 18m 11RF- outside provider  Notes to clinic: see above- patient my not be current patient of practice  Requested Prescriptions  Pending Prescriptions Disp Refills   PARoxetine (PAXIL) 20 MG tablet [Pharmacy Med Name: PAROXETINE HCL 20 MG TABLET] 30 tablet 11    Sig: TAKE 1 [place]    Psychiatry:  Antidepressants - SSRI Failed - 08/10/2021  3:06 PM      Failed - Completed PHQ-2 or PHQ-9 in the last 360 days      Failed - Valid encounter within last 6 months    Recent Outpatient Visits           1 year ago Gastroesophageal reflux disease, unspecified whether esophagitis present   [place] KModena Nunnery MD   2 years ago Uncontrolled type 2 diabetes mellitus with hyperglycemia ([place]   B[place] CWagner FNP   2 years ago Follow-up exam   BW[place] CPlainwell FNP   2 years ago Facial paralysis/Bells palsy   BW[place] CCastle Shannon FNP   2 years ago Insulin dependent type 2 diabetes mellitus, uncontrolled ([place]   BColome CMatthias Hughs FKay      Future Appointments             In 1 month WGilford Rile CMartie Lee NP [facility]             NOVOLIN R 100 UNIT/ML injection [Pharmacy Med Name: NOVOLIN R 100 UNIT/ML VIAL] 10 mL 11    Sig: PLEASE SEE AVerdonDIRECTIONS     Endocrinology:  Diabetes - Insulins Failed - 08/10/2021  3:06 PM      Failed - HBA1C is between 0 and 7.9 and within 180 days    Hemoglobin A1C  Date Value Ref Range Status  02/03/2021 12.8  Final    Comment:    [facility]on [facility]ordered from [facility]   HbA1c POC (<> result, manual entry)   Date Value Ref Range Status  07/16/2021 9.0 4.0 - 5.6 % Final         Failed - Valid encounter within last 6 months    Recent Outpatient Visits           1 year ago Gastroesophageal reflux disease, unspecified whether esophagitis present   [place] KModena Nunnery MD   2 years ago Uncontrolled type 2 diabetes mellitus with hyperglycemia ([place]   B[place]. Joseph CDexter FNP   2 years ago Follow-up exam   BM[place] CCharleston FNP   2 years ago Facial paralysis/Bells palsy   BPoint [place] CWest Amana FNP   2 years ago Insulin dependent type 2 diabetes mellitus, uncontrolled ([place]   BOber[place] CEast [place] FHamilton Square      Future Appointments             In 1 month WGilford Rile CMartie Lee NP [facility], [facility]  Requested Prescriptions  Pending Prescriptions Disp Refills   PARoxetine (PAXIL) 20 MG tablet [Pharmacy Med Name: PAROXETINE HCL 20 MG TABLET] 30 tablet 11    Sig: TAKE 1 TABLET BY MOUTH EVERY DAY     Psychiatry:  Antidepressants - SSRI Failed - 08/10/2021  3:06 PM      Failed - Completed PHQ-2 or PHQ-9 in the last 360 days      Failed - Valid encounter within last 6 months    Recent Outpatient Visits           1 year ago Gastroesophageal reflux disease, unspecified whether esophagitis present   [place], Modena Nunnery, MD   2 years ago Uncontrolled type 2 diabetes mellitus with hyperglycemia ([place])   [place], [place], FNP   2 years ago Follow-up exam   [place], [place], FNP   2 years ago Facial paralysis/Bells palsy   [place], Ashley, FNP   2 years ago Insulin dependent type 2 diabetes mellitus, uncontrolled ([place])   [place], Matthias Hughs, [place]       Future Appointments             In  1 month Gilford Rile, Martie Lee, NP [facility]             NOVOLIN R 100 UNIT/ML injection [Pharmacy Med Name: NOVOLIN R 100 UNIT/ML VIAL] 10 mL 11    Sig: PLEASE SEE [place] DIRECTIONS     Endocrinology:  Diabetes - Insulins Failed - 08/10/2021  3:06 PM      Failed - HBA1C is between 0 and 7.9 and within 180 days    Hemoglobin A1C  Date Value Ref Range Status  02/03/2021 12.8  Final    Comment:    Weston Anna ordered from [facility]   HbA1c POC (<> result, manual entry)  Date Value Ref Range Status  07/16/2021 9.0 4.0 - 5.6 % Final         Failed - Valid encounter within last 6 months    Recent Outpatient Visits           1 year ago Gastroesophageal reflux disease, unspecified whether esophagitis present   [place], Modena Nunnery, MD   2 years ago Uncontrolled type 2 diabetes mellitus with hyperglycemia ([place])   Iselin, [place], FNP   2 years ago Follow-up exam   [place], [place], FNP   2 years ago Facial paralysis/Bells palsy   [place], [place], FNP   2 years ago Insulin dependent type 2 diabetes mellitus, uncontrolled ([place])   [place], [place], [place]       Future Appointments             In 1 month Gilford Rile, Martie Lee, NP [facility]

## 2021-08-18 DIAGNOSIS — S83281D Other tear of lateral meniscus, current injury, right knee, subsequent encounter: Secondary | ICD-10-CM | POA: Diagnosis not present

## 2021-08-18 DIAGNOSIS — S83241D Other tear of medial meniscus, current injury, right knee, subsequent encounter: Secondary | ICD-10-CM | POA: Diagnosis not present

## 2021-08-23 ENCOUNTER — Other Ambulatory Visit: Payer: Self-pay | Admitting: Nurse Practitioner

## 2021-08-26 DIAGNOSIS — M5386 Other specified dorsopathies, lumbar region: Secondary | ICD-10-CM | POA: Diagnosis not present

## 2021-08-26 DIAGNOSIS — M5032 Other cervical disc degeneration, mid-cervical region, unspecified level: Secondary | ICD-10-CM | POA: Diagnosis not present

## 2021-08-26 DIAGNOSIS — M9903 Segmental and somatic dysfunction of lumbar region: Secondary | ICD-10-CM | POA: Diagnosis not present

## 2021-08-26 DIAGNOSIS — M9901 Segmental and somatic dysfunction of cervical region: Secondary | ICD-10-CM | POA: Diagnosis not present

## 2021-09-09 DIAGNOSIS — M5032 Other cervical disc degeneration, mid-cervical region, unspecified level: Secondary | ICD-10-CM | POA: Diagnosis not present

## 2021-09-09 DIAGNOSIS — M9903 Segmental and somatic dysfunction of lumbar region: Secondary | ICD-10-CM | POA: Diagnosis not present

## 2021-09-09 DIAGNOSIS — M9901 Segmental and somatic dysfunction of cervical region: Secondary | ICD-10-CM | POA: Diagnosis not present

## 2021-09-09 DIAGNOSIS — M5386 Other specified dorsopathies, lumbar region: Secondary | ICD-10-CM | POA: Diagnosis not present

## 2021-09-18 ENCOUNTER — Ambulatory Visit (HOSPITAL_BASED_OUTPATIENT_CLINIC_OR_DEPARTMENT_OTHER): Payer: BC Managed Care – PPO | Admitting: Family

## 2021-09-23 ENCOUNTER — Ambulatory Visit (HOSPITAL_BASED_OUTPATIENT_CLINIC_OR_DEPARTMENT_OTHER): Payer: BC Managed Care – PPO | Admitting: Family

## 2021-09-23 DIAGNOSIS — M9901 Segmental and somatic dysfunction of cervical region: Secondary | ICD-10-CM | POA: Diagnosis not present

## 2021-09-23 DIAGNOSIS — M5386 Other specified dorsopathies, lumbar region: Secondary | ICD-10-CM | POA: Diagnosis not present

## 2021-09-23 DIAGNOSIS — M5032 Other cervical disc degeneration, mid-cervical region, unspecified level: Secondary | ICD-10-CM | POA: Diagnosis not present

## 2021-09-23 DIAGNOSIS — M9903 Segmental and somatic dysfunction of lumbar region: Secondary | ICD-10-CM | POA: Diagnosis not present

## 2021-09-30 DIAGNOSIS — M5032 Other cervical disc degeneration, mid-cervical region, unspecified level: Secondary | ICD-10-CM | POA: Diagnosis not present

## 2021-09-30 DIAGNOSIS — M5386 Other specified dorsopathies, lumbar region: Secondary | ICD-10-CM | POA: Diagnosis not present

## 2021-09-30 DIAGNOSIS — M9903 Segmental and somatic dysfunction of lumbar region: Secondary | ICD-10-CM | POA: Diagnosis not present

## 2021-09-30 DIAGNOSIS — M9901 Segmental and somatic dysfunction of cervical region: Secondary | ICD-10-CM | POA: Diagnosis not present

## 2021-10-07 DIAGNOSIS — M5386 Other specified dorsopathies, lumbar region: Secondary | ICD-10-CM | POA: Diagnosis not present

## 2021-10-07 DIAGNOSIS — M9901 Segmental and somatic dysfunction of cervical region: Secondary | ICD-10-CM | POA: Diagnosis not present

## 2021-10-07 DIAGNOSIS — M5032 Other cervical disc degeneration, mid-cervical region, unspecified level: Secondary | ICD-10-CM | POA: Diagnosis not present

## 2021-10-07 DIAGNOSIS — M9903 Segmental and somatic dysfunction of lumbar region: Secondary | ICD-10-CM | POA: Diagnosis not present

## 2021-10-16 ENCOUNTER — Encounter: Payer: Self-pay | Admitting: Nurse Practitioner

## 2021-10-16 ENCOUNTER — Ambulatory Visit (INDEPENDENT_AMBULATORY_CARE_PROVIDER_SITE_OTHER): Payer: BC Managed Care – PPO | Admitting: Nurse Practitioner

## 2021-10-16 VITALS — BP 113/73 | HR 97 | Ht 70.0 in | Wt 239.2 lb

## 2021-10-16 DIAGNOSIS — E1165 Type 2 diabetes mellitus with hyperglycemia: Secondary | ICD-10-CM

## 2021-10-16 DIAGNOSIS — E782 Mixed hyperlipidemia: Secondary | ICD-10-CM | POA: Diagnosis not present

## 2021-10-16 DIAGNOSIS — Z794 Long term (current) use of insulin: Secondary | ICD-10-CM

## 2021-10-16 DIAGNOSIS — I10 Essential (primary) hypertension: Secondary | ICD-10-CM | POA: Diagnosis not present

## 2021-10-16 LAB — POCT GLYCOSYLATED HEMOGLOBIN (HGB A1C): Hemoglobin A1C: 9.3 % — AB (ref 4.0–5.6)

## 2021-10-16 MED ORDER — GLIPIZIDE 5 MG PO TABS: 5.0000 mg | ORAL_TABLET | Freq: Two times a day (BID) | ORAL | 0 refills | Status: DC

## 2021-10-16 MED ORDER — OMNIPOD 5 DEXG7G6 PODS GEN 5 MISC: 3 refills | Status: DC

## 2021-10-16 MED ORDER — DEXCOM G6 SENSOR MISC: 3 refills | Status: DC

## 2021-10-16 MED ORDER — OMNIPOD 5 DEXG7G6 INTRO GEN 5 KIT: PACK | 0 refills | Status: DC

## 2021-10-16 MED ORDER — DEXCOM G6 TRANSMITTER MISC: 3 refills | Status: DC

## 2021-10-16 NOTE — Progress Notes (Signed)
Endocrinology Follow Up Note       10/16/2021, 9:42 AM   Subjective:    Patient ID: Chad Haynes, male    DOB: [birth year].  Chad Haynes is being seen in follow up after being seen in consultation for management of currently uncontrolled symptomatic diabetes requested by  Center, [facility].   Past Medical History:  Diagnosis Date   Depression    Diabetes mellitus without complication ([place])    Diabetes mellitus, type II ([place])    GERD (gastroesophageal reflux disease)    H. pylori infection    HTN (hypertension)    Hyperlipidemia    Hypogonadism male    Leg pain    Low magnesium levels    Neck pain    Rhinitis, allergic    Vitamin B12 deficiency    Vitamin D deficiency    White coat hypertension     Past Surgical History:  Procedure Laterality Date   KNEE ARTHROSCOPY Right     Social History   Socioeconomic History   Marital status: Married    Spouse name: Not on file   Number of children: Not on file   Years of education: Not on file   Highest education level: Not on file  Occupational History   Not on file  Tobacco Use   Smoking status: Former    Types: Cigarettes    Quit date: 06/08/1991    Years since quitting: 30.3   Smokeless tobacco: Never  Vaping Use   Vaping Use: Never used  Substance and Sexual Activity   Alcohol use: Yes    Alcohol/week: 0.0 standard drinks of alcohol    Comment: occasionally   Drug use: No   Sexual activity: Yes  Other Topics Concern   Not on file  Social History Narrative   Married. No children. Does not formal exercise.   In school at [employer] full-time. Works part-time job at night.   "Has to walk a mile from the car to classic [employer]"   Social Determinants of Health   Financial Resource Strain: Not on file  Food Insecurity: Not on file  Transportation Needs: Not on file  Physical Activity: Not on file  Stress: Not on file  Social  Connections: Not on file    Family History  Problem Relation Age of Onset   COPD Mother        smoker   Hypertension Father     Outpatient Encounter Medications as of 10/16/2021  Medication Sig   BD PEN NEEDLE NANO 2ND GEN 32G X 4 MM MISC USE AS DIRECTED WITH INSULIN   Continuous Blood Gluc Sensor (DEXCOM G6 SENSOR) MISC Change sensor every 10 days as directed   Continuous Blood Gluc Transmit (DEXCOM G6 TRANSMITTER) MISC Change transmitter every 90 days as directed.   Insulin Disposable Pump (OMNIPOD 5 G6 INTRO, GEN 5,) KIT Change pod every 48-72 hours   Insulin Disposable Pump (OMNIPOD 5 G6 POD, GEN 5,) MISC Change pod every 48-72 hours   insulin regular (NOVOLIN R) 100 units/mL injection Inject 0.1-0.16 mLs (10-16 Units total) into the skin 3 (three) times daily before meals.   Insulin Syringe-Needle U-100 (BD INSULIN SYRINGE  U/F) 31G X 5/16" 0.3 ML MISC USE WITH SLIDING SCALE INSULIN   LEVEMIR FLEXPEN 100 UNIT/ML FlexPen INJECT 50 UNITS INTO THE SKIN DAILY   lisinopril (ZESTRIL) 10 MG tablet Take 1 tablet (10 mg total) by mouth daily.   metoprolol succinate (TOPROL XL) 25 MG 24 hr tablet Take 1 tablet (25 mg total) by mouth daily.   PARoxetine (PAXIL) 20 MG tablet Take 1 tablet (20 mg total) by mouth daily.   rosuvastatin (CRESTOR) 40 MG tablet Take 1 tablet (40 mg total) by mouth daily.   Semaglutide, 2 MG/DOSE, 8 MG/3ML SOPN Inject 2 mg as directed once a week.   [DISCONTINUED] Continuous Blood Gluc Sensor (FREESTYLE LIBRE 14 DAY SENSOR) MISC Use as directed to monitor glucose continuously. Change sensor Q 14 days. Dx: E11.65.   [DISCONTINUED] glipiZIDE (GLUCOTROL) 5 MG tablet Take 1 tablet (5 mg total) by mouth 2 (two) times daily before a meal. TAKE 1 TABLET BY MOUTH TWICE A DAY BEFORE A MEAL/PT NEEDS OV FOR FURTHER REFILLS   glipiZIDE (GLUCOTROL) 5 MG tablet Take 1 tablet (5 mg total) by mouth 2 (two) times daily before a meal. TAKE 1 TABLET BY MOUTH TWICE A DAY BEFORE A MEAL/PT  NEEDS OV FOR FURTHER REFILLS   omeprazole (PRILOSEC OTC) 20 MG tablet Take 20 mg by mouth daily.   QUICKVUE AT-HOME COVID-19 TEST KIT See admin instructions. (Patient not taking: Reported on 10/16/2021)   [DISCONTINUED] metFORMIN (GLUCOPHAGE) 1000 MG tablet Take 1 tablet (1,000 mg total) by mouth 2 (two) times daily with a meal. (Patient not taking: Reported on 07/16/2021)   No facility-administered encounter medications on file as of 10/16/2021.    ALLERGIES: No Known Allergies  VACCINATION STATUS: Immunization History  Administered Date(s) Administered   PFIZER(Purple Top)SARS-COV-2 Vaccination 04/13/2019, 05/13/2019, 12/17/2019   Pneumococcal Polysaccharide-23 01/17/2014   Tdap 05/07/2016    Diabetes He presents for his follow-up diabetic visit. He has type 2 diabetes mellitus. Onset time: was diagnosed at approx age of 33. His disease course has been improving. Hypoglycemia symptoms include sweats and tremors. Associated symptoms include blurred vision, fatigue, polydipsia and polyuria. Hypoglycemia complications include nocturnal hypoglycemia. Symptoms are improving. Diabetic complications include impotence. 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 Current diabetic treatment includes intensive insulin program and oral agent (monotherapy) (and Ozempic). He is compliant with treatment most of the time (skips his Novolin R usually). His weight is decreasing steadily. He is following a generally unhealthy diet. When asked about meal planning, he reported none. He has had a previous visit with a dietitian (Did not find it beneficial). He rarely participates in exercise. His home blood glucose trend is decreasing steadily. His overall blood glucose range is >200 mg/dl. (He presents today with  his CGM showing limited data and no logs. His POCT A1c today is 9.3%, increasing slightly from last visit.  Analysis of his CGM shows TIR 34%, TAR 66%, TBR 0% with active time 57% (improved since last visit).  He continues to forget his Novolin R frequently resulting in postprandial hyperglycemia.) An ACE inhibitor/angiotensin II receptor blocker is being taken. He does not see a podiatrist.Eye exam is current.  Hyperlipidemia This is a chronic problem. The current episode started more than 1 year ago. The problem is controlled. Recent lipid tests were reviewed and are normal. Exacerbating diseases include diabetes and obesity.  Factors aggravating his hyperlipidemia include fatty foods. Current antihyperlipidemic treatment includes statins. The current treatment provides moderate improvement of lipids. Compliance problems include adherence to diet and adherence to exercise.  Risk factors for coronary artery disease include diabetes mellitus, dyslipidemia, hypertension, male sex, obesity and a sedentary lifestyle.  Hypertension This is a chronic problem. The current episode started more than 1 year ago. The problem has been gradually improving since onset. The problem is controlled. Associated symptoms include blurred vision and sweats. There are no associated agents to hypertension. Risk factors for coronary artery disease include diabetes mellitus, dyslipidemia, family history, male gender, obesity and sedentary lifestyle. Past treatments include ACE inhibitors. The current treatment provides mild improvement. Compliance problems include diet and exercise.      Review of systems  Constitutional: + steadily decreasing body weight,  current Body mass index is 34.32 kg/m. , no fatigue, no subjective hyperthermia, no subjective hypothermia Eyes: no blurry vision, no xerophthalmia ENT: no sore throat, no nodules palpated in throat, no dysphagia/odynophagia, no hoarseness Cardiovascular: no chest pain, no  shortness of breath, no palpitations, no leg swelling Respiratory: no cough, no shortness of breath Gastrointestinal: no nausea/vomiting/diarrhea Musculoskeletal: no muscle/joint aches Skin: no rashes, no hyperemia Neurological: no tremors, no numbness, no tingling, no dizziness Psychiatric: no depression, no anxiety  Objective:     BP 113/73 (BP Location: Left Arm, Patient Position: Sitting, Cuff Size: Large)   Pulse 97   Ht '5\' 10"'  (1.778 m)   Wt 239 lb 3.2 oz (108.5 kg)   BMI 34.32 kg/m   Wt Readings from Last 3 Encounters:  10/16/21 239 lb 3.2 oz (108.5 kg)  07/16/21 242 lb (109.8 kg)  04/08/21 244 lb 6.4 oz (110.9 kg)     BP Readings from Last 3 Encounters:  10/16/21 113/73  07/16/21 130/88  04/08/21 128/68      Physical Exam- Limited  Constitutional:  Body mass index is 34.32 kg/m. , not in acute distress, normal state of mind Eyes:  EOMI, no exophthalmos Neck: Supple Cardiovascular: RRR, no murmurs, rubs, or gallops, no edema Respiratory: Adequate breathing efforts, no crackles, rales, rhonchi, or wheezing Musculoskeletal: no gross deformities, strength intact in all four extremities, no gross restriction of joint movements Skin:  no rashes, no hyperemia Neurological: no tremor with outstretched hands   Diabetic Foot Exam - Simple   Simple Foot Form Diabetic Foot exam was performed with the following findings: Yes 10/16/2021  9:37 AM  Visual Inspection No deformities, no ulcerations, no other skin breakdown bilaterally: Yes Sensation Testing Intact to touch and monofilament testing bilaterally: Yes Pulse Check Posterior Tibialis and Dorsalis pulse intact bilaterally: Yes Comments     CMP ( most recent) CMP     Component Value Date/Time   NA 138 06/10/2021 1037   K 4.6 06/10/2021 1037   CL 102 06/10/2021 1037   CO2 19 (L) 06/10/2021 1037   GLUCOSE 234 (H) 06/10/2021 1037   GLUCOSE 243 (H) 03/25/2021 0111   BUN 17 06/10/2021 1037   CREATININE 0.80  06/11/2021 0725   CREATININE 0.81 03/15/2020 1159   CALCIUM 9.3 06/10/2021 1037   PROT 7.2 04/08/2021 0846   ALBUMIN 4.5 04/08/2021 0846   AST 24 04/08/2021 0846   ALT 21 04/08/2021 0846   ALKPHOS 90 04/08/2021 0846   BILITOT 0.4 04/08/2021 0846   GFRNONAA >60 03/25/2021 0111   GFRNONAA 75 02/27/2019 1602   GFRAA >60 02/28/2019 0927   GFRAA 87 02/27/2019 1602  Diabetic Labs (most recent): Lab Results  Component Value Date   HGBA1C 9.3 (A) 10/16/2021   HGBA1C 9.0 07/16/2021   HGBA1C 11.3 (H) 03/24/2021   MICROALBUR 80 02/25/2021   MICROALBUR 0.5 07/13/2019   MICROALBUR 0.5 03/10/2018     Lipid Panel ( most recent) Lipid Panel     Component Value Date/Time   CHOL 105 04/08/2021 0846   TRIG 175 (H) 04/08/2021 0846   HDL 28 (L) 04/08/2021 0846   CHOLHDL 3.8 04/08/2021 0846   CHOLHDL 7.6 (H) 02/27/2019 1602   VLDL 38 (H) 05/07/2016 0940   LDLCALC 48 04/08/2021 0846   LDLCALC  02/27/2019 1602     Comment:     . LDL cholesterol not calculated. Triglyceride levels greater than 400 mg/dL invalidate calculated LDL results. . Reference range: <100 . Desirable range <100 mg/dL for primary prevention;   <70 mg/dL for patients with CHD or diabetic patients  with > or = 2 CHD risk factors. Marland Kitchen LDL-C is now calculated using the Martin-Hopkins  calculation, which is a validated novel method providing  better accuracy than the Friedewald equation in the  estimation of LDL-C.  Cresenciano Genre et al. Annamaria Helling. 3300;762(26): 2061-2068  (http://education.[facility].com/faq/FAQ164)    LDLDIRECT 68 03/15/2020 1159   LABVLDL 29 04/08/2021 0846      Lab Results  Component Value Date   TSH 0.92 03/10/2018   TSH 0.69 05/07/2016           Assessment & Plan:   1) Uncontrolled type 2 diabetes mellitus with hyperglycemia (HCC)  - Chad Haynes has currently uncontrolled symptomatic type 2 DM since 56 years of age.Marland Kitchen   He presents today with his CGM showing limited data  and no logs. His POCT A1c today is 9.3%, increasing slightly from last visit.  Analysis of his CGM shows TIR 34%, TAR 66%, TBR 0% with active time 57% (improved since last visit).  He continues to forget his Novolin R frequently resulting in postprandial hyperglycemia.  -Recent labs reviewed.   - I had a long discussion with him about the progressive nature of diabetes and the pathology behind its complications. -his diabetes is not currently complicated but he remains at a high risk for more acute and chronic complications which include CAD, CVA, CKD, retinopathy, and neuropathy. These are all discussed in detail with him.  - Nutritional counseling repeated at each appointment due to patients tendency to fall back in to old habits.  - The patient admits there is a room for improvement in their diet and drink choices. -  Suggestion is made for the patient to avoid simple carbohydrates from their diet including Cakes, Sweet Desserts / Pastries, Ice Cream, Soda (diet and regular), Sweet Tea, Candies, Chips, Cookies, Sweet Pastries, Store Bought Juices, Alcohol in Excess of 1-2 drinks a day, Artificial Sweeteners, Coffee Creamer, and "Sugar-free" Products. This will help patient to have stable blood glucose profile and potentially avoid unintended weight gain.   - I encouraged the patient to switch to unprocessed or minimally processed complex starch and increased protein intake (animal or plant source), fruits, and vegetables.   - Patient is advised to stick to a routine mealtimes to eat 3 meals a day and avoid unnecessary snacks (to snack only to correct hypoglycemia).  - I have approached him with the following individualized plan to manage  his diabetes and patient agrees:   -He is advised to continue Levemir 50 units with breakfast and Novolin R 10-16 units TID  with meals if glucose is above 90 and he is eating (Specific instructions on how to titrate insulin dosage based on glucose readings  given to patient in writing).  He can also continue his Ozempic 2 mg SQ weekly and Glipizide 5 mg XL daily with breakfast for now.  I approached him about switching gears to help him comply with his insulin needs.  I talked about insulin pumps today and we discussed the advantages/disadvantages of starting one as well as looked at different types.  I think this would be a great option for him to help him remember to take his medications as the insulin will be on his body.  I did send in for the Dexcom G6 and Omnipod combo for him to his pharmacy.  He is concerned with cost.  He is advised to let me know if it is too expensive for him and we will perhaps try the Vgo.  -he is encouraged to use his CGM to continue monitoring blood glucose 4 times daily-top be more consistent with scanning, before meals and before bed, and to call the clinic if he has readings less than 70 or greater than 300 for 3 tests in a row.    - he is warned not to take insulin without proper monitoring per orders. - Adjustment parameters are given to him for hypo and hyperglycemia in writing.  - he will be considered for incretin therapy as appropriate next visit.  - Specific targets for  A1c;  LDL, HDL,  and Triglycerides were discussed with the patient.  2) Blood Pressure /Hypertension:  his blood pressure is controlled to target. There is documentation of white coat syndrome.  he is advised to continue his current medications including Lisinopril 10 mg p.o. daily with breakfast.  3) Lipids/Hyperlipidemia:    Review of his recent lipid panel from 04/08/21 showed controlled LDL at 48 .  he  is advised to continue Simvastatin 80 mg daily at bedtime.  Side effects and precautions discussed with him.    4)  Weight/Diet:  his Body mass index is 34.32 kg/m.  -  clearly complicating his diabetes care.   he is candidate for weight loss. I discussed with him the fact that loss of 5 - 10% of his  current body weight will have the  most impact on his diabetes management.  Exercise, and detailed carbohydrates information provided  -  detailed on discharge instructions.  5) Chronic Care/Health Maintenance: -he is on ACEI/ARB and Statin medications and is encouraged to initiate and continue to follow up with Ophthalmology, Dentist, Podiatrist at least yearly or according to recommendations, and advised to stay away from smoking. I have recommended yearly flu vaccine and pneumonia vaccine at least every 5 years; moderate intensity exercise for up to 150 minutes weekly; and sleep for at least 7 hours a day.  - he is advised to maintain close follow up with Center[facility] for primary care needs, as well as his other providers for optimal and coordinated care.     I spent 44 minutes in the care of the patient today including review of labs from [place], Lipids, Thyroid Function, Hematology (current and previous including abstractions from other facilities); face-to-face time discussing  his blood glucose readings/logs, discussing hypoglycemia and hyperglycemia episodes and symptoms, medications doses, his options of short and long term treatment based on the latest standards of care / guidelines;  discussion about incorporating lifestyle medicine;  and documenting the encounter. Risk reduction counseling performed  per USPSTF guidelines to reduce obesity and cardiovascular risk factors.     Please refer to Patient Instructions for Blood Glucose Monitoring and Insulin/Medications Dosing Guide"  in media tab for additional information. Please  also refer to " Patient Self Inventory" in the Media  tab for reviewed elements of pertinent patient history.  Chad Haynes participated in the discussions, expressed understanding, and voiced agreement with the above plans.  All questions were answered to his satisfaction. he is encouraged to contact clinic should he have any questions or concerns prior to his return visit.   Follow up  plan: - Return in about 3 months (around 01/16/2022) for Diabetes F/U with A1c in office, No previsit labs, Bring meter and logs.  Rayetta Pigg, [facility] [address] [place], [place] [address] Phone: [phone] Fax: [phone]  10/16/2021, 9:42 AM

## 2021-10-27 ENCOUNTER — Other Ambulatory Visit: Payer: Self-pay | Admitting: Family Medicine

## 2021-10-27 NOTE — Telephone Encounter (Signed)
Never been seen here

## 2021-10-30 DIAGNOSIS — Z6834 Body mass index (BMI) 34.0-34.9, adult: Secondary | ICD-10-CM | POA: Diagnosis not present

## 2021-10-30 DIAGNOSIS — D508 Other iron deficiency anemias: Secondary | ICD-10-CM | POA: Diagnosis not present

## 2021-10-30 DIAGNOSIS — Z79899 Other long term (current) drug therapy: Secondary | ICD-10-CM | POA: Diagnosis not present

## 2021-10-30 DIAGNOSIS — E119 Type 2 diabetes mellitus without complications: Secondary | ICD-10-CM | POA: Diagnosis not present

## 2021-10-30 DIAGNOSIS — R300 Dysuria: Secondary | ICD-10-CM | POA: Diagnosis not present

## 2021-10-30 DIAGNOSIS — Z0130 Encounter for examination of blood pressure without abnormal findings: Secondary | ICD-10-CM | POA: Diagnosis not present

## 2021-10-30 DIAGNOSIS — E559 Vitamin D deficiency, unspecified: Secondary | ICD-10-CM | POA: Diagnosis not present

## 2021-10-30 DIAGNOSIS — Z794 Long term (current) use of insulin: Secondary | ICD-10-CM | POA: Diagnosis not present

## 2021-10-30 DIAGNOSIS — I10 Essential (primary) hypertension: Secondary | ICD-10-CM | POA: Diagnosis not present

## 2021-10-30 DIAGNOSIS — E7800 Pure hypercholesterolemia, unspecified: Secondary | ICD-10-CM | POA: Diagnosis not present

## 2021-10-30 DIAGNOSIS — R5383 Other fatigue: Secondary | ICD-10-CM | POA: Diagnosis not present

## 2021-11-04 ENCOUNTER — Telehealth: Payer: Self-pay | Admitting: Nurse Practitioner

## 2021-11-04 NOTE — Telephone Encounter (Signed)
New message    The patient was told that by his insurance company [employer] that the office need to call regarding    Insulin Disposable Pump (OMNIPOD 5 G6 POD, GEN 5,) MISC

## 2021-11-10 ENCOUNTER — Other Ambulatory Visit (HOSPITAL_COMMUNITY): Payer: Self-pay

## 2021-11-10 NOTE — Telephone Encounter (Signed)
Pharmacy Patient Advocate Encounter   Received notification that prior authorization for Omnipod 5 G6 is required/requested.     PA submitted on 11/10/21 to [employer] via CoverMyMeds  Key [id number]  Status is pending

## 2021-11-10 NOTE — Telephone Encounter (Signed)
Good Morning, Rachael,  Have you or a member of your team seen anything from this patient's insurance about the Insulin Disposable Pump (Omnipod 5 G6 POD, Gen 5) MISC?

## 2021-11-10 NOTE — Telephone Encounter (Signed)
Thank you - patient has been notified of the current status of this request.

## 2021-11-11 DIAGNOSIS — M9901 Segmental and somatic dysfunction of cervical region: Secondary | ICD-10-CM | POA: Diagnosis not present

## 2021-11-11 DIAGNOSIS — M5032 Other cervical disc degeneration, mid-cervical region, unspecified level: Secondary | ICD-10-CM | POA: Diagnosis not present

## 2021-11-11 DIAGNOSIS — M9903 Segmental and somatic dysfunction of lumbar region: Secondary | ICD-10-CM | POA: Diagnosis not present

## 2021-11-11 DIAGNOSIS — M5386 Other specified dorsopathies, lumbar region: Secondary | ICD-10-CM | POA: Diagnosis not present

## 2021-11-25 NOTE — Telephone Encounter (Signed)
Patient was called and told about the denial. He states that [facility] called him and told him that it was approved and he states that he has the Omnipod 5 /G 6.

## 2021-11-25 NOTE — Telephone Encounter (Signed)
Noted  

## 2021-11-25 NOTE — Telephone Encounter (Signed)
Will you let the patient know?  It was denied because he isn't compliant with sugar monitoring.  First step is to correct that and then we can try again.

## 2021-11-25 NOTE — Telephone Encounter (Signed)
Goodness!  What a mess.  I'm not sure what happened then

## 2021-11-25 NOTE — Telephone Encounter (Signed)
Pharmacy Patient Advocate Encounter  Received notification from [facility] that the request for prior authorization for Omnipod 5 G6 has been denied due to  .     Joneen Boers, [place] Patient Advocate Specialist [place] Patient Advocate Team Direct Number: [phone] Fax: [phone]

## 2021-11-26 ENCOUNTER — Other Ambulatory Visit: Payer: Self-pay | Admitting: Nurse Practitioner

## 2021-11-26 ENCOUNTER — Telehealth: Payer: Self-pay | Admitting: Nurse Practitioner

## 2021-11-26 DIAGNOSIS — M5032 Other cervical disc degeneration, mid-cervical region, unspecified level: Secondary | ICD-10-CM | POA: Diagnosis not present

## 2021-11-26 DIAGNOSIS — M9901 Segmental and somatic dysfunction of cervical region: Secondary | ICD-10-CM | POA: Diagnosis not present

## 2021-11-26 DIAGNOSIS — M9903 Segmental and somatic dysfunction of lumbar region: Secondary | ICD-10-CM | POA: Diagnosis not present

## 2021-11-26 DIAGNOSIS — M5386 Other specified dorsopathies, lumbar region: Secondary | ICD-10-CM | POA: Diagnosis not present

## 2021-11-26 NOTE — Telephone Encounter (Signed)
Can you follow up on this?  All I know is that the PA team said he was not approved, but he was told by [facility] it was.

## 2021-11-26 NOTE — Telephone Encounter (Signed)
I have reached out to [employer] to follow up.  Because we got that denial letter, I am concerned that this is something he would not be able to get refills on, despite him already having limited supplies at home.  I haven't received any communication about setting up pump parameters or training sessions which usually happens after someone has received their supplies and the green light to proceed.

## 2021-11-26 NOTE — Telephone Encounter (Signed)
I called and Chad Haynes answered. He states that he need a [facility] for the insulin for his pump. He said that the person from omni told him to call and have [place] to call/send it in. Patient states that his blood sugars are being to run really high.

## 2021-11-26 NOTE — Telephone Encounter (Signed)
Patient's wife left a VM stating she needs to speak with someone in reference to his omnipod. She said that one person is telling them they are approved and one person is saying they are not.

## 2021-11-27 NOTE — Telephone Encounter (Signed)
Noted  

## 2021-11-27 NOTE — Telephone Encounter (Signed)
Patient called office today at 1:18 pm wanting to followup on his insulin being called in. On 11/26/2021, Whitney reviewed the last message and she reached out to [place] who is on the [facility] team. As she had not heard from them as she normally would on the about of what is needed for the pump/ the insulin.  This morning she heard back and was advised that they were sending the patient a email to arrange a day/time. Once this was completed ,she would reach out to [facility] for the prescription.  I advised the patient . He stated that the person that told him to call and request the prescription was , Ron. I again explained what and who was going to call him. He confirmed that he had received the email from [place] and they were working on a day/time.  Patient was also told again that we had a denial letter for the [place] , however we understand that he has it. Patient understands that when Whitney heard from [place] she will send the requested prescription to [facility] in [place].

## 2021-11-30 ENCOUNTER — Other Ambulatory Visit: Payer: Self-pay | Admitting: Nurse Practitioner

## 2021-11-30 DIAGNOSIS — E1165 Type 2 diabetes mellitus with hyperglycemia: Secondary | ICD-10-CM

## 2021-12-15 ENCOUNTER — Other Ambulatory Visit: Payer: Self-pay | Admitting: Nurse Practitioner

## 2021-12-15 MED ORDER — INSULIN LISPRO 100 UNIT/ML IJ SOLN
INTRAMUSCULAR | 3 refills | Status: DC
Start: 2021-12-15 — End: 2021-12-18

## 2021-12-17 ENCOUNTER — Telehealth: Payer: Self-pay | Admitting: *Deleted

## 2021-12-17 DIAGNOSIS — M9903 Segmental and somatic dysfunction of lumbar region: Secondary | ICD-10-CM | POA: Diagnosis not present

## 2021-12-17 DIAGNOSIS — M9901 Segmental and somatic dysfunction of cervical region: Secondary | ICD-10-CM | POA: Diagnosis not present

## 2021-12-17 DIAGNOSIS — M5032 Other cervical disc degeneration, mid-cervical region, unspecified level: Secondary | ICD-10-CM | POA: Diagnosis not present

## 2021-12-17 DIAGNOSIS — M5386 Other specified dorsopathies, lumbar region: Secondary | ICD-10-CM | POA: Diagnosis not present

## 2021-12-17 NOTE — Telephone Encounter (Signed)
Patient left a message stating that his pharmacy had told him that a PA was needed for his insulin for the Omnipod. I have not seen anything requesting that this be done. Spoke with [place], she has not heard anything about a PA either.Maudie Mercury - have you heard or seen anything about this? Patient is requesting a call back about this. [phone].

## 2021-12-18 ENCOUNTER — Other Ambulatory Visit: Payer: Self-pay

## 2021-12-18 ENCOUNTER — Other Ambulatory Visit: Payer: Self-pay | Admitting: Nurse Practitioner

## 2021-12-18 MED ORDER — INSULIN LISPRO 100 UNIT/ML IJ SOLN: INTRAMUSCULAR | 1 refills | Status: DC

## 2021-12-18 NOTE — Telephone Encounter (Signed)
Omnipod is approved through Dec 2024. Pt notified

## 2021-12-19 ENCOUNTER — Other Ambulatory Visit: Payer: Self-pay | Admitting: Nurse Practitioner

## 2021-12-19 NOTE — Telephone Encounter (Signed)
He wont need the levemir when he switches to the [employer]

## 2021-12-27 ENCOUNTER — Other Ambulatory Visit: Payer: Self-pay | Admitting: Nurse Practitioner

## 2021-12-27 ENCOUNTER — Other Ambulatory Visit: Payer: Self-pay | Admitting: Student

## 2021-12-27 DIAGNOSIS — E1165 Type 2 diabetes mellitus with hyperglycemia: Secondary | ICD-10-CM

## 2021-12-30 ENCOUNTER — Other Ambulatory Visit: Payer: Self-pay

## 2021-12-30 MED ORDER — NOVOLOG FLEXPEN 100 UNIT/ML ~~LOC~~ SOPN: PEN_INJECTOR | SUBCUTANEOUS | 0 refills | Status: DC

## 2022-01-08 DIAGNOSIS — M9901 Segmental and somatic dysfunction of cervical region: Secondary | ICD-10-CM | POA: Diagnosis not present

## 2022-01-08 DIAGNOSIS — M5386 Other specified dorsopathies, lumbar region: Secondary | ICD-10-CM | POA: Diagnosis not present

## 2022-01-08 DIAGNOSIS — M9903 Segmental and somatic dysfunction of lumbar region: Secondary | ICD-10-CM | POA: Diagnosis not present

## 2022-01-08 DIAGNOSIS — M5032 Other cervical disc degeneration, mid-cervical region, unspecified level: Secondary | ICD-10-CM | POA: Diagnosis not present

## 2022-01-19 ENCOUNTER — Ambulatory Visit: Payer: BC Managed Care – PPO | Admitting: Nurse Practitioner

## 2022-01-19 DIAGNOSIS — E1165 Type 2 diabetes mellitus with hyperglycemia: Secondary | ICD-10-CM

## 2022-01-19 DIAGNOSIS — I10 Essential (primary) hypertension: Secondary | ICD-10-CM

## 2022-01-19 DIAGNOSIS — E782 Mixed hyperlipidemia: Secondary | ICD-10-CM

## 2022-01-28 ENCOUNTER — Other Ambulatory Visit: Payer: Self-pay | Admitting: Nurse Practitioner

## 2022-02-08 ENCOUNTER — Other Ambulatory Visit: Payer: Self-pay | Admitting: Student

## 2022-02-18 ENCOUNTER — Encounter: Payer: Self-pay | Admitting: Nurse Practitioner

## 2022-02-18 ENCOUNTER — Ambulatory Visit (INDEPENDENT_AMBULATORY_CARE_PROVIDER_SITE_OTHER): Payer: BC Managed Care – PPO | Admitting: Nurse Practitioner

## 2022-02-18 VITALS — BP 103/67 | HR 97 | Ht 70.0 in | Wt 237.2 lb

## 2022-02-18 DIAGNOSIS — E1165 Type 2 diabetes mellitus with hyperglycemia: Secondary | ICD-10-CM

## 2022-02-18 DIAGNOSIS — Z794 Long term (current) use of insulin: Secondary | ICD-10-CM | POA: Diagnosis not present

## 2022-02-18 DIAGNOSIS — I25119 Atherosclerotic heart disease of native coronary artery with unspecified angina pectoris: Secondary | ICD-10-CM

## 2022-02-18 DIAGNOSIS — I10 Essential (primary) hypertension: Secondary | ICD-10-CM

## 2022-02-18 DIAGNOSIS — Z79899 Other long term (current) drug therapy: Secondary | ICD-10-CM | POA: Diagnosis not present

## 2022-02-18 DIAGNOSIS — E782 Mixed hyperlipidemia: Secondary | ICD-10-CM

## 2022-02-18 DIAGNOSIS — I2510 Atherosclerotic heart disease of native coronary artery without angina pectoris: Secondary | ICD-10-CM

## 2022-02-18 MED ORDER — NOVOLOG FLEXPEN 100 UNIT/ML ~~LOC~~ SOPN: 10.0000 [IU] | PEN_INJECTOR | Freq: Three times a day (TID) | SUBCUTANEOUS | 3 refills | Status: DC

## 2022-02-18 MED ORDER — ROSUVASTATIN CALCIUM 40 MG PO TABS: 40.0000 mg | ORAL_TABLET | Freq: Every day | ORAL | 3 refills | Status: AC

## 2022-02-18 MED ORDER — TIRZEPATIDE 5 MG/0.5ML ~~LOC~~ SOAJ: 5.0000 mg | SUBCUTANEOUS | 1 refills | Status: DC

## 2022-02-18 MED ORDER — GLIPIZIDE ER 5 MG PO TB24: 5.0000 mg | ORAL_TABLET | Freq: Every day | ORAL | 3 refills | Status: DC

## 2022-02-18 MED ORDER — TIRZEPATIDE 2.5 MG/0.5ML ~~LOC~~ SOAJ: 2.5000 mg | SUBCUTANEOUS | 0 refills | Status: DC

## 2022-02-18 MED ORDER — LEVEMIR FLEXPEN 100 UNIT/ML ~~LOC~~ SOPN: 50.0000 [IU] | PEN_INJECTOR | Freq: Every day | SUBCUTANEOUS | 3 refills | Status: DC

## 2022-02-18 MED ORDER — FREESTYLE LIBRE 3 SENSOR MISC: 6 refills | Status: DC

## 2022-02-18 NOTE — Progress Notes (Signed)
Endocrinology Follow Up Note       02/18/2022, 4:20 PM   Subjective:    Patient ID: Chad Haynes, male    DOB: November 05, 1965.  Chad Haynes is being seen in follow up after being seen in consultation for management of currently uncontrolled symptomatic diabetes requested by  Center[facility].   Past Medical History:  Diagnosis Date   Depression    Diabetes mellitus without complication ([place])    Diabetes mellitus, type II ([place])    GERD (gastroesophageal reflux disease)    H. pylori infection    HTN (hypertension)    Hyperlipidemia    Hypogonadism male    Leg pain    Low magnesium levels    Neck pain    Rhinitis, allergic    Vitamin B12 deficiency    Vitamin D deficiency    White coat hypertension     Past Surgical History:  Procedure Laterality Date   KNEE ARTHROSCOPY Right     Social History   Socioeconomic History   Marital status: Married    Spouse name: Not on file   Number of children: Not on file   Years of education: Not on file   Highest education level: Not on file  Occupational History   Not on file  Tobacco Use   Smoking status: Former    Types: Cigarettes    Quit date: 06/08/1991    Years since quitting: 30.7   Smokeless tobacco: Never  Vaping Use   Vaping Use: Never used  Substance and Sexual Activity   Alcohol use: Yes    Alcohol/week: 0.0 standard drinks of alcohol    Comment: occasionally   Drug use: No   Sexual activity: Yes  Other Topics Concern   Not on file  Social History Narrative   Married. No children. Does not formal exercise.   In school at [employer] full-time. Works part-time job at night.   "Has to walk a mile from the car to classic [employer]"   Social Determinants of Health   Financial Resource Strain: Not on file  Food Insecurity: Not on file  Transportation Needs: Not on file  Physical Activity: Not on file  Stress: Not on file  Social  Connections: Not on file    Family History  Problem Relation Age of Onset   COPD Mother        smoker   Hypertension Father     Outpatient Encounter Medications as of 02/18/2022  Medication Sig   BD PEN NEEDLE NANO 2ND GEN 32G X 4 MM MISC USE AS DIRECTED WITH INSULIN   Continuous Blood Gluc Sensor (FREESTYLE LIBRE 3 SENSOR) MISC Place 1 sensor on the skin every 14 days. Use to check glucose continuously   glipiZIDE (GLUCOTROL XL) 5 MG 24 hr tablet Take 1 tablet (5 mg total) by mouth daily with breakfast.   lisinopril (ZESTRIL) 10 MG tablet Take 1 tablet (10 mg total) by mouth daily.   metoprolol succinate (TOPROL XL) 25 MG 24 hr tablet Take 1 tablet (25 mg total) by mouth daily.   omeprazole (PRILOSEC OTC) 20 MG tablet Take 20 mg by mouth daily.   PARoxetine (PAXIL)  20 MG tablet Take 1 tablet (20 mg total) by mouth daily.   tirzepatide [facility]) 2.5 MG/0.5ML Pen Inject 2.5 mg into the skin once a week.   tirzepatide [facility]) 5 MG/0.5ML Pen Inject 5 mg into the skin once a week.   [DISCONTINUED] glipiZIDE (GLUCOTROL) 5 MG tablet TAKE 1 TABLET (5 MG TOTAL) BY MOUTH 2 (TWO) TIMES DAILY BEFORE A MEAL. NEEDS OV FOR FURTHER REFILLS   [DISCONTINUED] insulin aspart (NOVOLOG FLEXPEN) 100 UNIT/ML FlexPen Use with Omnipod for TDD around 70 units per day   [DISCONTINUED] insulin detemir (LEVEMIR FLEXPEN) 100 UNIT/ML FlexPen INJECT 50 UNITS INTO THE SKIN DAILY   [DISCONTINUED] rosuvastatin (CRESTOR) 40 MG tablet Take 1 tablet (40 mg total) by mouth daily.   insulin aspart (NOVOLOG FLEXPEN) 100 UNIT/ML FlexPen Inject 10-16 Units into the skin 3 (three) times daily with meals.   insulin detemir (LEVEMIR FLEXPEN) 100 UNIT/ML FlexPen Inject 50 Units into the skin at bedtime.   Insulin Syringe-Needle U-100 (BD INSULIN SYRINGE U/F) 31G X 5/16" 0.3 ML MISC USE WITH SLIDING SCALE INSULIN (Patient not taking: Reported on 02/18/2022)   QUICKVUE AT-HOME COVID-19 TEST KIT See admin instructions. (Patient not  taking: Reported on 10/16/2021)   rosuvastatin (CRESTOR) 40 MG tablet Take 1 tablet (40 mg total) by mouth daily.   [DISCONTINUED] Continuous Blood Gluc Sensor (DEXCOM G6 SENSOR) MISC Change sensor every 10 days as directed (Patient not taking: Reported on 02/18/2022)   [DISCONTINUED] Continuous Blood Gluc Transmit (DEXCOM G6 TRANSMITTER) MISC Change transmitter every 90 days as directed. (Patient not taking: Reported on 02/18/2022)   [DISCONTINUED] Insulin Disposable Pump (OMNIPOD 5 G6 INTRO, GEN 5,) KIT Change pod every 48-72 hours (Patient not taking: Reported on 02/18/2022)   [DISCONTINUED] Insulin Disposable Pump (OMNIPOD 5 G6 POD, GEN 5,) MISC Change pod every 48-72 hours (Patient not taking: Reported on 02/18/2022)   [DISCONTINUED] Semaglutide, 2 MG/DOSE, 8 MG/3ML SOPN Inject 2 mg as directed once a week. (Patient not taking: Reported on 02/18/2022)   No facility-administered encounter medications on file as of 02/18/2022.    ALLERGIES: No Known Allergies  VACCINATION STATUS: Immunization History  Administered Date(s) Administered   PFIZER(Purple Top)SARS-COV-2 Vaccination 04/13/2019, 05/13/2019, 12/17/2019   Pneumococcal Polysaccharide-23 01/17/2014   Tdap 05/07/2016    Diabetes He presents for his follow-up diabetic visit. He has type 2 diabetes mellitus. Onset time: was diagnosed at approx age of 42. His disease course has been worsening. Hypoglycemia symptoms include nervousness/anxiousness, pallor, sweats and tremors. Associated symptoms include blurred vision, fatigue, polydipsia, polyuria and weight loss. Symptoms are improving. Diabetic complications include impotence. 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 Current diabetic treatment includes intensive insulin program and oral agent  (monotherapy) (and Ozempic). He is compliant with treatment some of the time (has trouble remembering his insulin at times; been out of Ozempic for a while). His weight is decreasing steadily. He is following a generally unhealthy diet. When asked about meal planning, he reported none. He has had a previous visit with a dietitian (Did not find it beneficial). He rarely participates in exercise. His home blood glucose trend is increasing rapidly. His overall blood glucose range is >200 mg/dl. (He presents today with his CGM showing inconsistent glucose monitoring.  His POCT A1c today is 13.5%, increasing drastically from last visit of 9.3%.  He recently changed from [facility] to [facility]  as insurance did not provide optimal coverage for his Dexcom.  We tried to get insulin pump for him given his tendency to forget to take his insulin pens with him when he is out of the house but insurance did not provide optimal coverage for that therefore he stopped using it and went back to MDI.  He notes he has also been out of his Ozempic for several months as well.  Analysis of his CGM shows TIR 4%, TAR 96%, TBR 0%.  He did have a low in the office today per his CGM (read 54 but checked with glucose monitor and was 100).  He was having symptoms of hypoglycemia as well, therefore he was given juice to bring it back up and encouraged to eat a meal when leaving from his visit today.) An ACE inhibitor/angiotensin II receptor blocker is being taken. He does not see a podiatrist.Eye exam is current.  Hyperlipidemia This is a chronic problem. The current episode started more than 1 year ago. The problem is controlled. Recent lipid tests were reviewed and are normal. Exacerbating diseases include diabetes and obesity. Factors aggravating his hyperlipidemia include fatty foods. Current antihyperlipidemic treatment includes statins. The current treatment provides moderate improvement of lipids. Compliance problems include adherence to diet  and adherence to exercise.  Risk factors for coronary artery disease include diabetes mellitus, dyslipidemia, hypertension, male sex, obesity and a sedentary lifestyle.  Hypertension This is a chronic problem. The current episode started more than 1 year ago. The problem has been gradually improving since onset. The problem is controlled. Associated symptoms include blurred vision and sweats. There are no associated agents to hypertension. Risk factors for coronary artery disease include diabetes mellitus, dyslipidemia, family history, male gender, obesity and sedentary lifestyle. Past treatments include ACE inhibitors. The current treatment provides mild improvement. Compliance problems include diet and exercise.      Review of systems  Constitutional: + steadily decreasing body weight,  current Body mass index is 34.03 kg/m. , no fatigue, no subjective hyperthermia, no subjective hypothermia Eyes: no blurry vision, no xerophthalmia ENT: no sore throat, no nodules palpated in throat, no dysphagia/odynophagia, no hoarseness Cardiovascular: no chest pain, no shortness of breath, no palpitations, no leg swelling Respiratory: no cough, no shortness of breath Gastrointestinal: no nausea/vomiting/diarrhea Musculoskeletal: no muscle/joint aches Skin: no rashes, no hyperemia Neurological: no tremors, no numbness, no tingling, no dizziness Psychiatric: no depression, no anxiety  Objective:     BP 103/67 (BP Location: Left Arm, Patient Position: Sitting, Cuff Size: Large)   Pulse 97   Ht '5\' 10"'$  (1.778 m)   Wt 237 lb 3.2 oz (107.6 kg)   BMI 34.03 kg/m   Wt Readings from Last 3 Encounters:  02/18/22 237 lb 3.2 oz (107.6 kg)  10/16/21 239 lb 3.2 oz (108.5 kg)  07/16/21 242 lb (109.8 kg)     BP Readings from Last 3 Encounters:  02/18/22 103/67  10/16/21 113/73  07/16/21 130/88      Physical Exam- Limited  Constitutional:  Body mass index is 34.03 kg/m. , not in acute distress, normal  state of mind Eyes:  EOMI, no exophthalmos Musculoskeletal: no gross deformities, strength intact in all four extremities, no gross restriction of joint movements Skin:  no rashes, no hyperemia Neurological: no tremor with outstretched hands   Diabetic Foot Exam - Simple   No data filed     CMP ( most recent) CMP     Component Value Date/Time   NA 138 06/10/2021  1037   K 4.6 06/10/2021 1037   CL 102 06/10/2021 1037   CO2 19 (L) 06/10/2021 1037   GLUCOSE 234 (H) 06/10/2021 1037   GLUCOSE 243 (H) 03/25/2021 0111   BUN 17 06/10/2021 1037   CREATININE 0.80 06/11/2021 0725   CREATININE 0.81 03/15/2020 1159   CALCIUM 9.3 06/10/2021 1037   PROT 7.2 04/08/2021 0846   ALBUMIN 4.5 04/08/2021 0846   AST 24 04/08/2021 0846   ALT 21 04/08/2021 0846   ALKPHOS 90 04/08/2021 0846   BILITOT 0.4 04/08/2021 0846   GFRNONAA >60 03/25/2021 0111   GFRNONAA 75 02/27/2019 1602   GFRAA >60 02/28/2019 0927   GFRAA 87 02/27/2019 1602     Diabetic Labs (most recent): Lab Results  Component Value Date   HGBA1C 9.3 (A) 10/16/2021   HGBA1C 9.0 07/16/2021   HGBA1C 11.3 (H) 03/24/2021   MICROALBUR 80 02/25/2021   MICROALBUR 0.5 07/13/2019   MICROALBUR 0.5 03/10/2018     Lipid Panel ( most recent) Lipid Panel     Component Value Date/Time   CHOL 105 04/08/2021 0846   TRIG 175 (H) 04/08/2021 0846   HDL 28 (L) 04/08/2021 0846   CHOLHDL 3.8 04/08/2021 0846   CHOLHDL 7.6 (H) 02/27/2019 1602   VLDL 38 (H) 05/07/2016 0940   LDLCALC 48 04/08/2021 0846   LDLCALC  02/27/2019 1602     Comment:     . LDL cholesterol not calculated. Triglyceride levels greater than 400 mg/dL invalidate calculated LDL results. . Reference range: <100 . Desirable range <100 mg/dL for primary prevention;   <70 mg/dL for patients with CHD or diabetic patients  with > or = 2 CHD risk factors. Marland Kitchen LDL-C is now calculated using the Martin-Hopkins  calculation, which is a validated novel method providing  better  accuracy than the Friedewald equation in the  estimation of LDL-C.  Cresenciano Genre et al. Annamaria Helling. 2505;397(67): 2061-2068  (http://education.[employer].com/faq/FAQ164)    LDLDIRECT 68 03/15/2020 1159   LABVLDL 29 04/08/2021 0846      Lab Results  Component Value Date   TSH 0.92 03/10/2018   TSH 0.69 05/07/2016           Assessment & Plan:   1) Uncontrolled type 2 diabetes mellitus with hyperglycemia (HCC)  - Chad Haynes has currently uncontrolled symptomatic type 2 DM since 57 years of age.Marland Kitchen   He presents today with his CGM showing inconsistent glucose monitoring.  His POCT A1c today is 13.5%, increasing drastically from last visit of 9.3%.  He recently changed from Dexcom to [place] as insurance did not provide optimal coverage for his Dexcom.  We tried to get insulin pump for him given his tendency to forget to take his insulin pens with him when he is out of the house but insurance did not provide optimal coverage for that therefore he stopped using it and went back to MDI.  He notes he has also been out of his Ozempic for several months as well.  Analysis of his CGM shows TIR 4%, TAR 96%, TBR 0%.  He did have a low in the office today per his CGM (read 54 but checked with glucose monitor and was 100).  He was having symptoms of hypoglycemia as well, therefore he was given juice to bring it back up and encouraged to eat a meal when leaving from his visit today.  -Recent labs reviewed.   - I had a long discussion with him about the progressive nature of diabetes  and the pathology behind its complications. -his diabetes is not currently complicated but he remains at a high risk for more acute and chronic complications which include CAD, CVA, CKD, retinopathy, and neuropathy. These are all discussed in detail with him.  - Nutritional counseling repeated at each appointment due to patients tendency to fall back in to old habits.  - The patient admits there is a room for  improvement in their diet and drink choices. -  Suggestion is made for the patient to avoid simple carbohydrates from their diet including Cakes, Sweet Desserts / Pastries, Ice Cream, Soda (diet and regular), Sweet Tea, Candies, Chips, Cookies, Sweet Pastries, Store Bought Juices, Alcohol in Excess of 1-2 drinks a day, Artificial Sweeteners, Coffee Creamer, and "Sugar-free" Products. This will help patient to have stable blood glucose profile and potentially avoid unintended weight gain.   - I encouraged the patient to switch to unprocessed or minimally processed complex starch and increased protein intake (animal or plant source), fruits, and vegetables.   - Patient is advised to stick to a routine mealtimes to eat 3 meals a day and avoid unnecessary snacks (to snack only to correct hypoglycemia).  - I have approached him with the following individualized plan to manage  his diabetes and patient agrees:   -He is advised to continue Levemir 50 units with breakfast and Novolog 10-16 units TID with meals if glucose is above 90 and he is eating (Specific instructions on how to titrate insulin dosage based on glucose readings given to patient in writing).  I started him on [name] 2.5 mg SQ weekly x 1 month then will increase to 5 mg SQ weekly thereafter if he tolerates it well.  He can continue his Glipizide 5 mg XL daily with breakfast as well.  -he is encouraged to use his CGM to continue monitoring blood glucose 4 times daily-top be more consistent with scanning (I sent in for [place]z 3 to his pharmacy), before meals and before bed, and to call the clinic if he has readings less than 70 or greater than 300 for 3 tests in a row.    - he is warned not to take insulin without proper monitoring per orders. - Adjustment parameters are given to him for hypo and hyperglycemia in writing.  - he will be considered for incretin therapy as appropriate next visit.  - Specific targets for  A1c;  LDL, HDL,  and  Triglycerides were discussed with the patient.  2) Blood Pressure /Hypertension:  his blood pressure is controlled to target. There is documentation of white coat syndrome.  he is advised to continue his current medications including Lisinopril 10 mg p.o. daily with breakfast.  3) Lipids/Hyperlipidemia:    Review of his recent lipid panel from 04/08/21 showed controlled LDL at 48 .  he  is advised to continue Simvastatin 80 mg daily at bedtime.  Side effects and precautions discussed with him.    4)  Weight/Diet:  his Body mass index is 34.03 kg/m.  -  clearly complicating his diabetes care.   he is candidate for weight loss. I discussed with him the fact that loss of 5 - 10% of his  current body weight will have the most impact on his diabetes management.  Exercise, and detailed carbohydrates information provided  -  detailed on discharge instructions.  5) Chronic Care/Health Maintenance: -he is on ACEI/ARB and Statin medications and is encouraged to initiate and continue to follow up with Ophthalmology, Dentist, Podiatrist at  least yearly or according to recommendations, and advised to stay away from smoking. I have recommended yearly flu vaccine and pneumonia vaccine at least every 5 years; moderate intensity exercise for up to 150 minutes weekly; and sleep for at least 7 hours a day.  - he is advised to maintain close follow up with [facility] for primary care needs, as well as his other providers for optimal and coordinated care.     I spent  50  minutes in the care of the patient today including review of labs from [place], Lipids, Thyroid Function, Hematology (current and previous including abstractions from other facilities); face-to-face time discussing  his blood glucose readings/logs, discussing hypoglycemia and hyperglycemia episodes and symptoms, medications doses, his options of short and long term treatment based on the latest standards of care / guidelines;  discussion  about incorporating lifestyle medicine;  and documenting the encounter. Risk reduction counseling performed per USPSTF guidelines to reduce obesity and cardiovascular risk factors.     Please refer to Patient Instructions for Blood Glucose Monitoring and Insulin/Medications Dosing Guide"  in media tab for additional information. Please  also refer to " Patient Self Inventory" in the Media  tab for reviewed elements of pertinent patient history.  Chad Haynes participated in the discussions, expressed understanding, and voiced agreement with the above plans.  All questions were answered to his satisfaction. he is encouraged to contact clinic should he have any questions or concerns prior to his return visit.   Follow up plan: - Return in about 1 month (around 03/19/2022) for Diabetes F/U, Bring meter and logs, No previsit labs.  Rayetta Pigg, [facility] [address] [place], [place] [address] Phone: [phone] Fax: [phone]  02/18/2022, 4:20 PM

## 2022-03-13 ENCOUNTER — Other Ambulatory Visit (HOSPITAL_BASED_OUTPATIENT_CLINIC_OR_DEPARTMENT_OTHER): Payer: Self-pay | Admitting: Family

## 2022-03-13 DIAGNOSIS — I25118 Atherosclerotic heart disease of native coronary artery with other forms of angina pectoris: Secondary | ICD-10-CM

## 2022-03-13 NOTE — Telephone Encounter (Signed)
Patient of Dr. Johney Frame. Please review for refill. Thank you!

## 2022-03-22 IMAGING — CT CT HEART MORP W/ CTA COR W/ SCORE W/ CA W/CM &/OR W/O CM
4 of 7 series · 8 of 20 positions shown, 9 images · IV contrast (APPLIED)
Comparison: None.
COMPARISON: None.

Addendum:
EXAM:
OVER-READ INTERPRETATION  CT CHEST

The following report is an over-read performed by radiologist Dr.
Dashawn Valentino [REDACTED] on 05/14/2020. This
over-read does not include interpretation of cardiac or coronary
anatomy or pathology. The coronary calcium score/coronary CTA
interpretation by the cardiologist is attached.
CLINICAL DATA: 54 Year-old Caucasian male
Cardiac/Coronary  CTA
TECHNIQUE: The patient was scanned on a [employer] Force scanner.

[Series 6: best diast 74 % · axial · 0.39mm/px · z∈[+1297,+1338]mm · 2 of 306 slices shown]
[im 102/306  vessel]
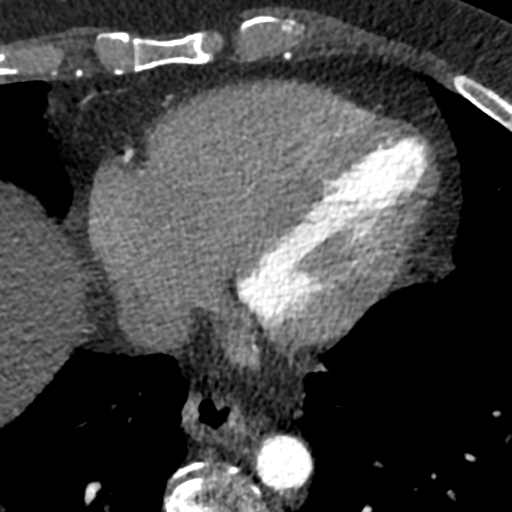
[im 204/306  vessel]
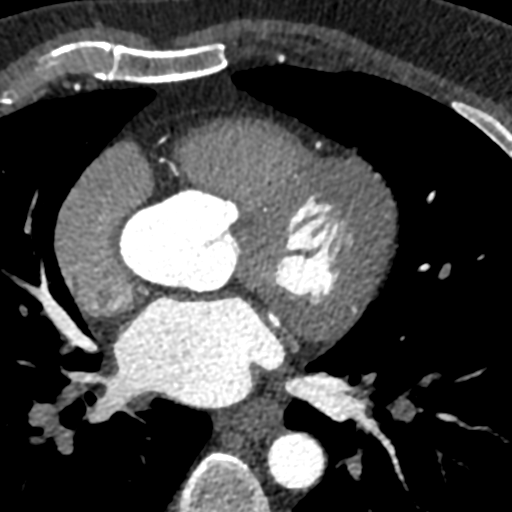

[Series 7: best syst · axial · 0.39mm/px · z∈[+1297,+1338]mm · 2 of 306 slices shown, 3 images]
[im 102/306  vessel]
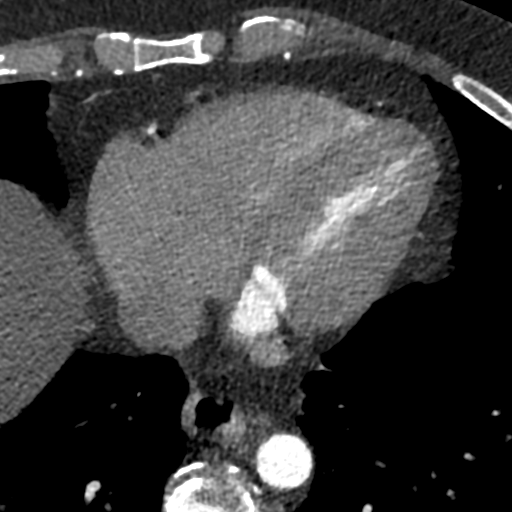
[im 102/306  lung]
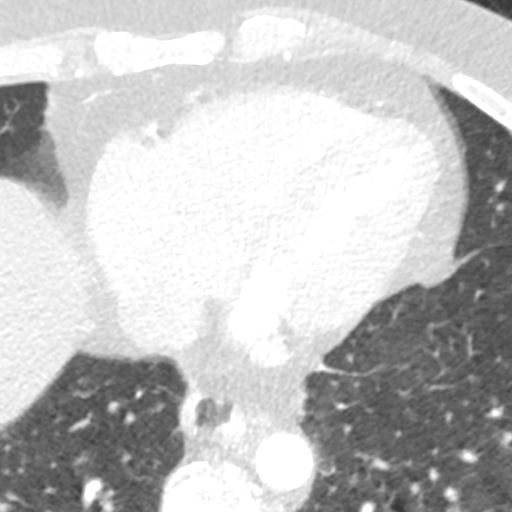
[im 204/306  vessel]
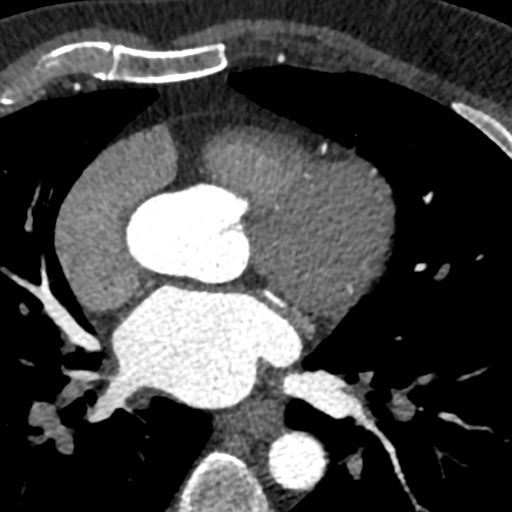

[Series 8: ts diast sharp 74 % · axial · 0.39mm/px · z∈[+1297,+1338]mm · 2 of 306 slices shown]
[im 102/306  lung]
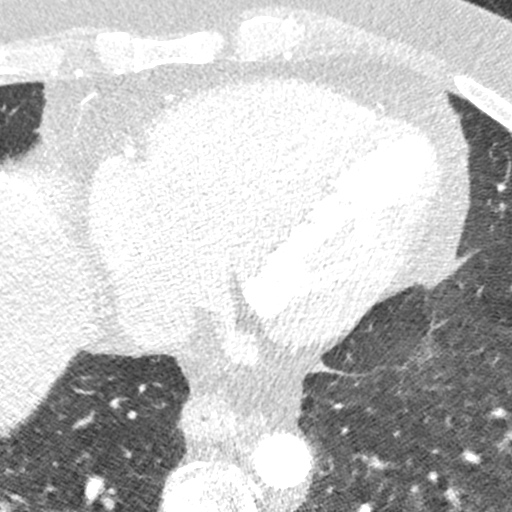
[im 204/306  lung]
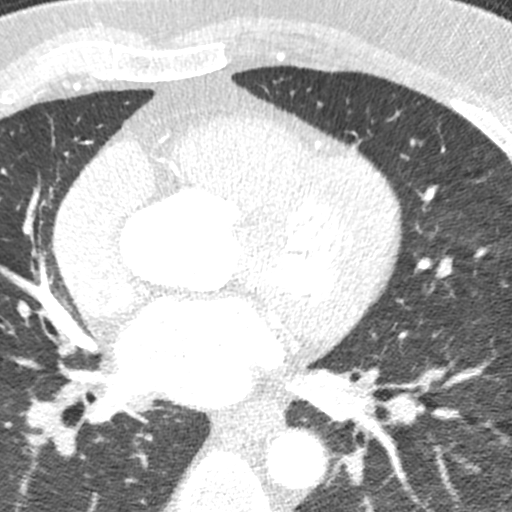

[Series 9: ts syst sharp · axial · 0.39mm/px · z∈[+1297,+1338]mm · 2 of 306 slices shown]
[im 102/306  lung]
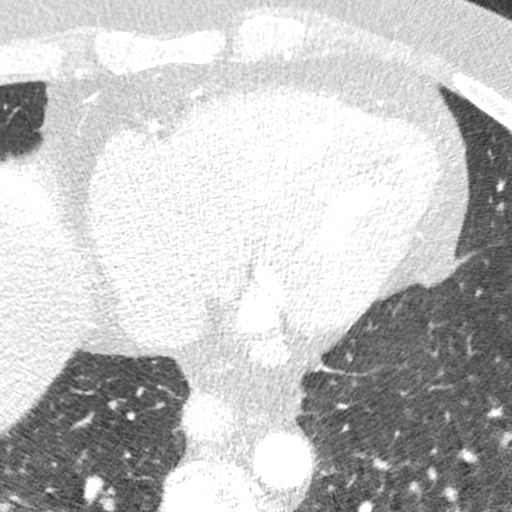
[im 204/306  lung]
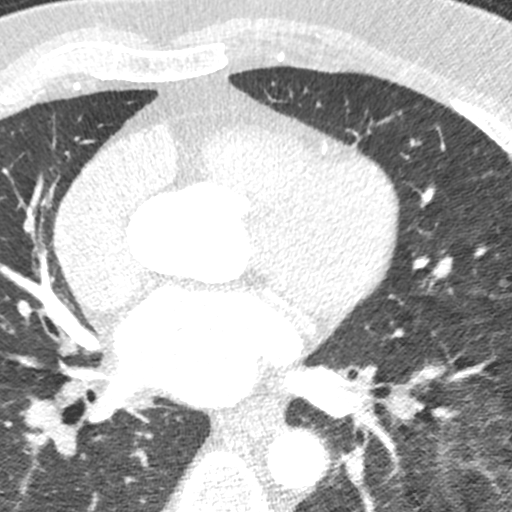

[8 of 20 positions shown; findings below may reference images not displayed]

FINDINGS: Within the visualized portions of the thorax there are no suspicious
appearing pulmonary nodules or masses, there is no acute
consolidative airspace disease, no pleural effusions, no
pneumothorax and no lymphadenopathy. Visualized portions of the
upper abdomen are unremarkable. There are no aggressive appearing
lytic or blastic lesions noted in the visualized portions of the
skeleton.
IMPRESSION: 1. No significant incidental noncardiac findings are noted.
FINDINGS: A 100 kV prospective scan was triggered in the descending thoracic
aorta at 111 HU's. Axial non-contrast 3 mm slices were carried out
through the heart. The data set was analyzed on a dedicated work
station and scored using the Agatson method. Gantry rotation speed
was 250 msecs and collimation was .6 mm. No beta blockade and 0.8 mg
of sl NTG was given. The 3D data set was reconstructed in 5%
intervals of the 67-82 % of the R-R cycle. Diastolic phases were
analyzed on a dedicated work station using MPR, MIP and VRT modes.
The patient received 95 cc of contrast.

Aorta:  Normal size.  No calcifications.  No dissection.

Sinus of Valsalva: Mild dilation 41 mm.

Main Pulmonary Artery: Normal size of the pulmonary artery.

Aortic Valve:  Tri-leaflet.  No calcifications.

Coronary Arteries:  Normal coronary origin.  Right dominance.

Coronary Calcium Score:

Left main: 6

Left anterior descending artery: 38

Left circumflex artery and Ramus Intermedius: 55

Right coronary artery: 71

Total: 169

Percentile: 89th for age, sex, and race matched control.

RCA is a large dominant artery that gives rise to PDA and PLA. There
is a minimal non obstructive (1-24%) calcified plaque in the
proximal vessel. There is a mild non obstructive (24-49%) calcified
plaque in the mid vessel.

Left main is a large artery that gives rise to LAD and LCX arteries.
There is no plaque.

LAD is a large vessel that gives rise to one large D1 Branch that
bifurcates and a small D2 vessel. There are a multiple minimal non
obstructive (1-24%) calcified plaques in the mid vessel. There is a
mild non obstructive (24-49%) calcified plaque in the ostial D1
vessel. There is a minimal non obstructive (1-24%) calcified plaque
in the D1 mid vessel.

LCX is a non-dominant artery that gives rise to one OM1 vessel.
There is a minimal non obstructive (1-24%) calcified plaque in the
proximal vessel.

There is a ramus intermedius vessel with a minimal non obstructive
(1-24%) calcified plaque in the mid vessel. The Glagov effect is
noted.

Other findings:

Normal pulmonary vein drainage into the left atrium.

Normal left atrial appendage without a thrombus.

Incidental left atrial diverticulum noted.

Extra-cardiac findings: See attached radiology report for
non-cardiac structures.
IMPRESSION: 1. Coronary calcium score of 169. This was 89th percentile for age,
sex, and race matched control.

2. Normal coronary origin with right dominance.

3. CAD-RADS 2. Mild non-obstructive CAD (25-49%). Consider
non-atherosclerotic causes of chest pain. Consider preventive
therapy and risk factor modification.

4. Sinus of Valsalva: Mild dilation at 41 mm. Consider secondary
imaging modality (echocardiogram, CTA Aorta Protocol, or MRA Aorta
Protocol) if clinically indicated.

RECOMMENDATIONS:



If CAC = 0, it is reasonable to withhold statin therapy and reassess
in 5 to 10 years, as long as higher risk conditions are absent
(diabetes mellitus, family history of premature CHD in first degree
relatives (males <55 years; females <65 years), cigarette smoking,
LDL >=190 mg/dL or other independent risk factors).

If CAC is 1 to 99, it is reasonable to initiate statin therapy for
patients ?55 years of age.

If CAC is >=100 or >=75th percentile, it is reasonable to initiate
statin therapy at any age.

Cardiology referral should be considered for patients with CAC
scores =400 or >=75th percentile.

*9929 AHA/ACC/AACVPR/AAPA/ABC/BARUCH/SOLOMON/NDY/Lyra/JUMAAN/KESHAWN/CID
Guideline on the Management of Blood Cholesterol: A Report of the
American College of Cardiology/American Heart Association Task Force
on Clinical Practice Guidelines. J Am Coll Cardiol.
6688;73(24):8602-8267.

*** End of Addendum ***
EXAM:
OVER-READ INTERPRETATION  CT CHEST

The following report is an over-read performed by radiologist Dr.
Dashawn Valentino [REDACTED] on 05/14/2020. This
over-read does not include interpretation of cardiac or coronary
anatomy or pathology. The coronary calcium score/coronary CTA
interpretation by the cardiologist is attached.
FINDINGS: Within the visualized portions of the thorax there are no suspicious
appearing pulmonary nodules or masses, there is no acute
consolidative airspace disease, no pleural effusions, no
pneumothorax and no lymphadenopathy. Visualized portions of the
upper abdomen are unremarkable. There are no aggressive appearing
lytic or blastic lesions noted in the visualized portions of the
skeleton.
IMPRESSION: 1. No significant incidental noncardiac findings are noted.

## 2022-03-24 ENCOUNTER — Ambulatory Visit: Payer: BC Managed Care – PPO | Admitting: Nurse Practitioner

## 2022-03-26 ENCOUNTER — Other Ambulatory Visit: Payer: Self-pay | Admitting: Nurse Practitioner

## 2022-03-26 DIAGNOSIS — E1165 Type 2 diabetes mellitus with hyperglycemia: Secondary | ICD-10-CM

## 2022-03-31 ENCOUNTER — Telehealth: Payer: Self-pay | Admitting: Nurse Practitioner

## 2022-03-31 DIAGNOSIS — E1165 Type 2 diabetes mellitus with hyperglycemia: Secondary | ICD-10-CM

## 2022-03-31 MED ORDER — LEVEMIR FLEXPEN 100 UNIT/ML ~~LOC~~ SOPN: 50.0000 [IU] | PEN_INJECTOR | Freq: Every day | SUBCUTANEOUS | 3 refills | Status: DC

## 2022-03-31 MED ORDER — TIRZEPATIDE 5 MG/0.5ML ~~LOC~~ SOAJ: 5.0000 mg | SUBCUTANEOUS | 1 refills | Status: DC

## 2022-03-31 MED ORDER — NOVOLOG FLEXPEN 100 UNIT/ML ~~LOC~~ SOPN: 10.0000 [IU] | PEN_INJECTOR | Freq: Three times a day (TID) | SUBCUTANEOUS | 3 refills | Status: DC

## 2022-03-31 MED ORDER — BD PEN NEEDLE NANO 2ND GEN 32G X 4 MM MISC: 11 refills | Status: DC

## 2022-03-31 NOTE — Telephone Encounter (Signed)
Goodness!  He saw me send them when I was in the room at the [place].  I just resent them, so we shall see.

## 2022-03-31 NOTE — Telephone Encounter (Signed)
Pt stated [facility] on [address] is telling him they have not received prescription for [facility].  Also, the nanopens for for levemir the pharmacy is stating they do not have a prescription for that too.

## 2022-03-31 NOTE — Addendum Note (Signed)
Addended by: Brita Romp on: 03/31/2022 11:35 AM   Modules accepted: Orders

## 2022-04-17 ENCOUNTER — Ambulatory Visit (INDEPENDENT_AMBULATORY_CARE_PROVIDER_SITE_OTHER): Payer: BC Managed Care – PPO | Admitting: Nurse Practitioner

## 2022-04-17 ENCOUNTER — Encounter: Payer: Self-pay | Admitting: Nurse Practitioner

## 2022-04-17 VITALS — BP 128/84 | HR 82 | Ht 70.0 in | Wt 245.8 lb

## 2022-04-17 DIAGNOSIS — E782 Mixed hyperlipidemia: Secondary | ICD-10-CM

## 2022-04-17 DIAGNOSIS — Z794 Long term (current) use of insulin: Secondary | ICD-10-CM | POA: Diagnosis not present

## 2022-04-17 DIAGNOSIS — I10 Essential (primary) hypertension: Secondary | ICD-10-CM | POA: Diagnosis not present

## 2022-04-17 DIAGNOSIS — E1165 Type 2 diabetes mellitus with hyperglycemia: Secondary | ICD-10-CM

## 2022-04-17 MED ORDER — TIRZEPATIDE 2.5 MG/0.5ML ~~LOC~~ SOAJ: 2.5000 mg | SUBCUTANEOUS | 0 refills | Status: DC

## 2022-04-17 MED ORDER — TIRZEPATIDE 5 MG/0.5ML ~~LOC~~ SOAJ: 5.0000 mg | SUBCUTANEOUS | 1 refills | Status: DC

## 2022-04-17 NOTE — Patient Instructions (Signed)

## 2022-04-17 NOTE — Progress Notes (Signed)
Endocrinology Follow Up Note       04/17/2022, 8:24 AM   Subjective:    Patient ID: Chad Haynes, male    DOB: [birth year].  Chad Haynes is being seen in follow up after being seen in consultation for management of currently uncontrolled symptomatic diabetes requested by  Center, [facility].   Past Medical History:  Diagnosis Date   Depression    Diabetes mellitus without complication    Diabetes mellitus, type II    GERD (gastroesophageal reflux disease)    H. pylori infection    HTN (hypertension)    Hyperlipidemia    Hypogonadism male    Leg pain    Low magnesium levels    Neck pain    Rhinitis, allergic    Vitamin B12 deficiency    Vitamin D deficiency    White coat hypertension     Past Surgical History:  Procedure Laterality Date   KNEE ARTHROSCOPY Right     Social History   Socioeconomic History   Marital status: Married    Spouse name: Not on file   Number of children: Not on file   Years of education: Not on file   Highest education level: Not on file  Occupational History   Not on file  Tobacco Use   Smoking status: Former    Types: Cigarettes    Quit date: 06/08/1991    Years since quitting: 30.8   Smokeless tobacco: Never  Vaping Use   Vaping Use: Never used  Substance and Sexual Activity   Alcohol use: Yes    Alcohol/week: 0.0 standard drinks of alcohol    Comment: occasionally   Drug use: No   Sexual activity: Yes  Other Topics Concern   Not on file  Social History Narrative   Married. No children. Does not formal exercise.   In school at [employer] full-time. Works part-time job at night.   "Has to walk a mile from the car to classic [employer]"   Social Determinants of Health   Financial Resource Strain: Not on file  Food Insecurity: Not on file  Transportation Needs: Not on file  Physical Activity: Not on file  Stress: Not on file  Social Connections: Not  on file    Family History  Problem Relation Age of Onset   COPD Mother        smoker   Hypertension Father     Outpatient Encounter Medications as of 04/17/2022  Medication Sig   Continuous Blood Gluc Sensor (FREESTYLE LIBRE 3 SENSOR) MISC Place 1 sensor on the skin every 14 days. Use to check glucose continuously   glipiZIDE (GLUCOTROL XL) 5 MG 24 hr tablet Take 1 tablet (5 mg total) by mouth daily with breakfast.   insulin aspart (NOVOLOG FLEXPEN) 100 UNIT/ML FlexPen Inject 10-16 Units into the skin 3 (three) times daily with meals.   insulin detemir (LEVEMIR FLEXPEN) 100 UNIT/ML FlexPen Inject 50 Units into the skin at bedtime.   Insulin Pen Needle (BD PEN NEEDLE NANO 2ND GEN) 32G X 4 MM MISC Use to inject insulin 4 times daily   lisinopril (ZESTRIL) 10 MG tablet Take 1 tablet (10 mg total)  by mouth daily.   metoprolol succinate (TOPROL-XL) 25 MG 24 hr tablet TAKE 1 TABLET (25 MG TOTAL) BY MOUTH DAILY.   omeprazole (PRILOSEC OTC) 20 MG tablet Take 20 mg by mouth daily.   rosuvastatin (CRESTOR) 40 MG tablet Take 1 tablet (40 mg total) by mouth daily.   Insulin Syringe-Needle U-100 (BD INSULIN SYRINGE U/F) 31G X 5/16" 0.3 ML MISC USE WITH SLIDING SCALE INSULIN (Patient not taking: Reported on 02/18/2022)   PARoxetine (PAXIL) 20 MG tablet Take 1 tablet (20 mg total) by mouth daily.   QUICKVUE AT-HOME COVID-19 TEST KIT See admin instructions. (Patient not taking: Reported on 10/16/2021)   tirzepatide [facility](Chad Haynes) 2.5 MG/0.5ML Pen Inject 2.5 mg into the skin once a week.   tirzepatide [facility](Chad Haynes) 5 MG/0.5ML Pen Inject 5 mg into the skin once a week.   [DISCONTINUED] tirzepatide [facility](Chad Haynes) 2.5 MG/0.5ML Pen Inject 2.5 mg into the skin once a week. (Patient not taking: Reported on 04/17/2022)   [DISCONTINUED] tirzepatide [facility](Chad Haynes) 5 MG/0.5ML Pen Inject 5 mg into the skin once a week. (Patient not taking: Reported on 04/17/2022)   No facility-administered encounter medications on file as of 04/17/2022.     ALLERGIES: No Known Allergies  VACCINATION STATUS: Immunization History  Administered Date(s) Administered   PFIZER(Purple Top)SARS-COV-2 Vaccination 04/13/2019, 05/13/2019, 12/17/2019   Pneumococcal Polysaccharide-23 01/17/2014   Tdap 05/07/2016    Diabetes He presents for his follow-up diabetic visit. He has type 2 diabetes mellitus. Onset time: was diagnosed at approx age of 57. There are no hypoglycemic associated symptoms. Associated symptoms include blurred vision, fatigue, polydipsia and polyuria. Pertinent negatives for diabetes include no weight loss. Symptoms are stable. Diabetic complications include impotence. 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 Current diabetic treatment includes intensive insulin program and oral agent (monotherapy) (and Ozempic). He is compliant with treatment some of the time (has trouble remembering his insulin at times; [facility] His weight is increasing steadily. He is following a vegetarian diet. When asked about meal planning, he reported none. He has had a previous visit with a dietitian (Did not find it beneficial). He rarely participates in exercise. (He presents today with no meter or logs to review.  He ran out of sensors, due to pick up more today.  He is not due for another A1c.  He also notes he was unable to get his Chad KeenMounjaro, says pharmacy needed more info but we never received any request.  He notes he has been more consistent with taking his medications, but still hasn't seen much improvement in his readings.  He notes he is on vegetarian diet.) An ACE inhibitor/angiotensin II receptor blocker is being taken. He does not see a podiatrist.Eye exam is current.  Hyperlipidemia This is a chronic problem. The current  episode started more than 1 year ago. The problem is controlled. Recent lipid tests were reviewed and are normal. Exacerbating diseases include diabetes and obesity. Factors aggravating his hyperlipidemia include fatty foods. Current antihyperlipidemic treatment includes statins. The current treatment provides moderate improvement of lipids. Compliance problems include adherence to diet and adherence to exercise.  Risk factors for coronary artery disease include diabetes mellitus, dyslipidemia, hypertension, male sex, obesity and a sedentary lifestyle.  Hypertension This is a chronic problem. The current episode started more than 1 year ago. The problem has been gradually improving  since onset. The problem is controlled. Associated symptoms include blurred vision. There are no associated agents to hypertension. Risk factors for coronary artery disease include diabetes mellitus, dyslipidemia, family history, male gender, obesity and sedentary lifestyle. Past treatments include ACE inhibitors. The current treatment provides mild improvement. Compliance problems include diet and exercise.      Review of systems  Constitutional: + increasing body weight,  current Body mass index is 35.27 kg/m. , no fatigue, no subjective hyperthermia, no subjective hypothermia Eyes: no blurry vision, no xerophthalmia ENT: no sore throat, no nodules palpated in throat, no dysphagia/odynophagia, no hoarseness Cardiovascular: no chest pain, no shortness of breath, no palpitations, no leg swelling Respiratory: no cough, no shortness of breath Gastrointestinal: no nausea/vomiting/diarrhea Musculoskeletal: no muscle/joint aches Skin: no rashes, no hyperemia Neurological: no tremors, no numbness, no tingling, no dizziness Psychiatric: no depression, no anxiety  Objective:     BP 128/84 (BP Location: Right Arm, Patient Position: Sitting, Cuff Size: Large)   Pulse 82   Ht 5\' 10"  (1.778 m)   Wt 245 lb 12.8 oz (111.5 kg)    BMI 35.27 kg/m   Wt Readings from Last 3 Encounters:  04/17/22 245 lb 12.8 oz (111.5 kg)  02/18/22 237 lb 3.2 oz (107.6 kg)  10/16/21 239 lb 3.2 oz (108.5 kg)     BP Readings from Last 3 Encounters:  04/17/22 128/84  02/18/22 103/67  10/16/21 113/73       Physical Exam- Limited  Constitutional:  Body mass index is 35.27 kg/m. , not in acute distress, normal state of mind Eyes:  EOMI, no exophthalmos Musculoskeletal: no gross deformities, strength intact in all four extremities, no gross restriction of joint movements Skin:  no rashes, no hyperemia Neurological: no tremor with outstretched hands   Diabetic Foot Exam - Simple   No data filed     CMP ( most recent) CMP     Component Value Date/Time   NA 138 06/10/2021 1037   K 4.6 06/10/2021 1037   CL 102 06/10/2021 1037   CO2 19 (L) 06/10/2021 1037   GLUCOSE 234 (H) 06/10/2021 1037   GLUCOSE 243 (H) 03/25/2021 0111   BUN 17 06/10/2021 1037   CREATININE 0.80 06/11/2021 0725   CREATININE 0.81 03/15/2020 1159   CALCIUM 9.3 06/10/2021 1037   PROT 7.2 04/08/2021 0846   ALBUMIN 4.5 04/08/2021 0846   AST 24 04/08/2021 0846   ALT 21 04/08/2021 0846   ALKPHOS 90 04/08/2021 0846   BILITOT 0.4 04/08/2021 0846   GFRNONAA >60 03/25/2021 0111   GFRNONAA 75 02/27/2019 1602   GFRAA >60 02/28/2019 0927   GFRAA 87 02/27/2019 1602     Diabetic Labs (most recent): Lab Results  Component Value Date   HGBA1C 9.3 (A) 10/16/2021   HGBA1C 9.0 07/16/2021   HGBA1C 11.3 (H) 03/24/2021   MICROALBUR 80 02/25/2021   MICROALBUR 0.5 07/13/2019   MICROALBUR 0.5 03/10/2018     Lipid Panel ( most recent) Lipid Panel     Component Value Date/Time   CHOL 105 04/08/2021 0846   TRIG 175 (H) 04/08/2021 0846   HDL 28 (L) 04/08/2021 0846   CHOLHDL 3.8 04/08/2021 0846   CHOLHDL 7.6 (H) 02/27/2019 1602   VLDL 38 (H) 05/07/2016 0940   LDLCALC 48 04/08/2021 0846   LDLCALC  02/27/2019 1602     Comment:     . LDL cholesterol not  calculated. Triglyceride levels greater than 400 mg/dL invalidate calculated LDL results. . Reference range: <100 .  Desirable range <100 mg/dL for primary prevention;   <70 mg/dL for patients with CHD or diabetic patients  with > or = 2 CHD risk factors. Marland Kitchen. LDL-C is now calculated using the Martin-Hopkins  calculation, which is a validated novel method providing  better accuracy than the Friedewald equation in the  estimation of LDL-C.  Horald PollenMartin SS et al. Lenox AhrJAMA. 1610;960(452013;310(19): 2061-2068  (http://education.[facility].com/faq/FAQ164)    LDLDIRECT 68 03/15/2020 1159   LABVLDL 29 04/08/2021 0846      Lab Results  Component Value Date   TSH 0.92 03/10/2018   TSH 0.69 05/07/2016           Assessment & Plan:   1) Uncontrolled type 2 diabetes mellitus with hyperglycemia (HCC)  - Chad Haynes has currently uncontrolled symptomatic type 2 DM since 57 years of age.Marland Kitchen.   He presents today with no meter or logs to review.  He ran out of sensors, due to pick up more today.  He is not due for another A1c.  He also notes he was unable to get his Chad KeenMounjaro, says pharmacy needed more info but we never received any request.  He notes he has been more consistent with taking his medications, but still hasn't seen much improvement in his readings.  He notes he is on vegetarian diet.  -Recent labs reviewed.   - I had a long discussion with him about the progressive nature of diabetes and the pathology behind its complications. -his diabetes is not currently complicated but he remains at a high risk for more acute and chronic complications which include CAD, CVA, CKD, retinopathy, and neuropathy. These are all discussed in detail with him.  - Nutritional counseling repeated at each appointment due to patients tendency to fall back in to old habits.  - The patient admits there is a room for improvement in their diet and drink choices. -  Suggestion is made for the patient to avoid simple  carbohydrates from their diet including Cakes, Sweet Desserts / Pastries, Ice Cream, Soda (diet and regular), Sweet Tea, Candies, Chips, Cookies, Sweet Pastries, Store Bought Juices, Alcohol in Excess of 1-2 drinks a day, Artificial Sweeteners, Coffee Creamer, and "Sugar-free" Products. This will help patient to have stable blood glucose profile and potentially avoid unintended weight gain.   - I encouraged the patient to switch to unprocessed or minimally processed complex starch and increased protein intake (animal or plant source), fruits, and vegetables.   - Patient is advised to stick to a routine mealtimes to eat 3 meals a day and avoid unnecessary snacks (to snack only to correct hypoglycemia).  - I have approached him with the following individualized plan to manage  his diabetes and patient agrees:   -Given the lack of readings, no changes will be made to his medications today.  He is advised to continue Levemir 50 units with breakfast and Novolog 10-16 units TID with meals if glucose is above 90 and he is eating (Specific instructions on how to titrate insulin dosage based on glucose readings given to patient in writing).  He can continue his Glipizide 5 mg XL daily with breakfast as well.  I started him on Chad Haynes 2.5 mg SQ weekly x 1 month then will increase to 5 mg SQ weekly thereafter if he tolerates it well but he was unable to get it from the pharmacy, I did resend this script today.    -he is encouraged to use his CGM to continue monitoring blood glucose 4 times  daily-top be more consistent with scanning (I sent in for Libre 3 to his pharmacy), before meals and before bed, and to call the clinic if he has readings less than 70 or greater than 300 for 3 tests in a row.    - he is warned not to take insulin without proper monitoring per orders. - Adjustment parameters are given to him for hypo and hyperglycemia in writing.  - he will be considered for incretin therapy as appropriate  next visit.  - Specific targets for  A1c;  LDL, HDL,  and Triglycerides were discussed with the patient.  2) Blood Pressure /Hypertension:  his blood pressure is controlled to target. There is documentation of white coat syndrome.  he is advised to continue his current medications including Lisinopril 10 mg p.o. daily with breakfast.  3) Lipids/Hyperlipidemia:    Review of his recent lipid panel from 04/08/21 showed controlled LDL at 48 .  he  is advised to continue Simvastatin 80 mg daily at bedtime.  Side effects and precautions discussed with him.    4)  Weight/Diet:  his Body mass index is 35.27 kg/m.  -  clearly complicating his diabetes care.   he is candidate for weight loss. I discussed with him the fact that loss of 5 - 10% of his  current body weight will have the most impact on his diabetes management.  Exercise, and detailed carbohydrates information provided  -  detailed on discharge instructions.  5) Chronic Care/Health Maintenance: -he is on ACEI/ARB and Statin medications and is encouraged to initiate and continue to follow up with Ophthalmology, Dentist, Podiatrist at least yearly or according to recommendations, and advised to stay away from smoking. I have recommended yearly flu vaccine and pneumonia vaccine at least every 5 years; moderate intensity exercise for up to 150 minutes weekly; and sleep for at least 7 hours a day.  - he is advised to maintain close follow up with Center, [facility] for primary care needs, as well as his other providers for optimal and coordinated care.      I spent  32  minutes in the care of the patient today including review of labs from CMP, Lipids, Thyroid Function, Hematology (current and previous including abstractions from other facilities); face-to-face time discussing  his blood glucose readings/logs, discussing hypoglycemia and hyperglycemia episodes and symptoms, medications doses, his options of short and long term treatment based  on the latest standards of care / guidelines;  discussion about incorporating lifestyle medicine;  and documenting the encounter. Risk reduction counseling performed per USPSTF guidelines to reduce obesity and cardiovascular risk factors.     Please refer to Patient Instructions for Blood Glucose Monitoring and Insulin/Medications Dosing Guide"  in media tab for additional information. Please  also refer to " Patient Self Inventory" in the Media  tab for reviewed elements of pertinent patient history.  Chad Haynes participated in the discussions, expressed understanding, and voiced agreement with the above plans.  All questions were answered to his satisfaction. he is encouraged to contact clinic should he have any questions or concerns prior to his return visit.   Follow up plan: - Return in about 3 months (around 07/17/2022) for Diabetes F/U with A1c in office, Previsit labs, Bring meter and logs.   Ronny Bacon, [facility] [address] [place], [place] [address] Phone: [phone] Fax: [phone]  04/17/2022, 8:24 AM

## 2022-04-21 ENCOUNTER — Telehealth: Payer: Self-pay | Admitting: Pharmacy Technician

## 2022-04-21 ENCOUNTER — Telehealth: Payer: Self-pay | Admitting: *Deleted

## 2022-04-21 ENCOUNTER — Other Ambulatory Visit (HOSPITAL_COMMUNITY): Payer: Self-pay

## 2022-04-21 NOTE — Telephone Encounter (Signed)
We have not seen a PA for this patient's [name]. May we get a upate for the RX team?

## 2022-04-21 NOTE — Telephone Encounter (Signed)
Pharmacy Patient Advocate Encounter   Received notification from Pt calls msgs/LPN that prior authorization for [facility] 5mg  is required/requested.  Per Test Claim: 1ST & 2ND FILL OF THIS MAINTENANCE MED ALLOWED @ RETAIL, COINSURANCE PENALTY WILL APPLY ON 3RD FILL   Started PA on 04/21/22 to (ins) [employer]  via [employer] or (Medicaid) confirmation # [facility] Waiting for clinical questions to populate.

## 2022-04-21 NOTE — Telephone Encounter (Signed)
The other options are Trulicity and Ozempic, both of which he has tried and did not tolerated.  I did not see any denial or request for PA for this product though.

## 2022-04-21 NOTE — Telephone Encounter (Signed)
Mr, Pasty Arch left a message that his insurance will not cover the [facility] for him. He is asking what are his other options.  I will call him back and ask that he call his pharmacy /insurance and see what alternative would be. I will also send to the RX PA team to request a status on this PA.

## 2022-04-21 NOTE — Telephone Encounter (Signed)
PA requested. New Encounter created for PA.

## 2022-04-24 NOTE — Telephone Encounter (Signed)
Submitted request today 04/24/22 - his ins has him listed as Corlis Hove.

## 2022-04-27 ENCOUNTER — Other Ambulatory Visit (HOSPITAL_COMMUNITY): Payer: Self-pay

## 2022-04-27 NOTE — Telephone Encounter (Signed)
Patient was called and made aware. 

## 2022-04-27 NOTE — Telephone Encounter (Signed)
Pharmacy Patient Advocate Encounter  Prior Authorization for [facility] 5MG /0.5ML pen-injectors has been approved by [employer] [place] [employer]  (ins).    PA # PA Case ID #: [id number] Effective dates: 04/24/22 through 04/24/23  Per test claim: Copay is $10 (for 1 month- all the ins would allow at our pharmacies) He can fill it when he's ready for it and done with the 2.5mg .

## 2022-04-29 ENCOUNTER — Telehealth: Payer: Self-pay | Admitting: *Deleted

## 2022-04-29 NOTE — Telephone Encounter (Signed)
Patient called the office this morning. He had went to get his [facility] after it had been approved, and his co pay was going to be $10. He was told that he would owe t hem $1027.00. He also said that he was told that [facility] from our office.  [facility] the [facility]. I was told that he had a deductible to meet and this was why it was going to cost the patient that much. They said that they did not need a code from [place].  Patient made aware, shared that I would address this with the PA team to see if they could help.

## 2022-04-30 ENCOUNTER — Other Ambulatory Visit (HOSPITAL_COMMUNITY): Payer: Self-pay

## 2022-04-30 NOTE — Telephone Encounter (Signed)
Patient was called and given the information we received from the PA team. He will check his insurance and will let us know.Marland Kitchen

## 2022-05-05 ENCOUNTER — Other Ambulatory Visit: Payer: Self-pay | Admitting: Nurse Practitioner

## 2022-05-05 DIAGNOSIS — E1165 Type 2 diabetes mellitus with hyperglycemia: Secondary | ICD-10-CM

## 2022-05-08 DIAGNOSIS — D508 Other iron deficiency anemias: Secondary | ICD-10-CM | POA: Diagnosis not present

## 2022-05-08 DIAGNOSIS — Z6834 Body mass index (BMI) 34.0-34.9, adult: Secondary | ICD-10-CM | POA: Diagnosis not present

## 2022-05-08 DIAGNOSIS — E559 Vitamin D deficiency, unspecified: Secondary | ICD-10-CM | POA: Diagnosis not present

## 2022-05-08 DIAGNOSIS — D539 Nutritional anemia, unspecified: Secondary | ICD-10-CM | POA: Diagnosis not present

## 2022-05-08 DIAGNOSIS — Z0130 Encounter for examination of blood pressure without abnormal findings: Secondary | ICD-10-CM | POA: Diagnosis not present

## 2022-05-08 DIAGNOSIS — E119 Type 2 diabetes mellitus without complications: Secondary | ICD-10-CM | POA: Diagnosis not present

## 2022-05-08 DIAGNOSIS — E7800 Pure hypercholesterolemia, unspecified: Secondary | ICD-10-CM | POA: Diagnosis not present

## 2022-05-08 DIAGNOSIS — R5383 Other fatigue: Secondary | ICD-10-CM | POA: Diagnosis not present

## 2022-05-08 DIAGNOSIS — I10 Essential (primary) hypertension: Secondary | ICD-10-CM | POA: Diagnosis not present

## 2022-05-08 DIAGNOSIS — Z125 Encounter for screening for malignant neoplasm of prostate: Secondary | ICD-10-CM | POA: Diagnosis not present

## 2022-05-08 DIAGNOSIS — Z794 Long term (current) use of insulin: Secondary | ICD-10-CM | POA: Diagnosis not present

## 2022-05-22 ENCOUNTER — Telehealth: Payer: Self-pay | Admitting: *Deleted

## 2022-05-22 NOTE — Telephone Encounter (Signed)
Question about the safety of a compounded Ozempic  His wife is concerned about this, patient's PCP ordered this. It is in a vial and he has to draw up his dosage. He has not started this yet. He cannot get the [facility] and this is why his PCP ordered this.  They are asking if this is safe?

## 2022-05-25 NOTE — Telephone Encounter (Signed)
Patient was called and made aware. He will be coming by tomorrow to pick up a Ozempic Pen , and a application for assistance program.

## 2022-05-25 NOTE — Telephone Encounter (Signed)
Talked with patient. He states that it is not getting the medicine or its availably. It is his insurance, he would have to pay a 1000.00 a month for it. May we give him a pen or 2? He is currently on the 0.25  mg dose of Ozempic.  He is going to reach out to places and try to get his own insurance.

## 2022-05-25 NOTE — Telephone Encounter (Signed)
Yes, we can give him a sample pen.  Take 0.25 mg weekly x 2 weeks (if he has already been on the 0.25 mg weekly for a while, then increase to 0.5 mg weekly).  Give him info on patient assistance program to see if he qualifies for financial assistance.

## 2022-05-25 NOTE — Telephone Encounter (Signed)
The compounded Ozempic is not approved by the FDA.  I caution against using those products.  There is a shortage of the [facility] products right now but it should be corrected within the next few months.  I recommend calling around to different pharmacies to see if they have it in stock.  Better to be safe than sorry.

## 2022-06-17 ENCOUNTER — Telehealth: Payer: Self-pay | Admitting: Cardiology

## 2022-06-17 ENCOUNTER — Other Ambulatory Visit: Payer: Self-pay | Admitting: Nurse Practitioner

## 2022-06-17 DIAGNOSIS — I25118 Atherosclerotic heart disease of native coronary artery with other forms of angina pectoris: Secondary | ICD-10-CM

## 2022-06-17 MED ORDER — METOPROLOL SUCCINATE ER 25 MG PO TB24
25.0000 mg | ORAL_TABLET | Freq: Every day | ORAL | 0 refills | Status: DC
Start: 2022-06-17 — End: 2023-01-01

## 2022-06-17 NOTE — Telephone Encounter (Signed)
NVM, you are so right, he was using the pump but could not afford it any longer. So just the instructions need to change.  Inject 10-16 units TID with meals

## 2022-06-17 NOTE — Telephone Encounter (Signed)
*  STAT* If patient is at the pharmacy, call can be transferred to refill team.   1. Which medications need to be refilled? (please list name of each medication and dose if known)   metoprolol succinate (TOPROL-XL) 25 MG 24 hr tablet   2. Which pharmacy/location (including street and city if local pharmacy) is medication to be sent to?  [id number] [place],  - [address]   3. Do they need a 30 day or 90 day supply?   90 day  Wife states patient is completely out of this medication.

## 2022-06-17 NOTE — Telephone Encounter (Signed)
He should be getting the vials, not the pens

## 2022-06-17 NOTE — Telephone Encounter (Signed)
Pt's medication was sent to pt's pharmacy as requested. Confirmation received.  °

## 2022-06-17 NOTE — Telephone Encounter (Signed)
His last visit (04-17-22) stated he was using the pen. I didn't see where he was on a pump at that time but wanted to make sure with you that that had not changed.

## 2022-07-07 ENCOUNTER — Telehealth: Payer: Self-pay | Admitting: Nurse Practitioner

## 2022-07-07 NOTE — Telephone Encounter (Signed)
Pt came by and would like to know what he needs to do in the mean time about not having any ozempic. He said that he is working on his pt assistance but insurance is not any good. We currently do not have any samples

## 2022-07-07 NOTE — Telephone Encounter (Signed)
Unfortunately we are stuck at the moment.  He can call us back next week to see if we have received any samples that he can use in the interim while we wait on the patient assistance.

## 2022-07-24 ENCOUNTER — Ambulatory Visit: Payer: BC Managed Care – PPO | Admitting: Nurse Practitioner

## 2022-07-24 DIAGNOSIS — Z7985 Long-term (current) use of injectable non-insulin antidiabetic drugs: Secondary | ICD-10-CM

## 2022-07-24 DIAGNOSIS — E1165 Type 2 diabetes mellitus with hyperglycemia: Secondary | ICD-10-CM

## 2022-07-24 DIAGNOSIS — I10 Essential (primary) hypertension: Secondary | ICD-10-CM

## 2022-07-24 DIAGNOSIS — E782 Mixed hyperlipidemia: Secondary | ICD-10-CM

## 2022-07-24 DIAGNOSIS — Z794 Long term (current) use of insulin: Secondary | ICD-10-CM

## 2022-07-24 DIAGNOSIS — Z7984 Long term (current) use of oral hypoglycemic drugs: Secondary | ICD-10-CM

## 2022-07-29 ENCOUNTER — Ambulatory Visit: Payer: BC Managed Care – PPO | Admitting: Nurse Practitioner

## 2022-08-17 ENCOUNTER — Ambulatory Visit: Payer: BC Managed Care – PPO | Admitting: Physician Assistant

## 2022-08-26 DIAGNOSIS — E7800 Pure hypercholesterolemia, unspecified: Secondary | ICD-10-CM | POA: Diagnosis not present

## 2022-08-26 DIAGNOSIS — E559 Vitamin D deficiency, unspecified: Secondary | ICD-10-CM | POA: Diagnosis not present

## 2022-08-26 DIAGNOSIS — I10 Essential (primary) hypertension: Secondary | ICD-10-CM | POA: Diagnosis not present

## 2022-08-26 DIAGNOSIS — E119 Type 2 diabetes mellitus without complications: Secondary | ICD-10-CM | POA: Diagnosis not present

## 2022-08-26 DIAGNOSIS — Z0000 Encounter for general adult medical examination without abnormal findings: Secondary | ICD-10-CM | POA: Diagnosis not present

## 2022-08-26 DIAGNOSIS — Z794 Long term (current) use of insulin: Secondary | ICD-10-CM | POA: Diagnosis not present

## 2022-08-26 DIAGNOSIS — R5383 Other fatigue: Secondary | ICD-10-CM | POA: Diagnosis not present

## 2022-08-26 DIAGNOSIS — Z6835 Body mass index (BMI) 35.0-35.9, adult: Secondary | ICD-10-CM | POA: Diagnosis not present

## 2022-09-08 DIAGNOSIS — Z6835 Body mass index (BMI) 35.0-35.9, adult: Secondary | ICD-10-CM | POA: Diagnosis not present

## 2022-09-08 DIAGNOSIS — E119 Type 2 diabetes mellitus without complications: Secondary | ICD-10-CM | POA: Diagnosis not present

## 2022-09-08 DIAGNOSIS — Z794 Long term (current) use of insulin: Secondary | ICD-10-CM | POA: Diagnosis not present

## 2022-09-08 DIAGNOSIS — E559 Vitamin D deficiency, unspecified: Secondary | ICD-10-CM | POA: Diagnosis not present

## 2022-09-08 DIAGNOSIS — E7800 Pure hypercholesterolemia, unspecified: Secondary | ICD-10-CM | POA: Diagnosis not present

## 2022-09-08 DIAGNOSIS — E6609 Other obesity due to excess calories: Secondary | ICD-10-CM | POA: Diagnosis not present

## 2022-09-23 ENCOUNTER — Other Ambulatory Visit: Payer: Self-pay

## 2022-11-03 ENCOUNTER — Encounter: Payer: Self-pay | Admitting: Physician Assistant

## 2022-11-03 ENCOUNTER — Ambulatory Visit: Payer: BC Managed Care – PPO | Attending: Physician Assistant | Admitting: Physician Assistant

## 2022-11-03 VITALS — BP 124/78 | HR 92 | Resp 16 | Ht 70.0 in | Wt 251.2 lb

## 2022-11-03 DIAGNOSIS — I25118 Atherosclerotic heart disease of native coronary artery with other forms of angina pectoris: Secondary | ICD-10-CM

## 2022-11-03 DIAGNOSIS — E1165 Type 2 diabetes mellitus with hyperglycemia: Secondary | ICD-10-CM | POA: Diagnosis not present

## 2022-11-03 DIAGNOSIS — I10 Essential (primary) hypertension: Secondary | ICD-10-CM | POA: Diagnosis not present

## 2022-11-03 DIAGNOSIS — I77810 Thoracic aortic ectasia: Secondary | ICD-10-CM

## 2022-11-03 DIAGNOSIS — E785 Hyperlipidemia, unspecified: Secondary | ICD-10-CM | POA: Diagnosis not present

## 2022-11-03 NOTE — Progress Notes (Signed)
Cardiology Office Note:  .   Date:  11/03/2022  ID:  Chad Haynes, DOB 1965-03-27, MRN [id number] PCP: [facility]  [facility] Providers Cardiologist:  Meriam Sprague, MD (Inactive) {  History of Present Illness: .   Chad Haynes is a 57 y.o. male with a past medical history of diabetes melitis type II, CAD, hypertension, aortic root dilation, HLD, GERD, and depression here for follow-up appointment  He was seen in consultation 04/10/2020 by Dr. Shari Prows due to worsening dyspnea with family history notable for CAD in his father (status post CABG at age 51) and paternal grandfather with massive MI at 47 (deceased at that time).  Cardiac CTA and echo were recommended.  Echo 05/14/2020 was normal with LVEF 55 to 60%, tricuspid and aortic valve with trivial regurgitation, mild dilation of the aortic root 39 mm.  Cardiac CTA 05/2020 with coronary calcium score 169 placing him in the 89th percentile for sex, age, and race matched controls.  Mild nonobstructive CAD (25 with 29%) in multiple vessels.  Admitted 3/13 through 03/25/2021 after presenting with abdominal pain and vomiting.  Diabetes was found to be poorly controlled with glucose in the 300s.  Tachycardic as high as 150s which improved after fluid bolus.  Presented for follow-up 04/08/2021 and was doing well since discharge.  Reported no SOB or DOE.  No chest pain/pressure or tightness.  No edema, orthopnea, PND.  No palpitations.  Heart rate at home is often in the 80s since addition of metoprolol in the ED.  He was interested in switching to extended release dosing.  No formal exercise routine with plans to start walking.  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  Today, he still gets out of breath and it comes out of no where and gets winded. It has not gotten any worse. He can't do much exercise since he has chronic knee and back pain and gained some weight because of that. He has an endo for his DM2. He  gets annual A1C and last one was 9.3. he was taking ozempic and then jumped to 970 a month. Metformin 1,000 BID. Glipizide. He is on insulin as well. I think his SOB is likely exercise intolerance since he is doing less now due to his orthopedic issues, its also intermittent which leads me to believe its not cardiac. Will update a CT for his ascending aortic dilation. Echo and cardiac CT also reviewed with the patient.  Reports no shortness of breath nor dyspnea on exertion. Reports no chest pain, pressure, or tightness. No edema, orthopnea, PND. Reports no palpitations.   ROS: pertinent ROS in HPI  Studies Reviewed: .        Echo 05/2020   1. Left ventricular ejection fraction, by estimation, is 55 to 60%. The  left ventricle has normal function. The left ventricle has no regional  wall motion abnormalities. Left ventricular diastolic parameters were  normal.   2. Right ventricular systolic function is normal. The right ventricular  size is normal. Tricuspid regurgitation signal is inadequate for assessing  PA pressure.   3. The mitral valve is grossly normal. Trivial mitral valve  regurgitation. No evidence of mitral stenosis.   4. The aortic valve is tricuspid. Aortic valve regurgitation is trivial.  No aortic stenosis is present.   5. Aortic dilatation noted. There is mild dilatation of the aortic root,  measuring 39 mm.   6. The inferior vena cava is normal in size with  greater than 50%  respiratory variability, suggesting right atrial pressure of 3 mmHg.    Cardiac CTA 05/14/2020 Aorta:  Normal size.  No calcifications.  No dissection.   Sinus of Valsalva: Mild dilation 41 mm.   Main Pulmonary Artery: Normal size of the pulmonary artery.   Aortic Valve:  Tri-leaflet.  No calcifications.   Coronary Arteries:  Normal coronary origin.  Right dominance.   Coronary Calcium Score:   Left main: 6   Left anterior descending artery: 38   Left circumflex artery and Ramus  Intermedius: 55   Right coronary artery: 71   Total: 169   Percentile: 89th for age, sex, and race matched control.   RCA is a large dominant artery that gives rise to PDA and PLA. There is a minimal non obstructive (1-24%) calcified plaque in the proximal vessel. There is a mild non obstructive (24-49%) calcified plaque in the mid vessel.   Left main is a large artery that gives rise to LAD and LCX arteries. There is no plaque.   LAD is a large vessel that gives rise to one large D1 Branch that bifurcates and a small D2 vessel. There are a multiple minimal non obstructive (1-24%) calcified plaques in the mid vessel. There is a mild non obstructive (24-49%) calcified plaque in the ostial D1 vessel. There is a minimal non obstructive (1-24%) calcified plaque in the D1 mid vessel.   LCX is a non-dominant artery that gives rise to one OM1 vessel. There is a minimal non obstructive (1-24%) calcified plaque in the proximal vessel.   There is a ramus intermedius vessel with a minimal non obstructive (1-24%) calcified plaque in the mid vessel. The Glagov effect is noted.   Other findings:   Normal pulmonary vein drainage into the left atrium.   Normal left atrial appendage without a thrombus.   Incidental left atrial diverticulum noted.   Extra-cardiac findings: See attached radiology report for non-cardiac structures.   IMPRESSION: 1. Coronary calcium score of 169. This was 89th percentile for age, sex, and race matched control.   2. Normal coronary origin with right dominance.   3. CAD-RADS 2. Mild non-obstructive CAD (25-49%). Consider non-atherosclerotic causes of chest pain. Consider preventive therapy and risk factor modification.   4. Sinus of Valsalva: Mild dilation at 41 mm. Consider secondary imaging modality (echocardiogram, CTA Aorta Protocol, or MRA Aorta Protocol) if clinically indicated      Physical Exam:   VS:  BP 124/78 (BP Location: Left Arm,  Patient Position: Sitting, Cuff Size: Large)   Pulse 92   Resp 16   Ht 5\' 10"  (1.778 m)   Wt 251 lb 3.2 oz (113.9 kg)   SpO2 96%   BMI 36.04 kg/m    Wt Readings from Last 3 Encounters:  11/03/22 251 lb 3.2 oz (113.9 kg)  04/17/22 245 lb 12.8 oz (111.5 kg)  02/18/22 237 lb 3.2 oz (107.6 kg)    GEN: Well nourished, well developed in no acute distress NECK: No JVD; No carotid bruits CARDIAC: RRR, no murmurs, rubs, gallops RESPIRATORY:  Clear to auscultation without rales, wheezing or rhonchi  ABDOMEN: Soft, non-tender, non-distended EXTREMITIES:  No edema; No deformity   ASSESSMENT AND PLAN: .   1.  CAD -no chest pain but has lots of SOB which is chronic (unchanged the last 2 years) -Continue current medication regimen including lisinopril 10 mg daily, metoprolol succinate 25 mg daily, Crestor 40 mg daily -He will be due for an updated lipid  panel when he sees his PCP in a few months -Luckily no chest pain but chronic shortness of breath  2.  HTN -well controlled today -continue current medications  3.  Aortic root dilation -Will update a CTA -Luckily, blood pressure has been well-controlled -Continue current medication regimen  4.  HLD -He will be due for lipid panel with PCP at next visit -Last LDL was 48, triglycerides 161, and HDL 28  5. DM2   -He is on metformin 1000 mg twice daily, glipizide 5 mg with breakfast, insulin sliding scale and Levemir 50 units at bedtime -Continue to follow-up with endocrinology, will be due for an updated A1c    Dispo: He can return in a year to see Dr. Lynnette Caffey  Signed, Sharlene Dory, PA-C

## 2022-11-03 NOTE — Patient Instructions (Signed)
Medication Instructions:  Your physician recommends that you continue on your current medications as directed. Please refer to the Current Medication list given to you today.  *If you need a refill on your cardiac medications before your next appointment, please call your pharmacy*  Lab Work: Lipid panel with primary care provider when you see them If you have labs (blood work) drawn today and your tests are completely normal, you will receive your results only by: MyChart Message (if you have MyChart) OR A paper copy in the mail If you have any lab test that is abnormal or we need to change your treatment, we will call you to review the results.  Testing/Procedures: Your provider has recommended that you have a chest cta.  Follow-Up: At [facility], you and your health needs are our priority.  As part of our continuing mission to provide you with exceptional heart care, we have created designated Provider Care Teams.  These Care Teams include your primary Cardiologist (physician) and Advanced Practice Providers (APPs -  Physician Assistants and Nurse Practitioners) who all work together to provide you with the care you need, when you need it.  Your next appointment:   1 year(s)  Provider:   Dr Lynnette Caffey  Heart-Healthy Eating Plan Many factors influence your heart health, including eating and exercise habits. Heart health is also called coronary health. Coronary risk increases with abnormal blood fat (lipid) levels. A heart-healthy eating plan includes limiting unhealthy fats, increasing healthy fats, limiting salt (sodium) intake, and making other diet and lifestyle changes. What is my plan? Your health care provider may recommend that: You limit your fat intake to _________% or less of your total calories each day. You limit your saturated fat intake to _________% or less of your total calories each day. You limit the amount of cholesterol in your diet to less than _________  mg per day. You limit the amount of sodium in your diet to less than _________ mg per day. What are tips for following this plan? Cooking Cook foods using methods other than frying. Baking, boiling, grilling, and broiling are all good options. Other ways to reduce fat include: Removing the skin from poultry. Removing all visible fats from meats. Steaming vegetables in water or broth. Meal planning  At meals, imagine dividing your plate into fourths: Fill one-half of your plate with vegetables and green salads. Fill one-fourth of your plate with whole grains. Fill one-fourth of your plate with lean protein foods. Eat 2-4 cups of vegetables per day. One cup of vegetables equals 1 cup (91 g) broccoli or cauliflower florets, 2 medium carrots, 1 large bell pepper, 1 large sweet potato, 1 large tomato, 1 medium white potato, 2 cups (150 g) raw leafy greens. Eat 1-2 cups of fruit per day. One cup of fruit equals 1 small apple, 1 large banana, 1 cup (237 g) mixed fruit, 1 large orange,  cup (82 g) dried fruit, 1 cup (240 mL) 100% fruit juice. Eat more foods that contain soluble fiber. Examples include apples, broccoli, carrots, beans, peas, and barley. Aim to get 25-30 g of fiber per day. Increase your consumption of legumes, nuts, and seeds to 4-5 servings per week. One serving of dried beans or legumes equals  cup (90 g) cooked, 1 serving of nuts is  oz (12 almonds, 24 pistachios, or 7 walnut halves), and 1 serving of seeds equals  oz (8 g). Fats Choose healthy fats more often. Choose monounsaturated and polyunsaturated fats, such as  olive and canola oils, avocado oil, flaxseeds, walnuts, almonds, and seeds. Eat more omega-3 fats. Choose salmon, mackerel, sardines, tuna, flaxseed oil, and ground flaxseeds. Aim to eat fish at least 2 times each week. Check food labels carefully to identify foods with trans fats or high amounts of saturated fat. Limit saturated fats. These are found in animal  products, such as meats, butter, and cream. Plant sources of saturated fats include palm oil, palm kernel oil, and coconut oil. Avoid foods with partially hydrogenated oils in them. These contain trans fats. Examples are stick margarine, some tub margarines, cookies, crackers, and other baked goods. Avoid fried foods. General information Eat more home-cooked food and less restaurant, buffet, and fast food. Limit or avoid alcohol. Limit foods that are high in added sugar and simple starches such as foods made using white refined flour (white breads, pastries, sweets). Lose weight if you are overweight. Losing just 5-10% of your body weight can help your overall health and prevent diseases such as diabetes and heart disease. Monitor your sodium intake, especially if you have high blood pressure. Talk with your health care provider about your sodium intake. Try to incorporate more vegetarian meals weekly. What foods should I eat? Fruits All fresh, canned (in natural juice), or frozen fruits. Vegetables Fresh or frozen vegetables (raw, steamed, roasted, or grilled). Green salads. Grains Most grains. Choose whole wheat and whole grains most of the time. Rice and pasta, including brown rice and pastas made with whole wheat. Meats and other proteins Lean, well-trimmed beef, veal, pork, and lamb. Chicken and [place] without skin. All fish and shellfish. Wild duck, rabbit, pheasant, and venison. Egg whites or low-cholesterol egg substitutes. Dried beans, peas, lentils, and tofu. Seeds and most nuts. Dairy Low-fat or nonfat cheeses, including ricotta and mozzarella. Skim or 1% milk (liquid, powdered, or evaporated). Buttermilk made with low-fat milk. Nonfat or low-fat yogurt. Fats and oils Non-hydrogenated (trans-free) margarines. Vegetable oils, including soybean, sesame, sunflower, olive, avocado, peanut, safflower, corn, canola, and cottonseed. Salad dressings or mayonnaise made with a vegetable  oil. Beverages Water (mineral or sparkling). Coffee and tea. Unsweetened ice tea. Diet beverages. Sweets and desserts Sherbet, gelatin, and fruit ice. Small amounts of dark chocolate. Limit all sweets and desserts. Seasonings and condiments All seasonings and condiments. The items listed above may not be a complete list of foods and beverages you can eat. Contact a dietitian for more options. What foods should I avoid? Fruits Canned fruit in heavy syrup. Fruit in cream or butter sauce. Fried fruit. Limit coconut. Vegetables Vegetables cooked in cheese, cream, or butter sauce. Fried vegetables. Grains Breads made with saturated or trans fats, oils, or whole milk. Croissants. Sweet rolls. Donuts. High-fat crackers, such as cheese crackers and chips. Meats and other proteins Fatty meats, such as hot dogs, ribs, sausage, bacon, rib-eye roast or steak. High-fat deli meats, such as salami and bologna. Caviar. Domestic duck and goose. Organ meats, such as liver. Dairy Cream, sour cream, cream cheese, and creamed cottage cheese. Whole-milk cheeses. Whole or 2% milk (liquid, evaporated, or condensed). Whole buttermilk. Cream sauce or high-fat cheese sauce. Whole-milk yogurt. Fats and oils Meat fat, or shortening. Cocoa butter, hydrogenated oils, palm oil, coconut oil, palm kernel oil. Solid fats and shortenings, including bacon fat, salt pork, lard, and butter. Nondairy cream substitutes. Salad dressings with cheese or sour cream. Beverages Regular sodas and any drinks with added sugar. Sweets and desserts Frosting. Pudding. Cookies. Cakes. Pies. Milk chocolate or white chocolate. Buttered syrups.  Full-fat ice cream or ice cream drinks. The items listed above may not be a complete list of foods and beverages to avoid. Contact a dietitian for more information. Summary Heart-healthy meal planning includes limiting unhealthy fats, increasing healthy fats, limiting salt (sodium) intake and making  other diet and lifestyle changes. Lose weight if you are overweight. Losing just 5-10% of your body weight can help your overall health and prevent diseases such as diabetes and heart disease. Focus on eating a balance of foods, including fruits and vegetables, low-fat or nonfat dairy, lean protein, nuts and legumes, whole grains, and heart-healthy oils and fats. This information is not intended to replace advice given to you by your health care provider. Make sure you discuss any questions you have with your health care provider. Document Revised: 02/03/2021 Document Reviewed: 02/03/2021 Elsevier Patient Education  2024 [employer] Low-Sodium Eating Plan Salt (sodium) helps you keep a healthy balance of fluids in your body. Too much sodium can raise your blood pressure. It can also cause fluid and waste to be held in your body. Your health care provider or dietitian may recommend a low-sodium eating plan if you have high blood pressure (hypertension), kidney disease, liver disease, or heart failure. Eating less sodium can help lower your blood pressure and reduce swelling. It can also protect your heart, liver, and kidneys. What are tips for following this plan? Reading food labels  Check food labels for the amount of sodium per serving. If you eat more than one serving, you must multiply the listed amount by the number of servings. Choose foods with less than 140 milligrams (mg) of sodium per serving. Avoid foods with 300 mg of sodium or more per serving. Always check how much sodium is in a product, even if the label says "unsalted" or "no salt added." Shopping  Buy products labeled as "low-sodium" or "no salt added." Buy fresh foods. Avoid canned foods and pre-made or frozen meals. Avoid canned, cured, or processed meats. Buy breads that have less than 80 mg of sodium per slice. Cooking  Eat more home-cooked food. Try to eat less restaurant, buffet, and fast food. Try not to add salt  when you cook. Use salt-free seasonings or herbs instead of table salt or sea salt. Check with your provider or pharmacist before using salt substitutes. Cook with plant-based oils, such as canola, sunflower, or olive oil. Meal planning When eating at a restaurant, ask if your food can be made with less salt or no salt. Avoid dishes labeled as brined, pickled, cured, or smoked. Avoid dishes made with soy sauce, miso, or teriyaki sauce. Avoid foods that have monosodium glutamate (MSG) in them. MSG may be added to some restaurant food, sauces, soups, bouillon, and canned foods. Make meals that can be grilled, baked, poached, roasted, or steamed. These are often made with less sodium. General information Try to limit your sodium intake to 1,500-2,300 mg each day, or the amount told by your provider. What foods should I eat? Fruits Fresh, frozen, or canned fruit. Fruit juice. Vegetables Fresh or frozen vegetables. "No salt added" canned vegetables. "No salt added" tomato sauce and paste. Low-sodium or reduced-sodium tomato and vegetable juice. Grains Low-sodium cereals, such as oats, puffed wheat and rice, and shredded wheat. Low-sodium crackers. Unsalted rice. Unsalted pasta. Low-sodium bread. Whole grain breads and whole grain pasta. Meats and other proteins Fresh or frozen meat, poultry, seafood, and fish. These should have no added salt. Low-sodium canned tuna and salmon. Unsalted nuts.  Dried peas, beans, and lentils without added salt. Unsalted canned beans. Eggs. Unsalted nut butters. Dairy Milk. Soy milk. Cheese that is naturally low in sodium, such as ricotta cheese, fresh mozzarella, or Swiss cheese. Low-sodium or reduced-sodium cheese. Cream cheese. Yogurt. Seasonings and condiments Fresh and dried herbs and spices. Salt-free seasonings. Low-sodium mustard and ketchup. Sodium-free salad dressing. Sodium-free light mayonnaise. Fresh or refrigerated horseradish. Lemon juice. Vinegar. Other  foods Homemade, reduced-sodium, or low-sodium soups. Unsalted popcorn and pretzels. Low-salt or salt-free chips. The items listed above may not be all the foods and drinks you can have. Talk to a dietitian to learn more. What foods should I avoid? Vegetables Sauerkraut, pickled vegetables, and relishes. Olives. [place] fries. Onion rings. Regular canned vegetables, except low-sodium or reduced-sodium items. Regular canned tomato sauce and paste. Regular tomato and vegetable juice. Frozen vegetables in sauces. Grains Instant hot cereals. Bread stuffing, pancake, and biscuit mixes. Croutons. Seasoned rice or pasta mixes. Noodle soup cups. Boxed or frozen macaroni and cheese. Regular salted crackers. Self-rising flour. Meats and other proteins Meat or fish that is salted, canned, smoked, spiced, or pickled. Precooked or cured meat, such as sausages or meat loaves. Tomasa Blase. Ham. Pepperoni. Hot dogs. Corned beef. Chipped beef. Salt pork. Jerky. Pickled herring, anchovies, and sardines. Regular canned tuna. Salted nuts. Dairy Processed cheese and cheese spreads. Hard cheeses. Cheese curds. Blue cheese. Feta cheese. String cheese. Regular cottage cheese. Buttermilk. Canned milk. Fats and oils Salted butter. Regular margarine. Ghee. Bacon fat. Seasonings and condiments Onion salt, garlic salt, seasoned salt, table salt, and sea salt. Canned and packaged gravies. Worcestershire sauce. Tartar sauce. Barbecue sauce. Teriyaki sauce. Soy sauce, including reduced-sodium soy sauce. Steak sauce. Fish sauce. Oyster sauce. Cocktail sauce. Horseradish that you find on the shelf. Regular ketchup and mustard. Meat flavorings and tenderizers. Bouillon cubes. Hot sauce. Pre-made or packaged marinades. Pre-made or packaged taco seasonings. Relishes. Regular salad dressings. Salsa. Other foods Salted popcorn and pretzels. Corn chips and puffs. Potato and tortilla chips. Canned or dried soups. Pizza. Frozen entrees and pot  pies. The items listed above may not be all the foods and drinks you should avoid. Talk to a dietitian to learn more. This information is not intended to replace advice given to you by your health care provider. Make sure you discuss any questions you have with your health care provider. Document Revised: 01/15/2022 Document Reviewed: 01/15/2022 Elsevier Patient Education  2024 [employer].

## 2022-11-06 ENCOUNTER — Encounter: Payer: Self-pay | Admitting: Physician Assistant

## 2022-11-11 DIAGNOSIS — Z6835 Body mass index (BMI) 35.0-35.9, adult: Secondary | ICD-10-CM | POA: Diagnosis not present

## 2022-11-11 DIAGNOSIS — Z1211 Encounter for screening for malignant neoplasm of colon: Secondary | ICD-10-CM | POA: Diagnosis not present

## 2022-11-11 DIAGNOSIS — Z794 Long term (current) use of insulin: Secondary | ICD-10-CM | POA: Diagnosis not present

## 2022-11-11 DIAGNOSIS — I10 Essential (primary) hypertension: Secondary | ICD-10-CM | POA: Diagnosis not present

## 2022-11-11 DIAGNOSIS — E119 Type 2 diabetes mellitus without complications: Secondary | ICD-10-CM | POA: Diagnosis not present

## 2022-11-11 DIAGNOSIS — E6609 Other obesity due to excess calories: Secondary | ICD-10-CM | POA: Diagnosis not present

## 2022-11-18 ENCOUNTER — Ambulatory Visit
Admission: RE | Admit: 2022-11-18 | Discharge: 2022-11-18 | Disposition: A | Discharge status: Home / Self Care | Payer: BC Managed Care – PPO | Source: Ambulatory Visit | Attending: Physician Assistant | Admitting: Physician Assistant

## 2022-11-18 ENCOUNTER — Telehealth: Payer: Self-pay | Admitting: "Endocrinology

## 2022-11-18 DIAGNOSIS — E782 Mixed hyperlipidemia: Secondary | ICD-10-CM

## 2022-11-18 DIAGNOSIS — E66811 Obesity, class 1: Secondary | ICD-10-CM

## 2022-11-18 DIAGNOSIS — E559 Vitamin D deficiency, unspecified: Secondary | ICD-10-CM

## 2022-11-18 DIAGNOSIS — I700 Atherosclerosis of aorta: Secondary | ICD-10-CM | POA: Diagnosis not present

## 2022-11-18 DIAGNOSIS — I7121 Aneurysm of the ascending aorta, without rupture: Secondary | ICD-10-CM | POA: Diagnosis not present

## 2022-11-18 DIAGNOSIS — I77810 Thoracic aortic ectasia: Secondary | ICD-10-CM

## 2022-11-18 DIAGNOSIS — I2510 Atherosclerotic heart disease of native coronary artery without angina pectoris: Secondary | ICD-10-CM | POA: Diagnosis not present

## 2022-11-18 DIAGNOSIS — K449 Diaphragmatic hernia without obstruction or gangrene: Secondary | ICD-10-CM | POA: Diagnosis not present

## 2022-11-18 DIAGNOSIS — Z794 Long term (current) use of insulin: Secondary | ICD-10-CM

## 2022-11-18 MED ORDER — IOPAMIDOL (ISOVUE-370) INJECTION 76%
500.0000 mL | Freq: Once | INTRAVENOUS | Status: AC | PRN
  Administered 2022-11-18: 75 mL via INTRAVENOUS

## 2022-11-18 NOTE — Telephone Encounter (Signed)
Can you update labs 

## 2022-11-18 NOTE — Telephone Encounter (Signed)
Order updated

## 2022-11-19 DIAGNOSIS — E559 Vitamin D deficiency, unspecified: Secondary | ICD-10-CM | POA: Diagnosis not present

## 2022-11-19 DIAGNOSIS — E66811 Obesity, class 1: Secondary | ICD-10-CM | POA: Diagnosis not present

## 2022-11-19 DIAGNOSIS — E782 Mixed hyperlipidemia: Secondary | ICD-10-CM | POA: Diagnosis not present

## 2022-11-19 DIAGNOSIS — Z794 Long term (current) use of insulin: Secondary | ICD-10-CM | POA: Diagnosis not present

## 2022-11-19 DIAGNOSIS — E1165 Type 2 diabetes mellitus with hyperglycemia: Secondary | ICD-10-CM | POA: Diagnosis not present

## 2022-11-20 LAB — COMPREHENSIVE METABOLIC PANEL
ALT: 17 [IU]/L (ref 0–44)
AST: 20 [IU]/L (ref 0–40)
Albumin: 4.5 g/dL (ref 3.8–4.9)
Alkaline Phosphatase: 110 [IU]/L (ref 44–121)
BUN/Creatinine Ratio: 15 (ref 9–20)
BUN: 11 mg/dL (ref 6–24)
Bilirubin Total: 0.3 mg/dL (ref 0.0–1.2)
CO2: 23 mmol/L (ref 20–29)
Calcium: 9 mg/dL (ref 8.7–10.2)
Chloride: 106 mmol/L (ref 96–106)
Creatinine, Ser: 0.75 mg/dL — ABNORMAL LOW (ref 0.76–1.27)
Globulin, Total: 2.6 g/dL (ref 1.5–4.5)
Glucose: 85 mg/dL (ref 70–99)
Potassium: 4.4 mmol/L (ref 3.5–5.2)
Sodium: 143 mmol/L (ref 134–144)
Total Protein: 7.1 g/dL (ref 6.0–8.5)
eGFR: 106 mL/min/{1.73_m2} (ref >59)

## 2022-11-20 LAB — LIPID PANEL
Chol/HDL Ratio: 4.3 ratio (ref 0.0–5.0)
Cholesterol, Total: 111 mg/dL (ref 100–199)
HDL: 26 mg/dL — ABNORMAL LOW (ref >39)
LDL Chol Calc (NIH): 53 mg/dL (ref 0–99)
Triglycerides: 194 mg/dL — ABNORMAL HIGH (ref 0–149)
VLDL Cholesterol Cal: 32 mg/dL (ref 5–40)

## 2022-11-20 LAB — T4, FREE: Free T4: 1.1 ng/dL (ref 0.82–1.77)

## 2022-11-20 LAB — TSH: TSH: 0.981 u[IU]/mL (ref 0.450–4.500)

## 2022-11-20 LAB — VITAMIN D 25 HYDROXY (VIT D DEFICIENCY, FRACTURES): Vit D, 25-Hydroxy: 20.4 ng/mL — ABNORMAL LOW (ref 30.0–100.0)

## 2022-11-23 ENCOUNTER — Encounter: Payer: Self-pay | Admitting: Nurse Practitioner

## 2022-11-23 ENCOUNTER — Ambulatory Visit (INDEPENDENT_AMBULATORY_CARE_PROVIDER_SITE_OTHER): Payer: BC Managed Care – PPO | Admitting: Nurse Practitioner

## 2022-11-23 ENCOUNTER — Other Ambulatory Visit: Payer: Self-pay | Admitting: Nurse Practitioner

## 2022-11-23 VITALS — BP 120/82 | HR 85 | Ht 70.0 in | Wt 249.4 lb

## 2022-11-23 DIAGNOSIS — Z7984 Long term (current) use of oral hypoglycemic drugs: Secondary | ICD-10-CM

## 2022-11-23 DIAGNOSIS — E1165 Type 2 diabetes mellitus with hyperglycemia: Secondary | ICD-10-CM

## 2022-11-23 DIAGNOSIS — Z794 Long term (current) use of insulin: Secondary | ICD-10-CM

## 2022-11-23 DIAGNOSIS — I10 Essential (primary) hypertension: Secondary | ICD-10-CM

## 2022-11-23 DIAGNOSIS — E559 Vitamin D deficiency, unspecified: Secondary | ICD-10-CM

## 2022-11-23 DIAGNOSIS — E782 Mixed hyperlipidemia: Secondary | ICD-10-CM | POA: Diagnosis not present

## 2022-11-23 LAB — POCT GLYCOSYLATED HEMOGLOBIN (HGB A1C): Hemoglobin A1C: 11.2 % — AB (ref 4.0–5.6)

## 2022-11-23 MED ORDER — CEQUR SIMPLICITY 2U DEVI: 6 refills | Status: DC

## 2022-11-23 MED ORDER — GLIPIZIDE ER 5 MG PO TB24: 5.0000 mg | ORAL_TABLET | Freq: Every day | ORAL | 3 refills | Status: DC

## 2022-11-23 MED ORDER — CEQUR SIMPLICITY INSERTER MISC: 0 refills | Status: AC

## 2022-11-23 MED ORDER — METFORMIN HCL 1000 MG PO TABS: 1000.0000 mg | ORAL_TABLET | Freq: Two times a day (BID) | ORAL | 3 refills | Status: DC

## 2022-11-23 MED ORDER — VITAMIN D (ERGOCALCIFEROL) 1.25 MG (50000 UNIT) PO CAPS: 50000.0000 [IU] | ORAL_CAPSULE | ORAL | 1 refills | Status: AC

## 2022-11-23 MED ORDER — LEVEMIR FLEXPEN 100 UNIT/ML ~~LOC~~ SOPN: 50.0000 [IU] | PEN_INJECTOR | Freq: Every day | SUBCUTANEOUS | 3 refills | Status: DC

## 2022-11-23 NOTE — Progress Notes (Signed)
Endocrinology Follow Up Note       11/23/2022, 11:33 AM   Subjective:    Patient ID: Chad Haynes, male    DOB: 05-18-1965.  Chad Haynes is being seen in follow up after being seen in consultation for management of currently uncontrolled symptomatic diabetes requested by  Center, [facility].   Past Medical History:  Diagnosis Date   Depression    Diabetes mellitus without complication (HCC)    Diabetes mellitus, type II (HCC)    GERD (gastroesophageal reflux disease)    H. pylori infection    HTN (hypertension)    Hyperlipidemia    Hypogonadism male    Leg pain    Low magnesium levels    Neck pain    Rhinitis, allergic    Vitamin B12 deficiency    Vitamin D deficiency    White coat hypertension     Past Surgical History:  Procedure Laterality Date   KNEE ARTHROSCOPY Right     Social History   Socioeconomic History   Marital status: Married    Spouse name: Not on file   Number of children: Not on file   Years of education: Not on file   Highest education level: Not on file  Occupational History   Not on file  Tobacco Use   Smoking status: Former    Current packs/day: 0.00    Types: Cigarettes    Quit date: 06/08/1991    Years since quitting: 31.4   Smokeless tobacco: Never  Vaping Use   Vaping status: Never Used  Substance and Sexual Activity   Alcohol use: Yes    Alcohol/week: 0.0 standard drinks of alcohol    Comment: occasionally   Drug use: No   Sexual activity: Yes  Other Topics Concern   Not on file  Social History Narrative   Married. No children. Does not formal exercise.   In school at [employer] full-time. Works part-time job at night.   "Has to walk a mile from the car to classic [employer]"   Social Determinants of Health   Financial Resource Strain: Not on file  Food Insecurity: Not on file  Transportation Needs: Not on file  Physical Activity: Not on file   Stress: Not on file  Social Connections: Not on file    Family History  Problem Relation Age of Onset   COPD Mother        smoker   Heart attack Father    Hypertension Father     Outpatient Encounter Medications as of 11/23/2022  Medication Sig   Continuous Blood Gluc Sensor (FREESTYLE LIBRE 3 SENSOR) MISC Place 1 sensor on the skin every 14 days. Use to check glucose continuously   injection device for insulin (CEQUR SIMPLICITY 2U) DEVI Use to inject insulin TID with meals   Injection Device for Insulin (CEQUR SIMPLICITY INSERTER) MISC Use to apply Cequr device.   insulin aspart (NOVOLOG FLEXPEN) 100 UNIT/ML FlexPen Inject 10-16 Units into the skin 3 (three) times daily with meals.   Insulin Pen Needle (BD PEN NEEDLE NANO 2ND GEN) 32G X 4 MM MISC Use to inject insulin 4 times daily   Insulin Syringe-Needle U-100 (  BD INSULIN SYRINGE U/F) 31G X 5/16" 0.3 ML MISC USE WITH SLIDING SCALE INSULIN   lisinopril (ZESTRIL) 10 MG tablet Take 1 tablet (10 mg total) by mouth daily.   metoprolol succinate (TOPROL-XL) 25 MG 24 hr tablet Take 1 tablet (25 mg total) by mouth daily.   omeprazole (PRILOSEC OTC) 20 MG tablet Take 20 mg by mouth daily.   PARoxetine (PAXIL) 20 MG tablet Take 1 tablet (20 mg total) by mouth daily.   rosuvastatin (CRESTOR) 40 MG tablet Take 1 tablet (40 mg total) by mouth daily.   Vitamin D, Ergocalciferol, (DRISDOL) 1.25 MG (50000 UNIT) CAPS capsule Take 1 capsule (50,000 Units total) by mouth every 7 (seven) days.   [DISCONTINUED] glipiZIDE (GLUCOTROL XL) 5 MG 24 hr tablet Take 1 tablet (5 mg total) by mouth daily with breakfast.   [DISCONTINUED] insulin detemir (LEVEMIR FLEXPEN) 100 UNIT/ML FlexPen Inject 50 Units into the skin at bedtime.   [DISCONTINUED] metFORMIN (GLUCOPHAGE) 1000 MG tablet Take 1,000 mg by mouth 2 (two) times daily.   glipiZIDE (GLUCOTROL XL) 5 MG 24 hr tablet Take 1 tablet (5 mg total) by mouth daily with breakfast.   insulin detemir (LEVEMIR  FLEXPEN) 100 UNIT/ML FlexPen Inject 50 Units into the skin at bedtime.   metFORMIN (GLUCOPHAGE) 1000 MG tablet Take 1 tablet (1,000 mg total) by mouth 2 (two) times daily with a meal.   No facility-administered encounter medications on file as of 11/23/2022.    ALLERGIES: No Known Allergies  VACCINATION STATUS: Immunization History  Administered Date(s) Administered   PFIZER(Purple Top)SARS-COV-2 Vaccination 04/13/2019, 05/13/2019, 12/17/2019   Pneumococcal Polysaccharide-23 01/17/2014   Tdap 05/07/2016    Diabetes He presents for his follow-up diabetic visit. He has type 2 diabetes mellitus. Onset time: was diagnosed at approx age of 34. His disease course has been stable. There are no hypoglycemic associated symptoms. Associated symptoms include blurred vision, fatigue, polydipsia and polyuria. Pertinent negatives for diabetes include no weight loss. Symptoms are stable. Diabetic complications include impotence. 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 Current diabetic treatment includes intensive insulin program and oral agent (dual therapy) (and Ozempic). He is compliant with treatment some of the time (has trouble remembering his insulin at times). His weight is fluctuating minimally. He is following a vegetarian diet. When asked about meal planning, he reported none. He has had a previous visit with a dietitian (Did not find it beneficial). He rarely participates in exercise. His overall blood glucose range is >200 mg/dl. (He presents today with his CGM showing above target glycemic profile overall.  His POCT A1c today is 11.2%, increasing from last visit of 9.3%.  He admits he still misses opportunities to inject his meal time insulin.  He also has not been able to afford a GLP 1  product.  He did not fill out application for patient assistance as he does not feel he would qualify with his current income.  Analysis of his CGM shows TIR 9%, TAR 91%, TBR 0% with a GMI of 10.1%.) An ACE inhibitor/angiotensin II receptor blocker is being taken. He does not see a podiatrist.Eye exam is current.  Hyperlipidemia This is a chronic problem. The current episode started more than 1 year ago. The problem is controlled. Recent lipid tests were reviewed and are normal. Exacerbating diseases include diabetes and obesity. Factors aggravating his hyperlipidemia include  fatty foods. Current antihyperlipidemic treatment includes statins. The current treatment provides moderate improvement of lipids. Compliance problems include adherence to diet and adherence to exercise.  Risk factors for coronary artery disease include diabetes mellitus, dyslipidemia, hypertension, male sex, obesity and a sedentary lifestyle.  Hypertension This is a chronic problem. The current episode started more than 1 year ago. The problem has been gradually improving since onset. The problem is controlled. Associated symptoms include blurred vision. There are no associated agents to hypertension. Risk factors for coronary artery disease include diabetes mellitus, dyslipidemia, family history, male gender, obesity and sedentary lifestyle. Past treatments include ACE inhibitors. The current treatment provides mild improvement. Compliance problems include diet and exercise.      Review of systems  Constitutional: +minimally fluctuating body weight,  current Body mass index is 35.79 kg/m. , no fatigue, no subjective hyperthermia, no subjective hypothermia Eyes: no blurry vision, no xerophthalmia ENT: no sore throat, no nodules palpated in throat, no dysphagia/odynophagia, no hoarseness Cardiovascular: no chest pain, no shortness of breath, no palpitations, no leg swelling Respiratory: no cough, no shortness of  breath Gastrointestinal: no nausea/vomiting/diarrhea Musculoskeletal: no muscle/joint aches Skin: no rashes, no hyperemia Neurological: no tremors, no numbness, no tingling, no dizziness Psychiatric: no depression, no anxiety  Objective:     BP 120/82 (BP Location: Right Arm, Patient Position: Sitting, Cuff Size: Large)   Pulse 85   Ht 5\' 10"  (1.778 m)   Wt 249 lb 6.4 oz (113.1 kg)   BMI 35.79 kg/m   Wt Readings from Last 3 Encounters:  11/23/22 249 lb 6.4 oz (113.1 kg)  11/03/22 251 lb 3.2 oz (113.9 kg)  04/17/22 245 lb 12.8 oz (111.5 kg)     BP Readings from Last 3 Encounters:  11/23/22 120/82  11/03/22 124/78  04/17/22 128/84       Physical Exam- Limited  Constitutional:  Body mass index is 35.79 kg/m. , not in acute distress, normal state of mind Eyes:  EOMI, no exophthalmos Musculoskeletal: no gross deformities, strength intact in all four extremities, no gross restriction of joint movements Skin:  no rashes, no hyperemia Neurological: no tremor with outstretched hands   Diabetic Foot Exam - Simple   No data filed     CMP ( most recent) CMP     Component Value Date/Time   NA 143 11/19/2022 0940   K 4.4 11/19/2022 0940   CL 106 11/19/2022 0940   CO2 23 11/19/2022 0940   GLUCOSE 85 11/19/2022 0940   GLUCOSE 243 (H) 03/25/2021 0111   BUN 11 11/19/2022 0940   CREATININE 0.75 (L) 11/19/2022 0940   CREATININE 0.81 03/15/2020 1159   CALCIUM 9.0 11/19/2022 0940   PROT 7.1 11/19/2022 0940   ALBUMIN 4.5 11/19/2022 0940   AST 20 11/19/2022 0940   ALT 17 11/19/2022 0940   ALKPHOS 110 11/19/2022 0940   BILITOT 0.3 11/19/2022 0940   GFRNONAA >60 03/25/2021 0111   GFRNONAA 75 02/27/2019 1602   GFRAA >60 02/28/2019 0927   GFRAA 87 02/27/2019 1602     Diabetic Labs (most recent): Lab Results  Component Value Date   HGBA1C 11.2 (A) 11/23/2022   HGBA1C 9.3 (A) 10/16/2021   HGBA1C 9.0 07/16/2021   MICROALBUR 80 02/25/2021   MICROALBUR 0.5 07/13/2019    MICROALBUR 0.5 03/10/2018     Lipid Panel ( most recent) Lipid Panel     Component Value Date/Time   CHOL 111 11/19/2022 0940   TRIG 194 (H) 11/19/2022 0940  HDL 26 (L) 11/19/2022 0940   CHOLHDL 4.3 11/19/2022 0940   CHOLHDL 7.6 (H) 02/27/2019 1602   VLDL 38 (H) 05/07/2016 0940   LDLCALC 53 11/19/2022 0940   LDLCALC  02/27/2019 1602     Comment:     . LDL cholesterol not calculated. Triglyceride levels greater than 400 mg/dL invalidate calculated LDL results. . Reference range: <100 . Desirable range <100 mg/dL for primary prevention;   <70 mg/dL for patients with CHD or diabetic patients  with > or = 2 CHD risk factors. Marland Kitchen LDL-C is now calculated using the Martin-Hopkins  calculation, which is a validated novel method providing  better accuracy than the Friedewald equation in the  estimation of LDL-C.  Horald Pollen et al. Lenox Ahr. 4696;295(28): 2061-2068  (http://education.[employer].com/faq/FAQ164)    LDLDIRECT 68 03/15/2020 1159   LABVLDL 32 11/19/2022 0940      Lab Results  Component Value Date   TSH 0.981 11/19/2022   TSH 0.92 03/10/2018   TSH 0.69 05/07/2016   FREET4 1.10 11/19/2022           Assessment & Plan:   1) Uncontrolled type 2 diabetes mellitus with hyperglycemia (HCC)  - Chad Haynes has currently uncontrolled symptomatic type 2 DM since 57 years of age.Marland Kitchen   He presents today with his CGM showing above target glycemic profile overall.  His POCT A1c today is 11.2%, increasing from last visit of 9.3%.  He admits he still misses opportunities to inject his meal time insulin.  He also has not been able to afford a GLP 1 product.  He did not fill out application for patient assistance as he does not feel he would qualify with his current income.  Analysis of his CGM shows TIR 9%, TAR 91%, TBR 0% with a GMI of 10.1%.  -Recent labs reviewed.   - I had a long discussion with him about the progressive nature of diabetes and the pathology behind  its complications. -his diabetes is not currently complicated but he remains at a high risk for more acute and chronic complications which include CAD, CVA, CKD, retinopathy, and neuropathy. These are all discussed in detail with him.  - Nutritional counseling repeated at each appointment due to patients tendency to fall back in to old habits.  - The patient admits there is a room for improvement in their diet and drink choices. -  Suggestion is made for the patient to avoid simple carbohydrates from their diet including Cakes, Sweet Desserts / Pastries, Ice Cream, Soda (diet and regular), Sweet Tea, Candies, Chips, Cookies, Sweet Pastries, Store Bought Juices, Alcohol in Excess of 1-2 drinks a day, Artificial Sweeteners, Coffee Creamer, and "Sugar-free" Products. This will help patient to have stable blood glucose profile and potentially avoid unintended weight gain.   - I encouraged the patient to switch to unprocessed or minimally processed complex starch and increased protein intake (animal or plant source), fruits, and vegetables.   - Patient is advised to stick to a routine mealtimes to eat 3 meals a day and avoid unnecessary snacks (to snack only to correct hypoglycemia).  - I have approached him with the following individualized plan to manage  his diabetes and patient agrees:   -He is advised to continue Levemir (or similar long-acting insulin) 50 units SQ daily at bedtime, Glipizide 5 mg XL daily with breakfast, and Metformin 1000 mg po twice daily with meals.  He struggles with his meal time injections.  I discussed and initiated CeQur device  to help with compliance of insulin injections.  He is advised to inject 4 units (2 clicks with snacks), 10 units (5 clicks with small/medium sized meals), and 14 units (7 clicks) with larger meals.     -he is encouraged to use his CGM to continue monitoring blood glucose 4 times daily-top be more consistent with scanning (I sent in for Libre 3 to his  pharmacy), before meals and before bed, and to call the clinic if he has readings less than 70 or greater than 300 for 3 tests in a row.    - he is warned not to take insulin without proper monitoring per orders. - Adjustment parameters are given to him for hypo and hyperglycemia in writing.  - he will be considered for incretin therapy as appropriate next visit.  He could not afford the copay for GLP1 product.  - Specific targets for  A1c;  LDL, HDL,  and Triglycerides were discussed with the patient.  2) Blood Pressure /Hypertension:  his blood pressure is controlled to target. There is documentation of white coat syndrome.  he is advised to continue his current medications including Lisinopril 10 mg p.o. daily with breakfast.  3) Lipids/Hyperlipidemia:    Review of his recent lipid panel from 04/08/21 showed controlled LDL at 48 .  he  is advised to continue Simvastatin 80 mg daily at bedtime.  Side effects and precautions discussed with him.    4)  Weight/Diet:  his Body mass index is 35.79 kg/m.  -  clearly complicating his diabetes care.   he is candidate for weight loss. I discussed with him the fact that loss of 5 - 10% of his  current body weight will have the most impact on his diabetes management.  Exercise, and detailed carbohydrates information provided  -  detailed on discharge instructions.  5) Chronic Care/Health Maintenance: -he is on ACEI/ARB and Statin medications and is encouraged to initiate and continue to follow up with Ophthalmology, Dentist, Podiatrist at least yearly or according to recommendations, and advised to stay away from smoking. I have recommended yearly flu vaccine and pneumonia vaccine at least every 5 years; moderate intensity exercise for up to 150 minutes weekly; and sleep for at least 7 hours a day.  - he is advised to maintain close follow up with Center, [facility] for primary care needs, as well as his other providers for optimal and coordinated  care.     I spent  40  minutes in the care of the patient today including review of labs from CMP, Lipids, Thyroid Function, Hematology (current and previous including abstractions from other facilities); face-to-face time discussing  his blood glucose readings/logs, discussing hypoglycemia and hyperglycemia episodes and symptoms, medications doses, his options of short and long term treatment based on the latest standards of care / guidelines;  discussion about incorporating lifestyle medicine;  and documenting the encounter. Risk reduction counseling performed per USPSTF guidelines to reduce obesity and cardiovascular risk factors.     Please refer to Patient Instructions for Blood Glucose Monitoring and Insulin/Medications Dosing Guide"  in media tab for additional information. Please  also refer to " Patient Self Inventory" in the Media  tab for reviewed elements of pertinent patient history.  Chad Haynes participated in the discussions, expressed understanding, and voiced agreement with the above plans.  All questions were answered to his satisfaction. he is encouraged to contact clinic should he have any questions or concerns prior to his return visit.  Follow up plan: - Return in about 3 months (around 02/23/2023) for Diabetes F/U with A1c in office, No previsit labs, Bring meter and logs.   Ronny Bacon, [facility] [address] [place], [place] [address] Phone: [phone] Fax: [phone]  11/23/2022, 11:33 AM

## 2022-11-24 ENCOUNTER — Other Ambulatory Visit: Payer: Self-pay | Admitting: Nurse Practitioner

## 2022-11-24 MED ORDER — INSULIN ASPART 100 UNIT/ML IJ SOLN: INTRAMUSCULAR | 3 refills | Status: DC

## 2022-11-25 ENCOUNTER — Other Ambulatory Visit: Payer: Self-pay | Admitting: *Deleted

## 2022-12-02 ENCOUNTER — Other Ambulatory Visit: Payer: Self-pay | Admitting: Nurse Practitioner

## 2022-12-02 MED ORDER — LEVEMIR FLEXPEN 100 UNIT/ML ~~LOC~~ SOPN: 50.0000 [IU] | PEN_INJECTOR | Freq: Every day | SUBCUTANEOUS | 3 refills | Status: DC

## 2022-12-08 ENCOUNTER — Other Ambulatory Visit: Payer: Self-pay

## 2022-12-08 MED ORDER — GLIPIZIDE ER 5 MG PO TB24: 5.0000 mg | ORAL_TABLET | Freq: Every day | ORAL | 3 refills | Status: DC

## 2022-12-08 MED ORDER — FREESTYLE LIBRE 3 SENSOR MISC: 2 refills | Status: DC

## 2022-12-14 ENCOUNTER — Other Ambulatory Visit: Payer: Self-pay | Admitting: *Deleted

## 2022-12-14 ENCOUNTER — Telehealth: Payer: Self-pay | Admitting: Nurse Practitioner

## 2022-12-14 DIAGNOSIS — Z794 Long term (current) use of insulin: Secondary | ICD-10-CM

## 2022-12-14 DIAGNOSIS — Z7984 Long term (current) use of oral hypoglycemic drugs: Secondary | ICD-10-CM

## 2022-12-14 DIAGNOSIS — E1165 Type 2 diabetes mellitus with hyperglycemia: Secondary | ICD-10-CM

## 2022-12-14 MED ORDER — INSULIN GLARGINE 100 UNIT/ML SOLOSTAR PEN: 50.0000 [IU] | PEN_INJECTOR | Freq: Every day | SUBCUTANEOUS | 0 refills | Status: DC

## 2022-12-14 NOTE — Telephone Encounter (Signed)
A prescription for the Lantus was sent in for the patient.

## 2022-12-14 NOTE — Telephone Encounter (Signed)
Pt states that Levemir is no more and is in need of a refill.   [facility] on [address]

## 2022-12-22 ENCOUNTER — Other Ambulatory Visit: Payer: Self-pay

## 2022-12-22 MED ORDER — GLIPIZIDE ER 5 MG PO TB24: 5.0000 mg | ORAL_TABLET | Freq: Every day | ORAL | 0 refills | Status: DC

## 2022-12-22 MED ORDER — TRESIBA FLEXTOUCH 100 UNIT/ML ~~LOC~~ SOPN: 50.0000 [IU] | PEN_INJECTOR | Freq: Every day | SUBCUTANEOUS | 0 refills | Status: DC

## 2022-12-23 DIAGNOSIS — Z1211 Encounter for screening for malignant neoplasm of colon: Secondary | ICD-10-CM | POA: Diagnosis not present

## 2023-01-01 ENCOUNTER — Other Ambulatory Visit: Payer: Self-pay

## 2023-01-01 ENCOUNTER — Telehealth: Payer: Self-pay | Admitting: Internal Medicine

## 2023-01-01 ENCOUNTER — Telehealth: Payer: Self-pay | Admitting: Cardiology

## 2023-01-01 ENCOUNTER — Other Ambulatory Visit (HOSPITAL_BASED_OUTPATIENT_CLINIC_OR_DEPARTMENT_OTHER): Payer: Self-pay | Admitting: Family

## 2023-01-01 DIAGNOSIS — I25118 Atherosclerotic heart disease of native coronary artery with other forms of angina pectoris: Secondary | ICD-10-CM

## 2023-01-01 MED ORDER — METOPROLOL SUCCINATE ER 25 MG PO TB24: 25.0000 mg | ORAL_TABLET | Freq: Every day | ORAL | 2 refills | Status: AC

## 2023-01-01 NOTE — Telephone Encounter (Signed)
About Ct results. Please advise

## 2023-01-01 NOTE — Telephone Encounter (Signed)
*  STAT* If patient is at the pharmacy, call can be transferred to refill team.   1. Which medications need to be refilled? (please list name of each medication and dose if known) metoprolol succinate (TOPROL-XL) 25 MG 24 hr tablet  2. Which pharmacy/location (including street and city if local pharmacy) is medication to be sent to? [id number] [place], [place] - [address]  3. Do they need a 30 day or 90 day supply? [place]

## 2023-01-01 NOTE — Telephone Encounter (Signed)
Sharlene Dory, PA-C 12/02/2022 12:07 PM EST     Mr. Laws, Your ascending aortic aneurysm was stable at 4 x 4.3 cm.  Recommended continuing imaging yearly.  A reminder to keep your blood pressure well-controlled.  I did mention that you have mild scattered three-vessel coronary disease, small hiatal hernia, and moderate fatty liver.   Triage, can you send a copy of the scan to his primary care.  Thanks.   Sharlene Dory, PA-C   Left message for patient to call back

## 2023-01-11 NOTE — Telephone Encounter (Signed)
Lm to call back ./cy 

## 2023-01-15 NOTE — Telephone Encounter (Signed)
 Left detailed message per DPR. Advised to call back if questions.

## 2023-01-20 DIAGNOSIS — M9903 Segmental and somatic dysfunction of lumbar region: Secondary | ICD-10-CM | POA: Diagnosis not present

## 2023-01-20 DIAGNOSIS — M545 Low back pain, unspecified: Secondary | ICD-10-CM | POA: Diagnosis not present

## 2023-01-20 DIAGNOSIS — M62830 Muscle spasm of back: Secondary | ICD-10-CM | POA: Diagnosis not present

## 2023-01-20 DIAGNOSIS — M9901 Segmental and somatic dysfunction of cervical region: Secondary | ICD-10-CM | POA: Diagnosis not present

## 2023-01-22 ENCOUNTER — Other Ambulatory Visit: Payer: Self-pay | Admitting: Nurse Practitioner

## 2023-01-22 MED ORDER — CEQUR SIMPLICITY 2U DEVI: 6 refills | Status: DC

## 2023-01-25 DIAGNOSIS — M9901 Segmental and somatic dysfunction of cervical region: Secondary | ICD-10-CM | POA: Diagnosis not present

## 2023-01-25 DIAGNOSIS — M62830 Muscle spasm of back: Secondary | ICD-10-CM | POA: Diagnosis not present

## 2023-01-25 DIAGNOSIS — M9903 Segmental and somatic dysfunction of lumbar region: Secondary | ICD-10-CM | POA: Diagnosis not present

## 2023-01-25 DIAGNOSIS — M545 Low back pain, unspecified: Secondary | ICD-10-CM | POA: Diagnosis not present

## 2023-01-27 DIAGNOSIS — M9903 Segmental and somatic dysfunction of lumbar region: Secondary | ICD-10-CM | POA: Diagnosis not present

## 2023-01-27 DIAGNOSIS — M9901 Segmental and somatic dysfunction of cervical region: Secondary | ICD-10-CM | POA: Diagnosis not present

## 2023-01-27 DIAGNOSIS — M545 Low back pain, unspecified: Secondary | ICD-10-CM | POA: Diagnosis not present

## 2023-01-27 DIAGNOSIS — M62830 Muscle spasm of back: Secondary | ICD-10-CM | POA: Diagnosis not present

## 2023-01-28 DIAGNOSIS — M545 Low back pain, unspecified: Secondary | ICD-10-CM | POA: Diagnosis not present

## 2023-01-28 DIAGNOSIS — M9903 Segmental and somatic dysfunction of lumbar region: Secondary | ICD-10-CM | POA: Diagnosis not present

## 2023-01-28 DIAGNOSIS — M9901 Segmental and somatic dysfunction of cervical region: Secondary | ICD-10-CM | POA: Diagnosis not present

## 2023-01-28 DIAGNOSIS — M62830 Muscle spasm of back: Secondary | ICD-10-CM | POA: Diagnosis not present

## 2023-02-01 DIAGNOSIS — M545 Low back pain, unspecified: Secondary | ICD-10-CM | POA: Diagnosis not present

## 2023-02-01 DIAGNOSIS — M9901 Segmental and somatic dysfunction of cervical region: Secondary | ICD-10-CM | POA: Diagnosis not present

## 2023-02-01 DIAGNOSIS — M9903 Segmental and somatic dysfunction of lumbar region: Secondary | ICD-10-CM | POA: Diagnosis not present

## 2023-02-01 DIAGNOSIS — M62830 Muscle spasm of back: Secondary | ICD-10-CM | POA: Diagnosis not present

## 2023-02-03 DIAGNOSIS — M545 Low back pain, unspecified: Secondary | ICD-10-CM | POA: Diagnosis not present

## 2023-02-03 DIAGNOSIS — M9903 Segmental and somatic dysfunction of lumbar region: Secondary | ICD-10-CM | POA: Diagnosis not present

## 2023-02-03 DIAGNOSIS — M9901 Segmental and somatic dysfunction of cervical region: Secondary | ICD-10-CM | POA: Diagnosis not present

## 2023-02-03 DIAGNOSIS — M62830 Muscle spasm of back: Secondary | ICD-10-CM | POA: Diagnosis not present

## 2023-02-04 DIAGNOSIS — M62830 Muscle spasm of back: Secondary | ICD-10-CM | POA: Diagnosis not present

## 2023-02-04 DIAGNOSIS — M545 Low back pain, unspecified: Secondary | ICD-10-CM | POA: Diagnosis not present

## 2023-02-04 DIAGNOSIS — M9901 Segmental and somatic dysfunction of cervical region: Secondary | ICD-10-CM | POA: Diagnosis not present

## 2023-02-04 DIAGNOSIS — M9903 Segmental and somatic dysfunction of lumbar region: Secondary | ICD-10-CM | POA: Diagnosis not present

## 2023-02-08 DIAGNOSIS — M9903 Segmental and somatic dysfunction of lumbar region: Secondary | ICD-10-CM | POA: Diagnosis not present

## 2023-02-08 DIAGNOSIS — M62830 Muscle spasm of back: Secondary | ICD-10-CM | POA: Diagnosis not present

## 2023-02-08 DIAGNOSIS — M9901 Segmental and somatic dysfunction of cervical region: Secondary | ICD-10-CM | POA: Diagnosis not present

## 2023-02-08 DIAGNOSIS — M545 Low back pain, unspecified: Secondary | ICD-10-CM | POA: Diagnosis not present

## 2023-02-10 DIAGNOSIS — M9903 Segmental and somatic dysfunction of lumbar region: Secondary | ICD-10-CM | POA: Diagnosis not present

## 2023-02-10 DIAGNOSIS — M62830 Muscle spasm of back: Secondary | ICD-10-CM | POA: Diagnosis not present

## 2023-02-10 DIAGNOSIS — M545 Low back pain, unspecified: Secondary | ICD-10-CM | POA: Diagnosis not present

## 2023-02-10 DIAGNOSIS — M9901 Segmental and somatic dysfunction of cervical region: Secondary | ICD-10-CM | POA: Diagnosis not present

## 2023-02-11 DIAGNOSIS — M62830 Muscle spasm of back: Secondary | ICD-10-CM | POA: Diagnosis not present

## 2023-02-11 DIAGNOSIS — M9901 Segmental and somatic dysfunction of cervical region: Secondary | ICD-10-CM | POA: Diagnosis not present

## 2023-02-11 DIAGNOSIS — M545 Low back pain, unspecified: Secondary | ICD-10-CM | POA: Diagnosis not present

## 2023-02-11 DIAGNOSIS — M62838 Other muscle spasm: Secondary | ICD-10-CM | POA: Diagnosis not present

## 2023-02-11 DIAGNOSIS — M9903 Segmental and somatic dysfunction of lumbar region: Secondary | ICD-10-CM | POA: Diagnosis not present

## 2023-02-16 ENCOUNTER — Other Ambulatory Visit: Payer: Self-pay | Admitting: Nurse Practitioner

## 2023-02-16 MED ORDER — CEQUR SIMPLICITY 2U DEVI: 6 refills | Status: DC

## 2023-02-17 NOTE — Telephone Encounter (Signed)
 Chad Haynes, the drug rep told me to send his script here due to him changing insurances.  We need to try and get it pushed through.

## 2023-02-23 ENCOUNTER — Telehealth: Payer: Self-pay | Admitting: Pharmacy Technician

## 2023-02-23 ENCOUNTER — Ambulatory Visit: Payer: BC Managed Care – PPO | Admitting: Nurse Practitioner

## 2023-02-23 ENCOUNTER — Other Ambulatory Visit (HOSPITAL_COMMUNITY): Payer: Self-pay

## 2023-02-23 DIAGNOSIS — I10 Essential (primary) hypertension: Secondary | ICD-10-CM

## 2023-02-23 DIAGNOSIS — Z794 Long term (current) use of insulin: Secondary | ICD-10-CM

## 2023-02-23 DIAGNOSIS — E559 Vitamin D deficiency, unspecified: Secondary | ICD-10-CM

## 2023-02-23 DIAGNOSIS — E782 Mixed hyperlipidemia: Secondary | ICD-10-CM

## 2023-02-23 DIAGNOSIS — Z7984 Long term (current) use of oral hypoglycemic drugs: Secondary | ICD-10-CM

## 2023-02-23 NOTE — Telephone Encounter (Signed)
Pharmacy Patient Advocate Encounter   Received notification from RX Request Messages that prior authorization for Cequr is required/requested.   Insurance verification completed.   The patient is insured through [employer]  .   Per test claim: PA required; PA started via CoverMyMeds. KEY [id number] . Waiting for clinical questions to populate.

## 2023-02-23 NOTE — Telephone Encounter (Signed)
Ok, thanks for the heads up.  I thought the denial reason was a little flaky.

## 2023-02-23 NOTE — Telephone Encounter (Signed)
Patient was called and a message was left sharing Whitney's recommendations.

## 2023-02-23 NOTE — Telephone Encounter (Signed)
I guess we just need to call him and let him know his insurance denied it.  Their reasoning does not make sense to me.  I recommend he call his insurance company to learn more about why and if there are any other devices that they DO cover.  He can also request an appeal to this decision if he thinks it is worth it.

## 2023-02-23 NOTE — Telephone Encounter (Signed)
Pharmacy Patient Advocate Encounter  Received notification from [facility] that Prior Authorization for CeQur Simplicity 2U has been DENIED.  Full denial letter will be uploaded to the media tab. See denial reason below.   This health benefit plan does not cover the following services, supplies, drugs or charges: Vitamins, food supplements or replacements, nutritional or dietary supplements, formulas or special foods of any kind, including medical foods with a prescription (except for prescription prenatal vitamins or prescription vitamin B-12 injections for anemias, neuropathies or dementias secondary to a vitamin B-12 deficiency, or certain over-the-counter medications that may be available under your preventive care benefits for certain individuals).  PA #/Case ID/Reference #: [id number]

## 2023-02-23 NOTE — Telephone Encounter (Signed)
PA request has been Started. New Encounter created for follow up. For additional info see Pharmacy Prior Auth telephone encounter from 02/23/23. However, it looks like his plan may not cover this through his pharmacy benefit. It might have to go through medical. Just based on the test claim rejection wording. However, we are submitting the request to see.

## 2023-02-23 NOTE — Telephone Encounter (Signed)
Clinical questions have been answered and PA submitted. PA currently Pending.

## 2023-03-08 ENCOUNTER — Other Ambulatory Visit: Payer: Self-pay | Admitting: Nurse Practitioner

## 2023-03-21 ENCOUNTER — Other Ambulatory Visit: Payer: Self-pay | Admitting: Nurse Practitioner

## 2023-04-13 ENCOUNTER — Other Ambulatory Visit (HOSPITAL_COMMUNITY): Payer: Self-pay

## 2023-04-17 ENCOUNTER — Other Ambulatory Visit: Payer: Self-pay | Admitting: Nurse Practitioner

## 2023-04-17 DIAGNOSIS — E1165 Type 2 diabetes mellitus with hyperglycemia: Secondary | ICD-10-CM

## 2023-05-14 ENCOUNTER — Other Ambulatory Visit: Payer: Self-pay | Admitting: Nurse Practitioner

## 2023-05-14 ENCOUNTER — Encounter: Payer: Self-pay | Admitting: Nurse Practitioner

## 2023-05-14 ENCOUNTER — Ambulatory Visit (INDEPENDENT_AMBULATORY_CARE_PROVIDER_SITE_OTHER): Payer: BC Managed Care – PPO | Admitting: Nurse Practitioner

## 2023-05-14 VITALS — BP 120/82 | HR 89 | Ht 70.0 in | Wt 249.2 lb

## 2023-05-14 DIAGNOSIS — I10 Essential (primary) hypertension: Secondary | ICD-10-CM

## 2023-05-14 DIAGNOSIS — E782 Mixed hyperlipidemia: Secondary | ICD-10-CM

## 2023-05-14 DIAGNOSIS — E1165 Type 2 diabetes mellitus with hyperglycemia: Secondary | ICD-10-CM | POA: Diagnosis not present

## 2023-05-14 DIAGNOSIS — Z794 Long term (current) use of insulin: Secondary | ICD-10-CM

## 2023-05-14 DIAGNOSIS — E559 Vitamin D deficiency, unspecified: Secondary | ICD-10-CM

## 2023-05-14 DIAGNOSIS — Z7984 Long term (current) use of oral hypoglycemic drugs: Secondary | ICD-10-CM

## 2023-05-14 MED ORDER — CEQUR SIMPLICITY 2U DEVI: 6 refills | Status: DC

## 2023-05-14 NOTE — Progress Notes (Signed)
 Endocrinology Follow Up Note       05/14/2023, 10:43 AM   Subjective:    Patient ID: Chad Haynes, male    DOB: Apr 30, 1965.  Chad Haynes is being seen in follow up after being seen in consultation for management of currently uncontrolled symptomatic diabetes requested by  Pcp, No.   Past Medical History:  Diagnosis Date   Depression    Diabetes mellitus without complication (HCC)    Diabetes mellitus, type II (HCC)    GERD (gastroesophageal reflux disease)    H. pylori infection    HTN (hypertension)    Hyperlipidemia    Hypogonadism male    Leg pain    Low magnesium  levels    Neck pain    Rhinitis, allergic    Vitamin B12 deficiency    Vitamin D  deficiency    White coat hypertension     Past Surgical History:  Procedure Laterality Date   KNEE ARTHROSCOPY Right     Social History   Socioeconomic History   Marital status: Married    Spouse name: Not on file   Number of children: Not on file   Years of education: Not on file   Highest education level: Not on file  Occupational History   Not on file  Tobacco Use   Smoking status: Former    Current packs/day: 0.00    Types: Cigarettes    Quit date: 06/08/1991    Years since quitting: 31.9   Smokeless tobacco: Never  Vaping Use   Vaping status: Never Used  Substance and Sexual Activity   Alcohol  use: Yes    Alcohol /week: 0.0 standard drinks of alcohol     Comment: occasionally   Drug use: No   Sexual activity: Yes  Other Topics Concern   Not on file  Social History Narrative   Married. No children. Does not formal exercise.   In school at [employer] full-time. Works part-time job at night.   "Has to walk a mile from the car to classic [employer]"   Social Drivers of Corporate investment banker Strain: Not on file  Food Insecurity: Not on file  Transportation Needs: Not on file  Physical Activity: Not on file  Stress: Not on file   Social Connections: Not on file    Family History  Problem Relation Age of Onset   COPD Mother        smoker   Heart attack Father    Hypertension Father     Outpatient Encounter Medications as of 05/14/2023  Medication Sig   BD PEN NEEDLE NANO 2ND GEN 32G X 4 MM MISC USE TO INJECT INSULIN  4 TIMES DAILY   Continuous Glucose Sensor (FREESTYLE LIBRE 3 SENSOR) MISC PLACE 1 SENSOR ON THE SKIN EVERY 14 DAYS. USE TO CHECK GLUCOSE CONTINUOUSLY   glipiZIDE  (GLUCOTROL  XL) 5 MG 24 hr tablet Take 1 tablet (5 mg total) by mouth daily with breakfast.   Injection Device for Insulin  (CEQUR SIMPLICITY INSERTER) MISC Use to apply Cequr device.   insulin  aspart (NOVOLOG ) 100 UNIT/ML injection Use with Cequr for TDD around 40 units per day   insulin  glargine (LANTUS ) 100 unit/mL SOPN Inject  50 Units into the skin daily.   Insulin  Syringe-Needle U-100 (BD INSULIN  SYRINGE U/F) 31G X 5/16" 0.3 ML MISC USE WITH SLIDING SCALE INSULIN    lisinopril  (ZESTRIL ) 10 MG tablet Take 1 tablet (10 mg total) by mouth daily.   metFORMIN  (GLUCOPHAGE ) 1000 MG tablet Take 1 tablet (1,000 mg total) by mouth 2 (two) times daily with a meal.   metoprolol  succinate (TOPROL -XL) 25 MG 24 hr tablet Take 1 tablet (25 mg total) by mouth daily.   omeprazole  (PRILOSEC OTC) 20 MG tablet Take 20 mg by mouth daily.   PARoxetine  (PAXIL ) 20 MG tablet Take 1 tablet (20 mg total) by mouth daily.   rosuvastatin  (CRESTOR ) 40 MG tablet Take 1 tablet (40 mg total) by mouth daily.   Vitamin D , Ergocalciferol , (DRISDOL ) 1.25 MG (50000 UNIT) CAPS capsule Take 1 capsule (50,000 Units total) by mouth every 7 (seven) days.   [DISCONTINUED] injection device for insulin  (CEQUR SIMPLICITY 2U) DEVI Use to inject insulin  TID with meals   injection device for insulin  (CEQUR SIMPLICITY 2U) DEVI Use to inject insulin  TID with meals, change every 2-3 days   insulin  degludec (TRESIBA  FLEXTOUCH) 100 UNIT/ML FlexTouch Pen INJECT 50 UNITS INTO THE SKIN AT BEDTIME.  (Patient not taking: Reported on 05/14/2023)   No facility-administered encounter medications on file as of 05/14/2023.    ALLERGIES: No Known Allergies  VACCINATION STATUS: Immunization History  Administered Date(s) Administered   PFIZER(Purple Top)SARS-COV-2 Vaccination 04/13/2019, 05/13/2019, 12/17/2019   Pneumococcal Polysaccharide-23 01/17/2014   Tdap 05/07/2016    Diabetes He presents for his follow-up diabetic visit. He has type 2 diabetes mellitus. Onset time: was diagnosed at approx age of 23. His disease course has been fluctuating. There are no hypoglycemic associated symptoms. Associated symptoms include blurred vision, fatigue, polydipsia and polyuria. Pertinent negatives for diabetes include no weight loss. Symptoms are stable. Diabetic complications include impotence. 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 Current diabetic treatment includes intensive insulin  program and oral agent (dual therapy) (and Ozempic ). He is compliant with treatment some of the time (has trouble remembering his insulin  at times). His weight is fluctuating minimally. When asked about meal planning, he reported none. He has had a previous visit with a dietitian (Did not find it beneficial). He rarely participates in exercise. His home blood glucose trend is fluctuating dramatically. His overall blood glucose range is >200 mg/dl. (He presents today with his CGM showing above target glycemic profile overall.  His POCT A1c today is 11.8%, increasing from last visit of 11.2%.  He is using his Cequr and generally likes it, had trouble getting his supply and thus went without prandial insulin  for a bit of time.  He also notes he has only taken the Metformin  once daily instead of BID due to misunderstanding.   Analysis of his CGM shows TIR 9%, TAR 91%, TBR 0% with a GMI of 10.2%. ) An ACE inhibitor/angiotensin II receptor blocker is being taken. He does not see a podiatrist.Eye exam is current.  Hyperlipidemia This is a chronic problem. The current episode started more than 1 year ago. The problem is controlled. Recent lipid tests were reviewed and are normal. Exacerbating diseases include diabetes and obesity. Factors aggravating his hyperlipidemia include fatty foods. Current antihyperlipidemic treatment includes statins. The current treatment provides moderate improvement of lipids. Compliance problems include adherence to diet and  adherence to exercise.  Risk factors for coronary artery disease include diabetes mellitus, dyslipidemia, hypertension, male sex, obesity and a sedentary lifestyle.  Hypertension This is a chronic problem. The current episode started more than 1 year ago. The problem has been gradually improving since onset. The problem is controlled. Associated symptoms include blurred vision. There are no associated agents to hypertension. Risk factors for coronary artery disease include diabetes mellitus, dyslipidemia, family history, male gender, obesity and sedentary lifestyle. Past treatments include ACE inhibitors. The current treatment provides mild improvement. Compliance problems include diet and exercise.      Review of systems  Constitutional: +minimally fluctuating body weight,  current Body mass index is 35.76 kg/m. , no fatigue, no subjective hyperthermia, no subjective hypothermia Eyes: no blurry vision, no xerophthalmia ENT: no sore throat, no nodules palpated in throat, no dysphagia/odynophagia, no hoarseness Cardiovascular: no chest pain, no shortness of breath, no palpitations, no leg swelling Respiratory: no cough, no shortness of breath Gastrointestinal: no nausea/vomiting/diarrhea Musculoskeletal: no muscle/joint aches Skin: no rashes, no hyperemia Neurological: no  tremors, no numbness, no tingling, no dizziness Psychiatric: no depression, no anxiety  Objective:     BP 120/82 (BP Location: Right Arm, Patient Position: Sitting, Cuff Size: Large)   Pulse 89   Ht 5\' 10"  (1.778 m)   Wt 249 lb 3.2 oz (113 kg)   BMI 35.76 kg/m   Wt Readings from Last 3 Encounters:  05/14/23 249 lb 3.2 oz (113 kg)  11/23/22 249 lb 6.4 oz (113.1 kg)  11/03/22 251 lb 3.2 oz (113.9 kg)     BP Readings from Last 3 Encounters:  05/14/23 120/82  11/23/22 120/82  11/03/22 124/78       Physical Exam- Limited  Constitutional:  Body mass index is 35.76 kg/m. , not in acute distress, normal state of mind Eyes:  EOMI, no exophthalmos Musculoskeletal: no gross deformities, strength intact in all four extremities, no gross restriction of joint movements Skin:  no rashes, no hyperemia Neurological: no tremor with outstretched hands   Diabetic Foot Exam - Simple   No data filed     CMP ( most recent) CMP     Component Value Date/Time   NA 143 11/19/2022 0940   K 4.4 11/19/2022 0940   CL 106 11/19/2022 0940   CO2 23 11/19/2022 0940   GLUCOSE 85 11/19/2022 0940   GLUCOSE 243 (H) 03/25/2021 0111   BUN 11 11/19/2022 0940   CREATININE 0.75 (L) 11/19/2022 0940   CREATININE 0.81 03/15/2020 1159   CALCIUM  9.0 11/19/2022 0940   PROT 7.1 11/19/2022 0940   ALBUMIN 4.5 11/19/2022 0940   AST 20 11/19/2022 0940   ALT 17 11/19/2022 0940   ALKPHOS 110 11/19/2022 0940   BILITOT 0.3 11/19/2022 0940   GFRNONAA >60 03/25/2021 0111   GFRNONAA 75 02/27/2019 1602   GFRAA >60 02/28/2019 0927   GFRAA 87 02/27/2019 1602     Diabetic Labs (most recent): Lab Results  Component Value Date   HGBA1C 11.2 (A) 11/23/2022   HGBA1C 9.3 (A) 10/16/2021   HGBA1C 9.0 07/16/2021   MICROALBUR 80 02/25/2021   MICROALBUR 0.5 07/13/2019   MICROALBUR 0.5 03/10/2018     Lipid Panel ( most recent) Lipid Panel     Component Value Date/Time   CHOL 111 11/19/2022 0940   TRIG 194 (H)  11/19/2022 0940   HDL 26 (L) 11/19/2022 0940   CHOLHDL 4.3 11/19/2022 0940   CHOLHDL 7.6 (H) 02/27/2019 1602   VLDL 38 (  H) 05/07/2016 0940   LDLCALC 53 11/19/2022 0940   LDLCALC  02/27/2019 1602     Comment:     . LDL cholesterol not calculated. Triglyceride levels greater than 400 mg/dL invalidate calculated LDL results. . Reference range: <100 . Desirable range <100 mg/dL for primary prevention;   <70 mg/dL for patients with CHD or diabetic patients  with > or = 2 CHD risk factors. Aaron Aas LDL-C is now calculated using the Martin-Hopkins  calculation, which is a validated novel method providing  better accuracy than the Friedewald equation in the  estimation of LDL-C.  Melinda Sprawls et al. Erroll Heard. 6045;409(81): 2061-2068  (http://education.[facility].com/faq/FAQ164)    LDLDIRECT 68 03/15/2020 1159   LABVLDL 32 11/19/2022 0940      Lab Results  Component Value Date   TSH 0.981 11/19/2022   TSH 0.92 03/10/2018   TSH 0.69 05/07/2016   FREET4 1.10 11/19/2022           Assessment & Plan:   1) Uncontrolled type 2 diabetes mellitus with hyperglycemia (HCC)  - Chad Haynes has currently uncontrolled symptomatic type 2 DM since 58 years of age.Aaron Aas   He presents today with his CGM showing above target glycemic profile overall.  His POCT A1c today is 11.8%, increasing from last visit of 11.2%.  He is using his Cequr and generally likes it, had trouble getting his supply and thus went without prandial insulin  for a bit of time.  He also notes he has only taken the Metformin  once daily instead of BID due to misunderstanding.  Analysis of his CGM shows TIR 9%, TAR 91%, TBR 0% with a GMI of 10.2%.   -Recent labs reviewed.   - I had a long discussion with him about the progressive nature of diabetes and the pathology behind its complications. -his diabetes is not currently complicated but he remains at a high risk for more acute and chronic complications which include CAD, CVA,  CKD, retinopathy, and neuropathy. These are all discussed in detail with him.  - Nutritional counseling repeated at each appointment due to patients tendency to fall back in to old habits.  - The patient admits there is a room for improvement in their diet and drink choices. -  Suggestion is made for the patient to avoid simple carbohydrates from their diet including Cakes, Sweet Desserts / Pastries, Ice Cream, Soda (diet and regular), Sweet Tea, Candies, Chips, Cookies, Sweet Pastries, Store Bought Juices, Alcohol  in Excess of 1-2 drinks a day, Artificial Sweeteners, Coffee Creamer, and "Sugar-free" Products. This will help patient to have stable blood glucose profile and potentially avoid unintended weight gain.   - I encouraged the patient to switch to unprocessed or minimally processed complex starch and increased protein intake (animal or plant source), fruits, and vegetables.   - Patient is advised to stick to a routine mealtimes to eat 3 meals a day and avoid unnecessary snacks (to snack only to correct hypoglycemia).  - I have approached him with the following individualized plan to manage  his diabetes and patient agrees:   -He is advised to continue Lantus  50 units SQ daily at bedtime, Glipizide  5 mg XL daily with breakfast, and restart Metformin  1000 mg po twice daily with meals. I increased his Cequr insulin  dosing to 10 units prior to each meal to help prevent prandial spikes.   -he is encouraged to use his CGM to continue monitoring blood glucose 4 times daily-top be more consistent with scanning (I sent in  for Libre 3 to his pharmacy), before meals and before bed, and to call the clinic if he has readings less than 70 or greater than 300 for 3 tests in a row.    - he is warned not to take insulin  without proper monitoring per orders. - Adjustment parameters are given to him for hypo and hyperglycemia in writing.  - he will be considered for incretin therapy as appropriate next  visit.  He could not afford the copay for GLP1 product.  - Specific targets for  A1c;  LDL, HDL,  and Triglycerides were discussed with the patient.  2) Blood Pressure /Hypertension:  his blood pressure is controlled to target. There is documentation of white coat syndrome.  he is advised to continue his current medications including Lisinopril  10 mg p.o. daily with breakfast.  3) Lipids/Hyperlipidemia:    Review of his recent lipid panel from 11/19/22 showed controlled LDL at 53 and elevated triglycerides of 194 .  he  is advised to continue Simvastatin  80 mg daily at bedtime.  Side effects and precautions discussed with him.    4)  Weight/Diet:  his Body mass index is 35.76 kg/m.  -  clearly complicating his diabetes care.   he is candidate for weight loss. I discussed with him the fact that loss of 5 - 10% of his  current body weight will have the most impact on his diabetes management.  Exercise, and detailed carbohydrates information provided  -  detailed on discharge instructions.  5) Chronic Care/Health Maintenance: -he is on ACEI/ARB and Statin medications and is encouraged to initiate and continue to follow up with Ophthalmology, Dentist, Podiatrist at least yearly or according to recommendations, and advised to stay away from smoking. I have recommended yearly flu vaccine and pneumonia vaccine at least every 5 years; moderate intensity exercise for up to 150 minutes weekly; and sleep for at least 7 hours a day.  - he is advised to maintain close follow up with Pcp, No for primary care needs, as well as his other providers for optimal and coordinated care.     I spent  49  minutes in the care of the patient today including review of labs from CMP, Lipids, Thyroid  Function, Hematology (current and previous including abstractions from other facilities); face-to-face time discussing  his blood glucose readings/logs, discussing hypoglycemia and hyperglycemia episodes and symptoms,  medications doses, his options of short and long term treatment based on the latest standards of care / guidelines;  discussion about incorporating lifestyle medicine;  and documenting the encounter. Risk reduction counseling performed per USPSTF guidelines to reduce obesity and cardiovascular risk factors.     Please refer to Patient Instructions for Blood Glucose Monitoring and Insulin /Medications Dosing Guide"  in media tab for additional information. Please  also refer to " Patient Self Inventory" in the Media  tab for reviewed elements of pertinent patient history.  Chad Haynes participated in the discussions, expressed understanding, and voiced agreement with the above plans.  All questions were answered to his satisfaction. he is encouraged to contact clinic should he have any questions or concerns prior to his return visit.   Follow up plan: - Return in about 3 months (around 08/14/2023) for Diabetes F/U with A1c in office, No previsit labs, Bring meter and logs.   Hulon Magic, [facility] [address] [place], [place] [address] Phone: [phone] Fax: [phone]  05/14/2023, 10:43 AM

## 2023-05-17 NOTE — Telephone Encounter (Signed)
 He was paying for them out of pocket I think.

## 2023-06-20 ENCOUNTER — Other Ambulatory Visit: Payer: Self-pay | Admitting: Nurse Practitioner

## 2023-06-25 ENCOUNTER — Other Ambulatory Visit: Payer: Self-pay | Admitting: Nurse Practitioner

## 2023-08-02 ENCOUNTER — Other Ambulatory Visit: Payer: Self-pay | Admitting: Nurse Practitioner

## 2023-08-02 DIAGNOSIS — E1165 Type 2 diabetes mellitus with hyperglycemia: Secondary | ICD-10-CM

## 2023-08-03 NOTE — Telephone Encounter (Signed)
 Left a message requesting pt return call to the office.

## 2023-08-04 MED ORDER — DEXCOM G7 SENSOR MISC: 2 refills | Status: DC

## 2023-08-10 ENCOUNTER — Telehealth: Payer: Self-pay | Admitting: *Deleted

## 2023-08-10 NOTE — Telephone Encounter (Signed)
 Patient left a message that his Dexcom  is not covered by his insurance, He ask for a call back.  Patient was called back and a message was left for him. I ask if he new what CGM his insurance may cover? To call us  back.

## 2023-08-19 ENCOUNTER — Other Ambulatory Visit: Payer: Self-pay | Admitting: Nurse Practitioner

## 2023-08-19 ENCOUNTER — Encounter: Payer: Self-pay | Admitting: Nurse Practitioner

## 2023-08-19 ENCOUNTER — Ambulatory Visit (INDEPENDENT_AMBULATORY_CARE_PROVIDER_SITE_OTHER): Admitting: Nurse Practitioner

## 2023-08-19 VITALS — BP 124/74 | HR 85 | Ht 70.0 in | Wt 249.8 lb

## 2023-08-19 DIAGNOSIS — Z7984 Long term (current) use of oral hypoglycemic drugs: Secondary | ICD-10-CM

## 2023-08-19 DIAGNOSIS — E782 Mixed hyperlipidemia: Secondary | ICD-10-CM

## 2023-08-19 DIAGNOSIS — E559 Vitamin D deficiency, unspecified: Secondary | ICD-10-CM

## 2023-08-19 DIAGNOSIS — E1165 Type 2 diabetes mellitus with hyperglycemia: Secondary | ICD-10-CM | POA: Diagnosis not present

## 2023-08-19 DIAGNOSIS — I10 Essential (primary) hypertension: Secondary | ICD-10-CM

## 2023-08-19 DIAGNOSIS — Z794 Long term (current) use of insulin: Secondary | ICD-10-CM

## 2023-08-19 LAB — POCT GLYCOSYLATED HEMOGLOBIN (HGB A1C): Hemoglobin A1C: 11.9 % — AB (ref 4.0–5.6)

## 2023-08-19 LAB — POCT UA - MICROALBUMIN

## 2023-08-19 MED ORDER — TRESIBA FLEXTOUCH 100 UNIT/ML ~~LOC~~ SOPN: 50.0000 [IU] | PEN_INJECTOR | Freq: Every day | SUBCUTANEOUS | Status: DC

## 2023-08-19 MED ORDER — GLIPIZIDE ER 5 MG PO TB24: 5.0000 mg | ORAL_TABLET | Freq: Every day | ORAL | 3 refills | Status: AC

## 2023-08-19 MED ORDER — INSULIN ASPART 100 UNIT/ML IJ SOLN: INTRAMUSCULAR | 3 refills | Status: DC

## 2023-08-19 MED ORDER — OZEMPIC (0.25 OR 0.5 MG/DOSE) 2 MG/3ML ~~LOC~~ SOPN
PEN_INJECTOR | SUBCUTANEOUS | 1 refills | Status: DC
Start: 2023-08-19 — End: 2023-10-13

## 2023-08-19 MED ORDER — FREESTYLE LIBRE 3 PLUS SENSOR MISC: 3 refills | Status: AC

## 2023-08-19 MED ORDER — CEQUR SIMPLICITY 2U DEVI: 6 refills | Status: DC

## 2023-08-19 MED ORDER — METFORMIN HCL 1000 MG PO TABS: 1000.0000 mg | ORAL_TABLET | Freq: Two times a day (BID) | ORAL | 3 refills | Status: AC

## 2023-08-19 NOTE — Progress Notes (Signed)
 Endocrinology Follow Up Note       08/19/2023, 11:41 AM   Subjective:    Patient ID: Chad Haynes, male    DOB: [birth year].  Chad Haynes is being seen in follow up after being seen in consultation for management of currently uncontrolled symptomatic diabetes requested by  Pcp, No.   Past Medical History:  Diagnosis Date   Depression    Diabetes mellitus without complication (HCC)    Diabetes mellitus, type II (HCC)    GERD (gastroesophageal reflux disease)    H. pylori infection    HTN (hypertension)    Hyperlipidemia    Hypogonadism male    Leg pain    Low magnesium  levels    Neck pain    Rhinitis, allergic    Vitamin B12 deficiency    Vitamin D  deficiency    White coat hypertension     Past Surgical History:  Procedure Laterality Date   KNEE ARTHROSCOPY Right     Social History   Socioeconomic History   Marital status: Married    Spouse name: Not on file   Number of children: Not on file   Years of education: Not on file   Highest education level: Not on file  Occupational History   Not on file  Tobacco Use   Smoking status: Former    Current packs/day: 0.00    Types: Cigarettes    Quit date: 06/08/1991    Years since quitting: 32.2   Smokeless tobacco: Never  Vaping Use   Vaping status: Never Used  Substance and Sexual Activity   Alcohol  use: Yes    Alcohol /week: 0.0 standard drinks of alcohol     Comment: occasionally   Drug use: No   Sexual activity: Yes  Other Topics Concern   Not on file  Social History Narrative   Married. No children. Does not formal exercise.   In school at [employer] full-time. Works part-time job at night.   Has to walk a mile from the car to classic [employer]   Social Drivers of Health   Financial Resource Strain: Not on file  Food Insecurity: Not on file  Transportation Needs: Not on file  Physical Activity: Not on file  Stress: Not on file   Social Connections: Not on file    Family History  Problem Relation Age of Onset   COPD Mother        smoker   Heart attack Father    Hypertension Father     Outpatient Encounter Medications as of 08/19/2023  Medication Sig   BD PEN NEEDLE NANO 2ND GEN 32G X 4 MM MISC USE TO INJECT INSULIN  4 TIMES DAILY   Continuous Glucose Sensor (FREESTYLE LIBRE 3 PLUS SENSOR) MISC Change sensor every 15 days.   Injection Device for Insulin  (CEQUR SIMPLICITY INSERTER) MISC Use to apply Cequr device.   Insulin  Syringe-Needle U-100 (BD INSULIN  SYRINGE U/F) 31G X 5/16 0.3 ML MISC USE WITH SLIDING SCALE INSULIN    lisinopril  (ZESTRIL ) 10 MG tablet Take 1 tablet (10 mg total) by mouth daily.   metoprolol  succinate (TOPROL -XL) 25 MG 24 hr tablet Take 1 tablet (25 mg total) by mouth daily.  omeprazole  (PRILOSEC OTC) 20 MG tablet Take 20 mg by mouth daily.   PARoxetine  (PAXIL ) 20 MG tablet Take 1 tablet (20 mg total) by mouth daily.   rosuvastatin  (CRESTOR ) 40 MG tablet Take 1 tablet (40 mg total) by mouth daily.   Semaglutide ,0.25 or 0.5MG /DOS, (OZEMPIC , 0.25 OR 0.5 MG/DOSE,) 2 MG/3ML SOPN Inject 0.25 mg SQ weekly x 2 doses, then increase to 0.5 mg SQ weekly thereafter.   Vitamin D , Ergocalciferol , (DRISDOL ) 1.25 MG (50000 UNIT) CAPS capsule Take 1 capsule (50,000 Units total) by mouth every 7 (seven) days.   [DISCONTINUED] CEQUR SIMPLICITY 2U DEVI USE TO INJECT INSULIN  TID WITH MEALS, CHANGE EVERY 2-3 DAYS   [DISCONTINUED] glipiZIDE  (GLUCOTROL  XL) 5 MG 24 hr tablet Take 1 tablet (5 mg total) by mouth daily with breakfast.   [DISCONTINUED] insulin  aspart (NOVOLOG ) 100 UNIT/ML injection Use with Cequr for TDD around 40 units per day   [DISCONTINUED] insulin  degludec (TRESIBA  FLEXTOUCH) 100 UNIT/ML FlexTouch Pen INJECT 50 UNITS INTO THE SKIN AT BEDTIME.   [DISCONTINUED] insulin  glargine (LANTUS ) 100 unit/mL SOPN Inject 50 Units into the skin daily.   [DISCONTINUED] metFORMIN  (GLUCOPHAGE ) 1000 MG tablet Take 1  tablet (1,000 mg total) by mouth 2 (two) times daily with a meal.   glipiZIDE  (GLUCOTROL  XL) 5 MG 24 hr tablet Take 1 tablet (5 mg total) by mouth daily with breakfast.   injection device for insulin  (CEQUR SIMPLICITY 2U) DEVI Use to inject insulin  TID with meals, change every 2-3 days   insulin  aspart (NOVOLOG ) 100 UNIT/ML injection Use with Cequr for TDD around 40 units per day   insulin  degludec (TRESIBA  FLEXTOUCH) 100 UNIT/ML FlexTouch Pen Inject 50 Units into the skin at bedtime.   metFORMIN  (GLUCOPHAGE ) 1000 MG tablet Take 1 tablet (1,000 mg total) by mouth 2 (two) times daily with a meal.   [DISCONTINUED] Continuous Glucose Sensor (DEXCOM G7 SENSOR) MISC USE TO MONITOR GLUCOSE CONTINUOUSLY AS INSTRUCTED (Patient not taking: Reported on 08/19/2023)   [DISCONTINUED] Continuous Glucose Sensor (FREESTYLE LIBRE 3 SENSOR) MISC PLACE 1 SENSOR ON THE SKIN EVERY 14 DAYS. USE TO CHECK GLUCOSE CONTINUOUSLY (Patient not taking: Reported on 08/19/2023)   No facility-administered encounter medications on file as of 08/19/2023.    ALLERGIES: No Known Allergies  VACCINATION STATUS: Immunization History  Administered Date(s) Administered   PFIZER(Purple Top)SARS-COV-2 Vaccination 04/13/2019, 05/13/2019, 12/17/2019   Pneumococcal Polysaccharide-23 01/17/2014   Tdap 05/07/2016    Diabetes He presents for his follow-up diabetic visit. He has type 2 diabetes mellitus. Onset time: was diagnosed at approx age of 70. His disease course has been worsening. There are no hypoglycemic associated symptoms. Associated symptoms include blurred vision, fatigue, polydipsia and polyuria. Pertinent negatives for diabetes include no weight loss. Symptoms are stable. Diabetic complications include impotence. (Recently had EKG which showed infarct of indeterminate age (prompting referral to cardiology)) Risk factors for coronary artery disease include diabetes mellitus, dyslipidemia, family history, hypertension, male sex,  obesity and sedentary lifestyle. Current diabetic treatment includes intensive insulin  program and oral agent (dual therapy). He is compliant with treatment some of the time (has trouble remembering his insulin  at times). His weight is fluctuating minimally. When asked about meal planning, he reported none. He has had a previous visit with a dietitian (Did not find it beneficial). He rarely participates in exercise. (He presents today with no meter or logs to review.  He notes insurance was giving him a hard time about coverage for CGM.  His POCT A1c today is 11.9%, essentially  unchanged from previous visit.  He admits to taking 16 units of insulin  with meals without knowing his sugar readings.  He is interested in retrying Ozempic  (could not afford it previously). ) An ACE inhibitor/angiotensin II receptor blocker is being taken. He does not see a podiatrist.Eye exam is current.  Hyperlipidemia This is a chronic problem. The current episode started more than 1 year ago. The problem is controlled. Recent lipid tests were reviewed and are normal. Exacerbating diseases include diabetes and obesity. Factors aggravating his hyperlipidemia include fatty foods. Current antihyperlipidemic treatment includes statins. The current treatment provides moderate improvement of lipids. Compliance problems include adherence to diet and adherence to exercise.  Risk factors for coronary artery disease include diabetes mellitus, dyslipidemia, hypertension, male sex, obesity and a sedentary lifestyle.  Hypertension This is a chronic problem. The current episode started more than 1 year ago. The problem has been gradually improving since onset. The problem is controlled. Associated symptoms include blurred vision. There are no associated agents to hypertension. Risk factors for coronary artery disease include diabetes mellitus, dyslipidemia, family history, male gender, obesity and sedentary lifestyle. Past treatments include ACE  inhibitors. The current treatment provides mild improvement. Compliance problems include diet and exercise.      Review of systems  Constitutional: +minimally fluctuating body weight,  current Body mass index is 35.84 kg/m. , no fatigue, no subjective hyperthermia, no subjective hypothermia Eyes: no blurry vision, no xerophthalmia ENT: no sore throat, no nodules palpated in throat, no dysphagia/odynophagia, no hoarseness Cardiovascular: no chest pain, no shortness of breath, no palpitations, no leg swelling Respiratory: no cough, no shortness of breath Gastrointestinal: no nausea/vomiting/diarrhea Musculoskeletal: no muscle/joint aches Skin: no rashes, no hyperemia Neurological: no tremors, no numbness, no tingling, no dizziness Psychiatric: no depression, no anxiety  Objective:     BP 124/74 (BP Location: Right Arm, Patient Position: Sitting, Cuff Size: Large)   Pulse 85   Ht 5' 10 (1.778 m)   Wt 249 lb 12.8 oz (113.3 kg)   BMI 35.84 kg/m   Wt Readings from Last 3 Encounters:  08/19/23 249 lb 12.8 oz (113.3 kg)  05/14/23 249 lb 3.2 oz (113 kg)  11/23/22 249 lb 6.4 oz (113.1 kg)     BP Readings from Last 3 Encounters:  08/19/23 124/74  05/14/23 120/82  11/23/22 120/82      Physical Exam- Limited  Constitutional:  Body mass index is 35.84 kg/m. , not in acute distress, normal state of mind Eyes:  EOMI, no exophthalmos Musculoskeletal: no gross deformities, strength intact in all four extremities, no gross restriction of joint movements Skin:  no rashes, no hyperemia Neurological: no tremor with outstretched hands   Diabetic Foot Exam - Simple   Simple Foot Form Diabetic Foot exam was performed with the following findings: Yes 08/19/2023 11:17 AM  Visual Inspection No deformities, no ulcerations, no other skin breakdown bilaterally: Yes Sensation Testing Intact to touch and monofilament testing bilaterally: Yes Pulse Check Posterior Tibialis and Dorsalis pulse  intact bilaterally: Yes Comments     CMP ( most recent) CMP     Component Value Date/Time   NA 143 11/19/2022 0940   K 4.4 11/19/2022 0940   CL 106 11/19/2022 0940   CO2 23 11/19/2022 0940   GLUCOSE 85 11/19/2022 0940   GLUCOSE 243 (H) 03/25/2021 0111   BUN 11 11/19/2022 0940   CREATININE 0.75 (L) 11/19/2022 0940   CREATININE 0.81 03/15/2020 1159   CALCIUM  9.0 11/19/2022 0940   PROT  7.1 11/19/2022 0940   ALBUMIN 4.5 11/19/2022 0940   AST 20 11/19/2022 0940   ALT 17 11/19/2022 0940   ALKPHOS 110 11/19/2022 0940   BILITOT 0.3 11/19/2022 0940   GFRNONAA >60 03/25/2021 0111   GFRNONAA 75 02/27/2019 1602   GFRAA >60 02/28/2019 0927   GFRAA 87 02/27/2019 1602     Diabetic Labs (most recent): Lab Results  Component Value Date   HGBA1C 11.9 (A) 08/19/2023   HGBA1C 11.2 (A) 11/23/2022   HGBA1C 9.3 (A) 10/16/2021   MICROALBUR 80mg /L 08/19/2023   MICROALBUR 80 02/25/2021   MICROALBUR 0.5 07/13/2019     Lipid Panel ( most recent) Lipid Panel     Component Value Date/Time   CHOL 111 11/19/2022 0940   TRIG 194 (H) 11/19/2022 0940   HDL 26 (L) 11/19/2022 0940   CHOLHDL 4.3 11/19/2022 0940   CHOLHDL 7.6 (H) 02/27/2019 1602   VLDL 38 (H) 05/07/2016 0940   LDLCALC 53 11/19/2022 0940   LDLCALC  02/27/2019 1602     Comment:     . LDL cholesterol not calculated. Triglyceride levels greater than 400 mg/dL invalidate calculated LDL results. . Reference range: <100 . Desirable range <100 mg/dL for primary prevention;   <70 mg/dL for patients with CHD or diabetic patients  with > or = 2 CHD risk factors. [facility] LDL-C is now calculated using the Martin-Hopkins  calculation, which is a validated novel method providing  better accuracy than the Friedewald equation in the  estimation of LDL-C.  Gladis APPLETHWAITE et al. SANDREA. 7986;689(80): 2061-2068  (http://education.[facility].com/faq/FAQ164)    LDLDIRECT 68 03/15/2020 1159   LABVLDL 32 11/19/2022 0940      Lab Results   Component Value Date   TSH 0.981 11/19/2022   TSH 0.92 03/10/2018   TSH 0.69 05/07/2016   FREET4 1.10 11/19/2022           Assessment & Plan:   1) Uncontrolled type 2 diabetes mellitus with hyperglycemia (HCC)  - Chad Haynes has currently uncontrolled symptomatic type 2 DM since 58 years of age.SABRA   He presents today with no meter or logs to review.  He notes insurance was giving him a hard time about coverage for CGM.  His POCT A1c today is 11.9%, essentially unchanged from previous visit.  He admits to taking 16 units of insulin  with meals without knowing his sugar readings.  He is interested in retrying Ozempic  (could not afford it previously).   -Recent labs reviewed.   - I had a long discussion with him about the progressive nature of diabetes and the pathology behind its complications. -his diabetes is not currently complicated but he remains at a high risk for more acute and chronic complications which include CAD, CVA, CKD, retinopathy, and neuropathy. These are all discussed in detail with him.  - Nutritional counseling repeated at each appointment due to patients tendency to fall back in to old habits.  - The patient admits there is a room for improvement in their diet and drink choices. -  Suggestion is made for the patient to avoid simple carbohydrates from their diet including Cakes, Sweet Desserts / Pastries, Ice Cream, Soda (diet and regular), Sweet Tea, Candies, Chips, Cookies, Sweet Pastries, Store Bought Juices, Alcohol  in Excess of 1-2 drinks a day, Artificial Sweeteners, Coffee Creamer, and Sugar-free Products. This will help patient to have stable blood glucose profile and potentially avoid unintended weight gain.   - I encouraged the patient to switch to unprocessed  or minimally processed complex starch and increased protein intake (animal or plant source), fruits, and vegetables.   - Patient is advised to stick to a routine mealtimes to eat 3 meals a day  and avoid unnecessary snacks (to snack only to correct hypoglycemia).  - I have approached him with the following individualized plan to manage  his diabetes and patient agrees:   -He is advised to continue Lantus  50 units SQ daily at bedtime, Glipizide  5 mg XL daily with breakfast, and Metformin  1000 mg po twice daily with meals. I advised to decrease Cequr insulin  dosing to 10 units prior to each meal to help prevent hypoglycemia since he has not checked his sugar recently.  I did re-initiate Ozempic  0.25 mg SQ weekly x 2 weeks, then increase to 0.5 mg SQ weekly thereafter if tolerated well.  I gave sample to get him started.  -he is encouraged to restart using CGM to continue monitoring blood glucose 4 times daily-top be more consistent with scanning (I sent in for [place] 3 plus to his pharmacy).  - he is warned not to take insulin  without proper monitoring per orders. - Adjustment parameters are given to him for hypo and hyperglycemia in writing.  - he will be considered for incretin therapy as appropriate next visit.  He could not afford the copay for GLP1 product.  - Specific targets for  A1c;  LDL, HDL,  and Triglycerides were discussed with the patient.  2) Blood Pressure /Hypertension:  his blood pressure is controlled to target. There is documentation of white coat syndrome.  he is advised to continue his current medications as prescribed by PCP.  3) Lipids/Hyperlipidemia:    Review of his recent lipid panel from 11/19/22 showed controlled LDL at 53 and elevated triglycerides of 194 .  he  is advised to continue Simvastatin  80 mg daily at bedtime.  Side effects and precautions discussed with him.  Will recheck lipid panel prior to next visit.  Will recheck lipid panel prior to next visit.  4)  Weight/Diet:  his Body mass index is 35.84 kg/m.  -  clearly complicating his diabetes care.   he is candidate for weight loss. I discussed with him the fact that loss of 5 - 10% of his  current  body weight will have the most impact on his diabetes management.  Exercise, and detailed carbohydrates information provided  -  detailed on discharge instructions.  5) Chronic Care/Health Maintenance: -he is on ACEI/ARB and Statin medications and is encouraged to initiate and continue to follow up with Ophthalmology, Dentist, Podiatrist at least yearly or according to recommendations, and advised to stay away from smoking. I have recommended yearly flu vaccine and pneumonia vaccine at least every 5 years; moderate intensity exercise for up to 150 minutes weekly; and sleep for at least 7 hours a day.  - he is advised to maintain close follow up with Pcp, No for primary care needs, as well as his other providers for optimal and coordinated care.     I spent  49  minutes in the care of the patient today including review of labs from CMP, Lipids, Thyroid  Function, Hematology (current and previous including abstractions from other facilities); face-to-face time discussing  his blood glucose readings/logs, discussing hypoglycemia and hyperglycemia episodes and symptoms, medications doses, his options of short and long term treatment based on the latest standards of care / guidelines;  discussion about incorporating lifestyle medicine;  and documenting the encounter. Risk reduction counseling  performed per USPSTF guidelines to reduce obesity and cardiovascular risk factors.     Please refer to Patient Instructions for Blood Glucose Monitoring and Insulin /Medications Dosing Guide  in media tab for additional information. Please  also refer to  Patient Self Inventory in the Media  tab for reviewed elements of pertinent patient history.  Chad Haynes participated in the discussions, expressed understanding, and voiced agreement with the above plans.  All questions were answered to his satisfaction. he is encouraged to contact clinic should he have any questions or concerns prior to his return  visit.   Follow up plan: - Return in about 3 months (around 11/19/2023) for Diabetes F/U with A1c in office, No previsit labs, Bring meter and logs.  Benton Rio, [facility] [address] [place], [place] [address] Phone: [phone] Fax: [phone]  08/19/2023, 11:41 AM

## 2023-08-20 NOTE — Telephone Encounter (Signed)
 Chad Haynes can call his insurance to find out which are covered at lowest cost.  We cannot keep blindly sending in scripts in hopes that insurance will cover.

## 2023-08-20 NOTE — Telephone Encounter (Signed)
 I think he was paying for the Cequr out of pocket.  As far as the Tresiba  and Novolog  goes, we can switch to whatever is preferred by his insurance.  He needs to call and find out which ones.

## 2023-09-07 DIAGNOSIS — E119 Type 2 diabetes mellitus without complications: Secondary | ICD-10-CM | POA: Diagnosis not present

## 2023-09-07 DIAGNOSIS — I10 Essential (primary) hypertension: Secondary | ICD-10-CM | POA: Diagnosis not present

## 2023-09-07 DIAGNOSIS — Z6835 Body mass index (BMI) 35.0-35.9, adult: Secondary | ICD-10-CM | POA: Diagnosis not present

## 2023-09-07 DIAGNOSIS — Z125 Encounter for screening for malignant neoplasm of prostate: Secondary | ICD-10-CM | POA: Diagnosis not present

## 2023-09-07 DIAGNOSIS — R5383 Other fatigue: Secondary | ICD-10-CM | POA: Diagnosis not present

## 2023-09-07 DIAGNOSIS — Z794 Long term (current) use of insulin: Secondary | ICD-10-CM | POA: Diagnosis not present

## 2023-09-07 DIAGNOSIS — E7800 Pure hypercholesterolemia, unspecified: Secondary | ICD-10-CM | POA: Diagnosis not present

## 2023-09-07 DIAGNOSIS — E559 Vitamin D deficiency, unspecified: Secondary | ICD-10-CM | POA: Diagnosis not present

## 2023-09-07 DIAGNOSIS — Z0000 Encounter for general adult medical examination without abnormal findings: Secondary | ICD-10-CM | POA: Diagnosis not present

## 2023-09-21 DIAGNOSIS — Z794 Long term (current) use of insulin: Secondary | ICD-10-CM | POA: Diagnosis not present

## 2023-09-21 DIAGNOSIS — E559 Vitamin D deficiency, unspecified: Secondary | ICD-10-CM | POA: Diagnosis not present

## 2023-09-21 DIAGNOSIS — E119 Type 2 diabetes mellitus without complications: Secondary | ICD-10-CM | POA: Diagnosis not present

## 2023-09-21 DIAGNOSIS — I10 Essential (primary) hypertension: Secondary | ICD-10-CM | POA: Diagnosis not present

## 2023-09-21 DIAGNOSIS — E7800 Pure hypercholesterolemia, unspecified: Secondary | ICD-10-CM | POA: Diagnosis not present

## 2023-10-01 NOTE — Telephone Encounter (Signed)
 Have Chad Haynes reach out to his insurance or pharmacy to find out why, as it was covered previously.

## 2023-10-07 ENCOUNTER — Other Ambulatory Visit: Payer: Self-pay | Admitting: Nurse Practitioner

## 2023-10-07 ENCOUNTER — Telehealth: Payer: Self-pay | Admitting: *Deleted

## 2023-10-07 ENCOUNTER — Other Ambulatory Visit: Payer: Self-pay | Admitting: *Deleted

## 2023-10-07 DIAGNOSIS — E66811 Obesity, class 1: Secondary | ICD-10-CM

## 2023-10-07 DIAGNOSIS — Z794 Long term (current) use of insulin: Secondary | ICD-10-CM

## 2023-10-07 DIAGNOSIS — I25119 Atherosclerotic heart disease of native coronary artery with unspecified angina pectoris: Secondary | ICD-10-CM

## 2023-10-07 DIAGNOSIS — Z7984 Long term (current) use of oral hypoglycemic drugs: Secondary | ICD-10-CM

## 2023-10-07 DIAGNOSIS — E782 Mixed hyperlipidemia: Secondary | ICD-10-CM

## 2023-10-07 DIAGNOSIS — E1165 Type 2 diabetes mellitus with hyperglycemia: Secondary | ICD-10-CM

## 2023-10-07 DIAGNOSIS — I2510 Atherosclerotic heart disease of native coronary artery without angina pectoris: Secondary | ICD-10-CM

## 2023-10-07 DIAGNOSIS — I10 Essential (primary) hypertension: Secondary | ICD-10-CM

## 2023-10-07 DIAGNOSIS — E559 Vitamin D deficiency, unspecified: Secondary | ICD-10-CM

## 2023-10-07 MED ORDER — INSULIN ASPART 100 UNIT/ML IJ SOLN: INTRAMUSCULAR | 3 refills | Status: DC

## 2023-10-07 MED ORDER — CEQUR SIMPLICITY 2U DEVI: 6 refills | Status: AC

## 2023-10-07 MED ORDER — INSULIN DEGLUDEC FLEXTOUCH 100 UNIT/ML ~~LOC~~ SOPN: 50.0000 [IU] | PEN_INJECTOR | Freq: Every day | SUBCUTANEOUS | 3 refills | Status: AC

## 2023-10-07 NOTE — Telephone Encounter (Signed)
 The prescriptions were sent back to the pharmacy a second time.

## 2023-10-08 ENCOUNTER — Other Ambulatory Visit: Payer: Self-pay | Admitting: Nurse Practitioner

## 2023-10-08 DIAGNOSIS — E559 Vitamin D deficiency, unspecified: Secondary | ICD-10-CM

## 2023-10-08 DIAGNOSIS — E1165 Type 2 diabetes mellitus with hyperglycemia: Secondary | ICD-10-CM

## 2023-10-08 DIAGNOSIS — E66811 Obesity, class 1: Secondary | ICD-10-CM

## 2023-10-08 DIAGNOSIS — Z794 Long term (current) use of insulin: Secondary | ICD-10-CM

## 2023-10-08 DIAGNOSIS — I2510 Atherosclerotic heart disease of native coronary artery without angina pectoris: Secondary | ICD-10-CM

## 2023-10-08 DIAGNOSIS — I25119 Atherosclerotic heart disease of native coronary artery with unspecified angina pectoris: Secondary | ICD-10-CM

## 2023-10-08 DIAGNOSIS — I10 Essential (primary) hypertension: Secondary | ICD-10-CM

## 2023-10-08 DIAGNOSIS — Z7984 Long term (current) use of oral hypoglycemic drugs: Secondary | ICD-10-CM

## 2023-10-08 DIAGNOSIS — E782 Mixed hyperlipidemia: Secondary | ICD-10-CM

## 2023-10-11 NOTE — Telephone Encounter (Signed)
 Have patient call his insurance to find out what long acting insulins are covered.  They did not give us  an alternative list.

## 2023-10-11 NOTE — Telephone Encounter (Signed)
 Left a message requesting pt return call to the office.

## 2023-10-13 ENCOUNTER — Telehealth: Payer: Self-pay

## 2023-10-13 MED ORDER — SEMAGLUTIDE (1 MG/DOSE) 4 MG/3ML ~~LOC~~ SOPN: 1.0000 mg | PEN_INJECTOR | SUBCUTANEOUS | 1 refills | Status: AC

## 2023-10-13 NOTE — Telephone Encounter (Signed)
 Spoke with pt advising him a Rx for Ozempic  1mg  weekly has been sent to his pharmacy and to finish the supply of 0.5mg  dose he has first per Whitney Reardon,FNP. Pt voiced understanding.

## 2023-10-13 NOTE — Telephone Encounter (Signed)
 Pt called back and lvm to call him back

## 2023-10-13 NOTE — Telephone Encounter (Signed)
 Spoke with pt he stated he has been injecting Ozempic  0.5mg  weekly x 5 weeks and has tolerated the dose well. Requested a Rx for an increased dose.

## 2023-10-13 NOTE — Telephone Encounter (Signed)
 That's fine.  I will send in for the 1 mg dose, have him finish his supply of 0.5 mg before advancing.

## 2023-10-13 NOTE — Telephone Encounter (Signed)
 Spoke with pt advising him to contact his insurance company to confirm which basal insulin  they will cover. Pt voiced understanding and agreed to contact ins. Company.

## 2023-11-19 ENCOUNTER — Ambulatory Visit: Admitting: Nurse Practitioner

## 2023-11-19 DIAGNOSIS — E559 Vitamin D deficiency, unspecified: Secondary | ICD-10-CM

## 2023-11-19 DIAGNOSIS — E782 Mixed hyperlipidemia: Secondary | ICD-10-CM

## 2023-11-19 DIAGNOSIS — Z7984 Long term (current) use of oral hypoglycemic drugs: Secondary | ICD-10-CM

## 2023-11-19 DIAGNOSIS — I10 Essential (primary) hypertension: Secondary | ICD-10-CM

## 2023-11-19 DIAGNOSIS — Z794 Long term (current) use of insulin: Secondary | ICD-10-CM

## 2023-12-20 ENCOUNTER — Other Ambulatory Visit: Payer: Self-pay | Admitting: Nurse Practitioner

## 2023-12-20 DIAGNOSIS — E1165 Type 2 diabetes mellitus with hyperglycemia: Secondary | ICD-10-CM

## 2023-12-20 DIAGNOSIS — E782 Mixed hyperlipidemia: Secondary | ICD-10-CM

## 2023-12-20 DIAGNOSIS — I25119 Atherosclerotic heart disease of native coronary artery with unspecified angina pectoris: Secondary | ICD-10-CM

## 2023-12-20 DIAGNOSIS — Z7984 Long term (current) use of oral hypoglycemic drugs: Secondary | ICD-10-CM

## 2023-12-20 DIAGNOSIS — I10 Essential (primary) hypertension: Secondary | ICD-10-CM

## 2023-12-20 DIAGNOSIS — E66811 Obesity, class 1: Secondary | ICD-10-CM

## 2023-12-20 DIAGNOSIS — E559 Vitamin D deficiency, unspecified: Secondary | ICD-10-CM

## 2023-12-20 DIAGNOSIS — I2510 Atherosclerotic heart disease of native coronary artery without angina pectoris: Secondary | ICD-10-CM

## 2023-12-20 DIAGNOSIS — Z794 Long term (current) use of insulin: Secondary | ICD-10-CM

## 2024-01-21 ENCOUNTER — Encounter: Payer: Self-pay | Admitting: Internal Medicine

## 2024-01-28 ENCOUNTER — Ambulatory Visit: Admitting: Nurse Practitioner

## 2024-01-28 DIAGNOSIS — E782 Mixed hyperlipidemia: Secondary | ICD-10-CM

## 2024-01-28 DIAGNOSIS — I10 Essential (primary) hypertension: Secondary | ICD-10-CM

## 2024-01-28 DIAGNOSIS — E1165 Type 2 diabetes mellitus with hyperglycemia: Secondary | ICD-10-CM

## 2024-01-28 DIAGNOSIS — E559 Vitamin D deficiency, unspecified: Secondary | ICD-10-CM

## 2024-01-28 DIAGNOSIS — Z7984 Long term (current) use of oral hypoglycemic drugs: Secondary | ICD-10-CM

## 2024-01-28 DIAGNOSIS — Z794 Long term (current) use of insulin: Secondary | ICD-10-CM
